# Patient Record
Sex: Female | Born: 1955
Health system: Southern US, Community
[De-identification: ages and names within clinical notes are randomized; demographics above are authoritative.]

## PROBLEM LIST (undated history)

## (undated) DIAGNOSIS — R519 Headache, unspecified: Secondary | ICD-10-CM

## (undated) DIAGNOSIS — K219 Gastro-esophageal reflux disease without esophagitis: Secondary | ICD-10-CM

## (undated) DIAGNOSIS — I2510 Atherosclerotic heart disease of native coronary artery without angina pectoris: Secondary | ICD-10-CM

## (undated) DIAGNOSIS — N63 Unspecified lump in unspecified breast: Secondary | ICD-10-CM

## (undated) DIAGNOSIS — M810 Age-related osteoporosis without current pathological fracture: Secondary | ICD-10-CM

## (undated) DIAGNOSIS — G8929 Other chronic pain: Secondary | ICD-10-CM

## (undated) DIAGNOSIS — R7303 Prediabetes: Secondary | ICD-10-CM

## (undated) DIAGNOSIS — M797 Fibromyalgia: Secondary | ICD-10-CM

## (undated) DIAGNOSIS — E559 Vitamin D deficiency, unspecified: Secondary | ICD-10-CM

## (undated) DIAGNOSIS — E785 Hyperlipidemia, unspecified: Secondary | ICD-10-CM

## (undated) DIAGNOSIS — R2689 Other abnormalities of gait and mobility: Secondary | ICD-10-CM

## (undated) DIAGNOSIS — N8110 Cystocele, unspecified: Secondary | ICD-10-CM

## (undated) DIAGNOSIS — M1990 Unspecified osteoarthritis, unspecified site: Secondary | ICD-10-CM

## (undated) DIAGNOSIS — G4700 Insomnia, unspecified: Secondary | ICD-10-CM

## (undated) DIAGNOSIS — R202 Paresthesia of skin: Secondary | ICD-10-CM

## (undated) DIAGNOSIS — R51 Headache: Secondary | ICD-10-CM

## (undated) DIAGNOSIS — I10 Essential (primary) hypertension: Secondary | ICD-10-CM

## (undated) DIAGNOSIS — H409 Unspecified glaucoma: Secondary | ICD-10-CM

## (undated) HISTORY — DX: Prediabetes: R73.03

## (undated) HISTORY — DX: Vitamin D deficiency, unspecified: E55.9

## (undated) HISTORY — PX: OVARIAN CYST SURGERY: SHX726

## (undated) HISTORY — DX: Other chronic pain: G89.29

## (undated) HISTORY — DX: Fibromyalgia: M79.7

## (undated) HISTORY — DX: Insomnia, unspecified: G47.00

## (undated) HISTORY — DX: Hyperlipidemia, unspecified: E78.5

## (undated) HISTORY — DX: Other abnormalities of gait and mobility: R26.89

## (undated) HISTORY — DX: Unspecified lump in unspecified breast: N63.0

## (undated) HISTORY — PX: APPENDECTOMY: SHX54

## (undated) HISTORY — DX: Cystocele, unspecified: N81.10

## (undated) HISTORY — DX: Gastro-esophageal reflux disease without esophagitis: K21.9

## (undated) HISTORY — DX: Headache, unspecified: R51.9

## (undated) HISTORY — DX: Age-related osteoporosis without current pathological fracture: M81.0

## (undated) HISTORY — PX: BREAST BIOPSY: SHX20

## (undated) HISTORY — DX: Headache: R51

## (undated) HISTORY — DX: Unspecified osteoarthritis, unspecified site: M19.90

## (undated) HISTORY — DX: Unspecified glaucoma: H40.9

## (undated) HISTORY — DX: Paresthesia of skin: R20.2

---

## 2006-10-15 HISTORY — PX: TOTAL VAGINAL HYSTERECTOMY: SHX2548

## 2006-10-15 HISTORY — PX: ABDOMINAL HYSTERECTOMY: SHX81

## 2008-01-22 IMAGING — CR DG ABDOMEN 2V
2 series · 2 of 2 positions shown · non-contrast
Comparison: None

CLINICAL DATA: Vomiting blood.  Evaluate for free air.

ABDOMEN - 2 VIEW

[w abdomen upright]
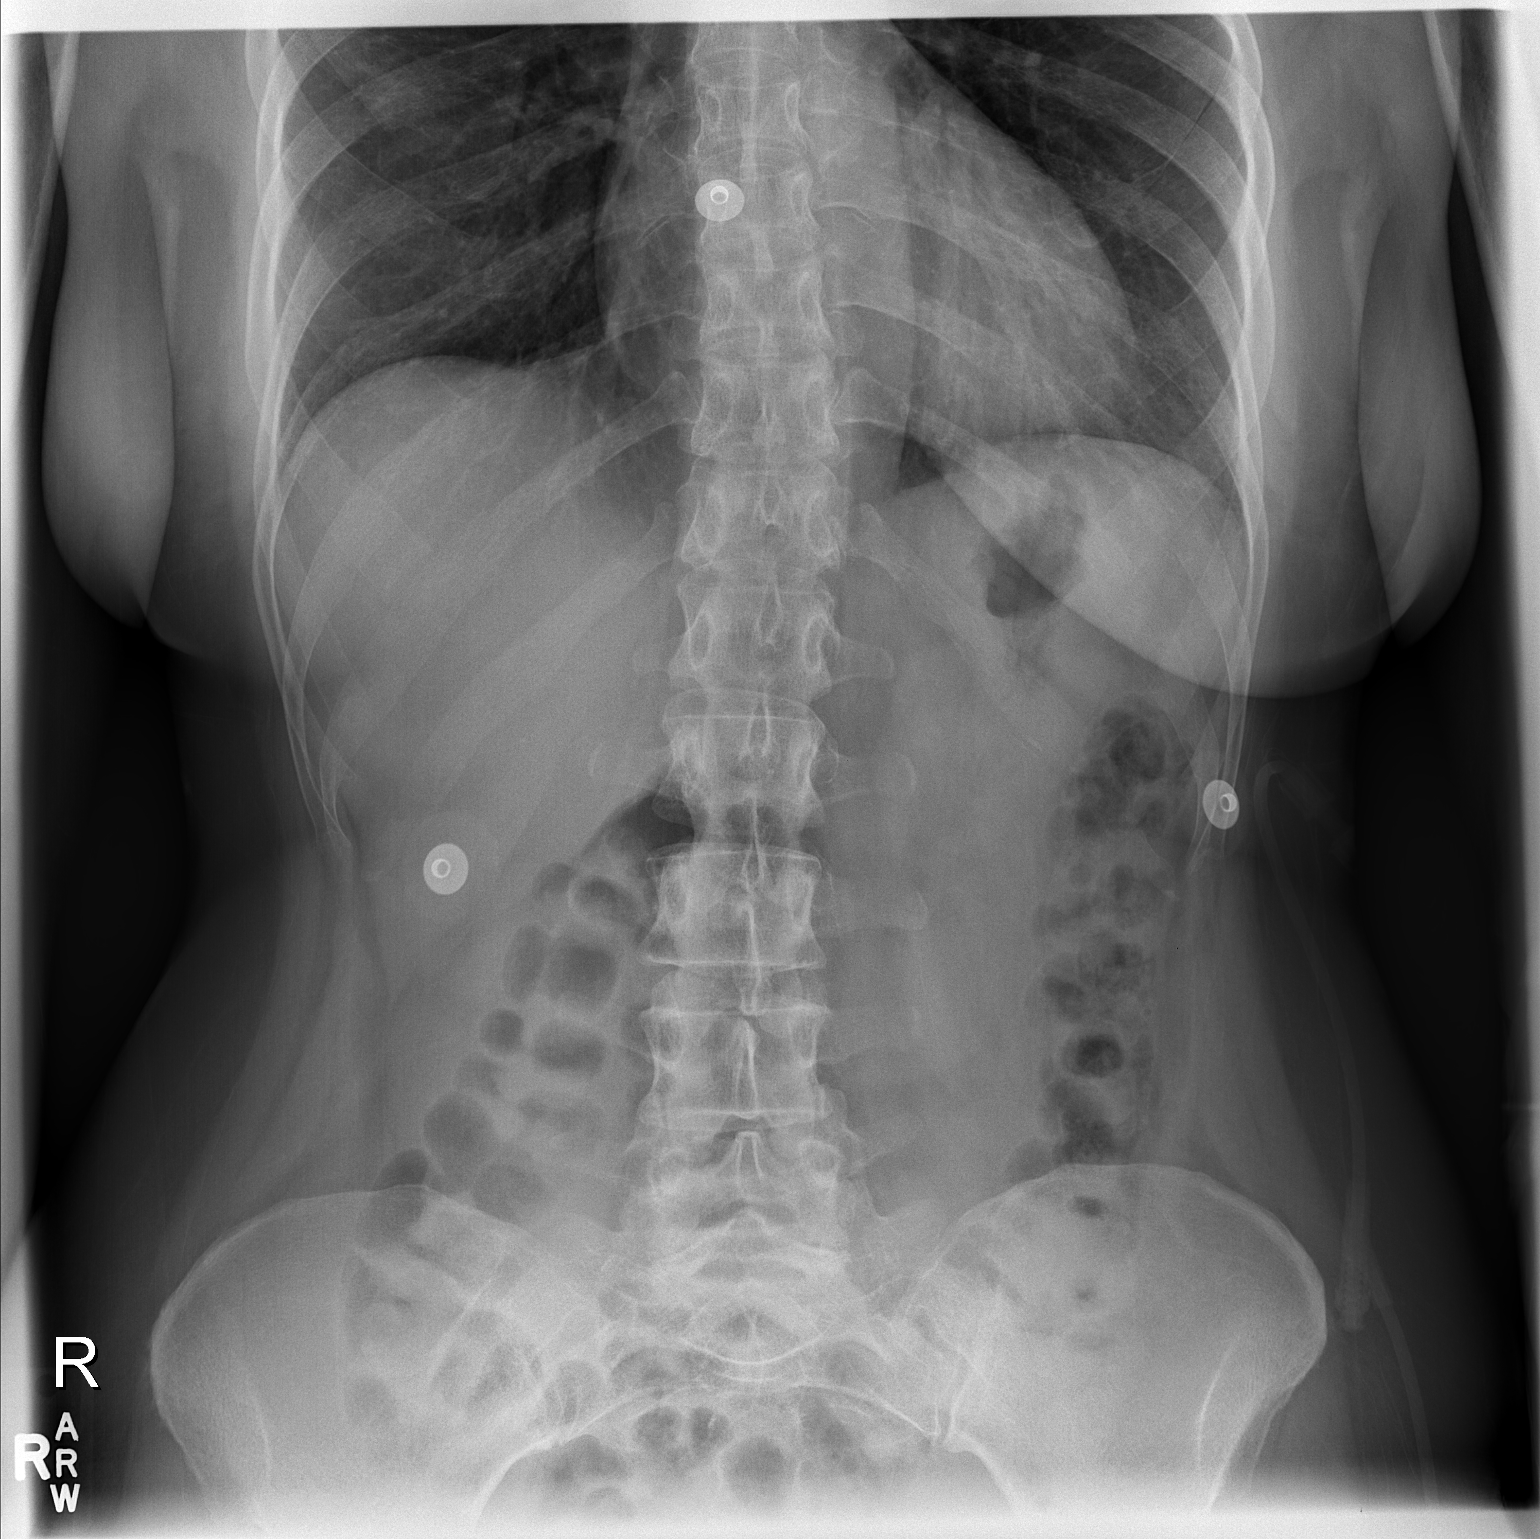

[t abdomen supine]
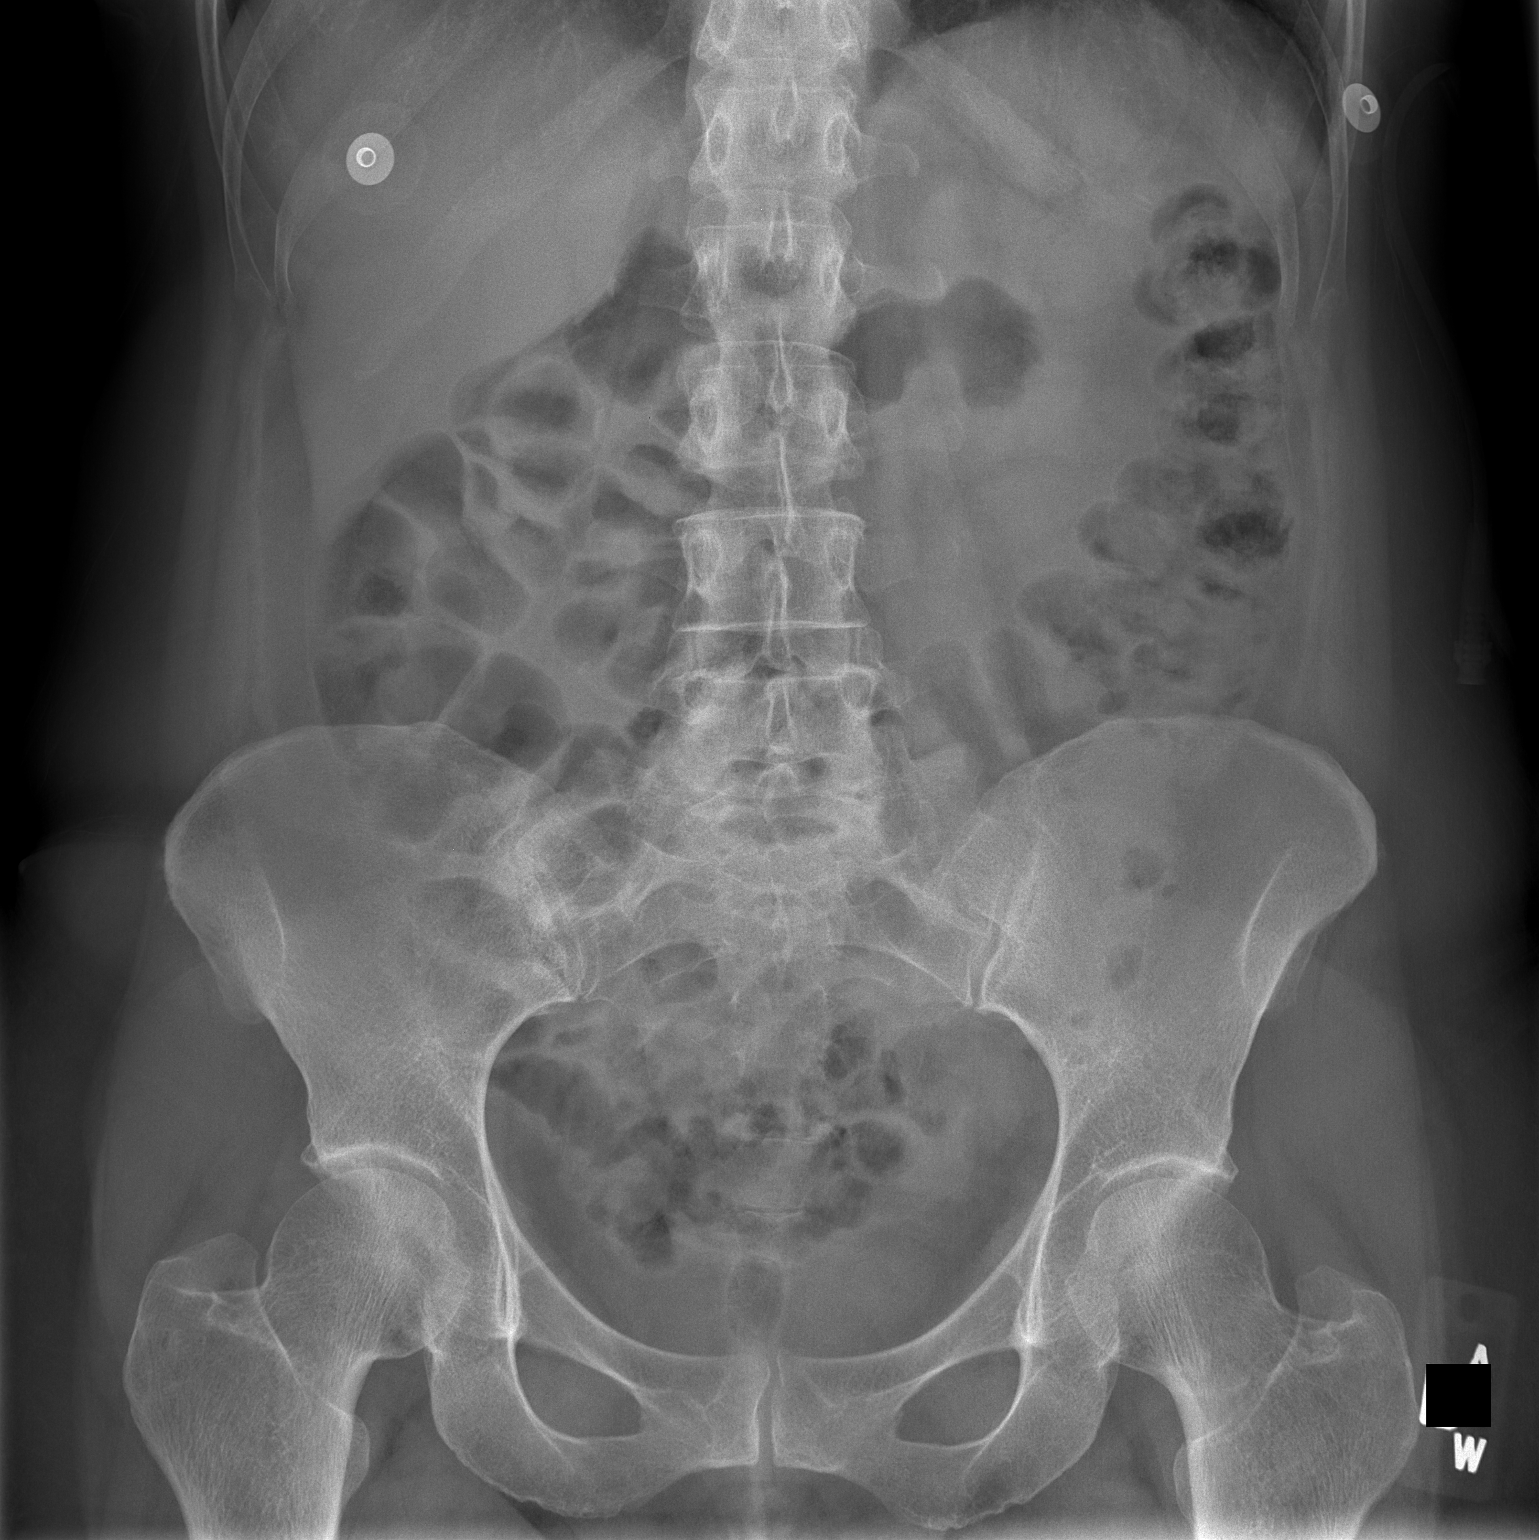

[2 of 2 positions shown; findings below may reference images not displayed]

FINDINGS: Mild osteopenia. No evidence of free intraperitoneal air.
Gas within nondistended loops of large and small bowel.  Mild
cardiomegaly.  No abnormal abdominal calcifications.  Distal gas
and stool.
IMPRESSION: 1.  No acute process in the abdomen.

## 2008-02-04 ENCOUNTER — Inpatient Hospital Stay (HOSPITAL_COMMUNITY): Admission: EM | Admit: 2008-02-04 | Discharge: 2008-02-07 | Payer: Self-pay | Admitting: Emergency Medicine

## 2008-02-05 ENCOUNTER — Encounter: Payer: Self-pay | Admitting: Gastroenterology

## 2008-02-05 IMAGING — CR DG ABDOMEN ACUTE W/ 1V CHEST
3 series · 3 of 3 positions shown · non-contrast
Comparison: [DATE]

CLINICAL DATA: 51-year-old female GI bleed, recent endoscopy

ACUTE ABDOMEN SERIES (ABDOMEN 2 VIEW & CHEST 1 VIEW)

[w chest pa]
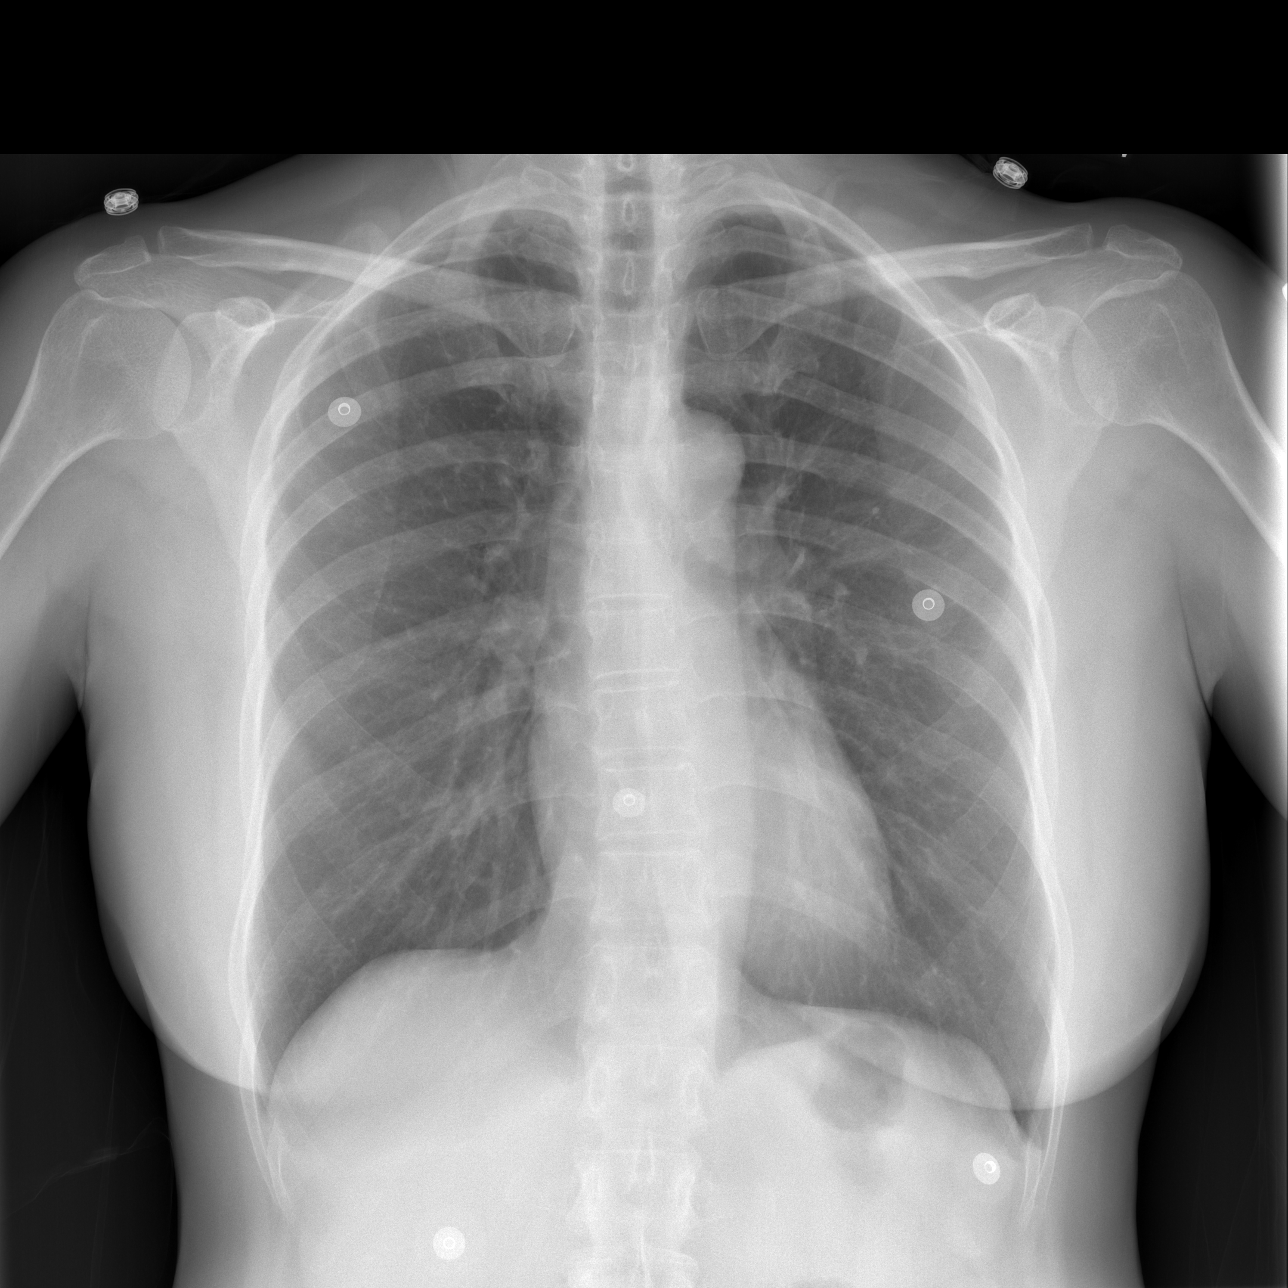

[w abdomen upright *]
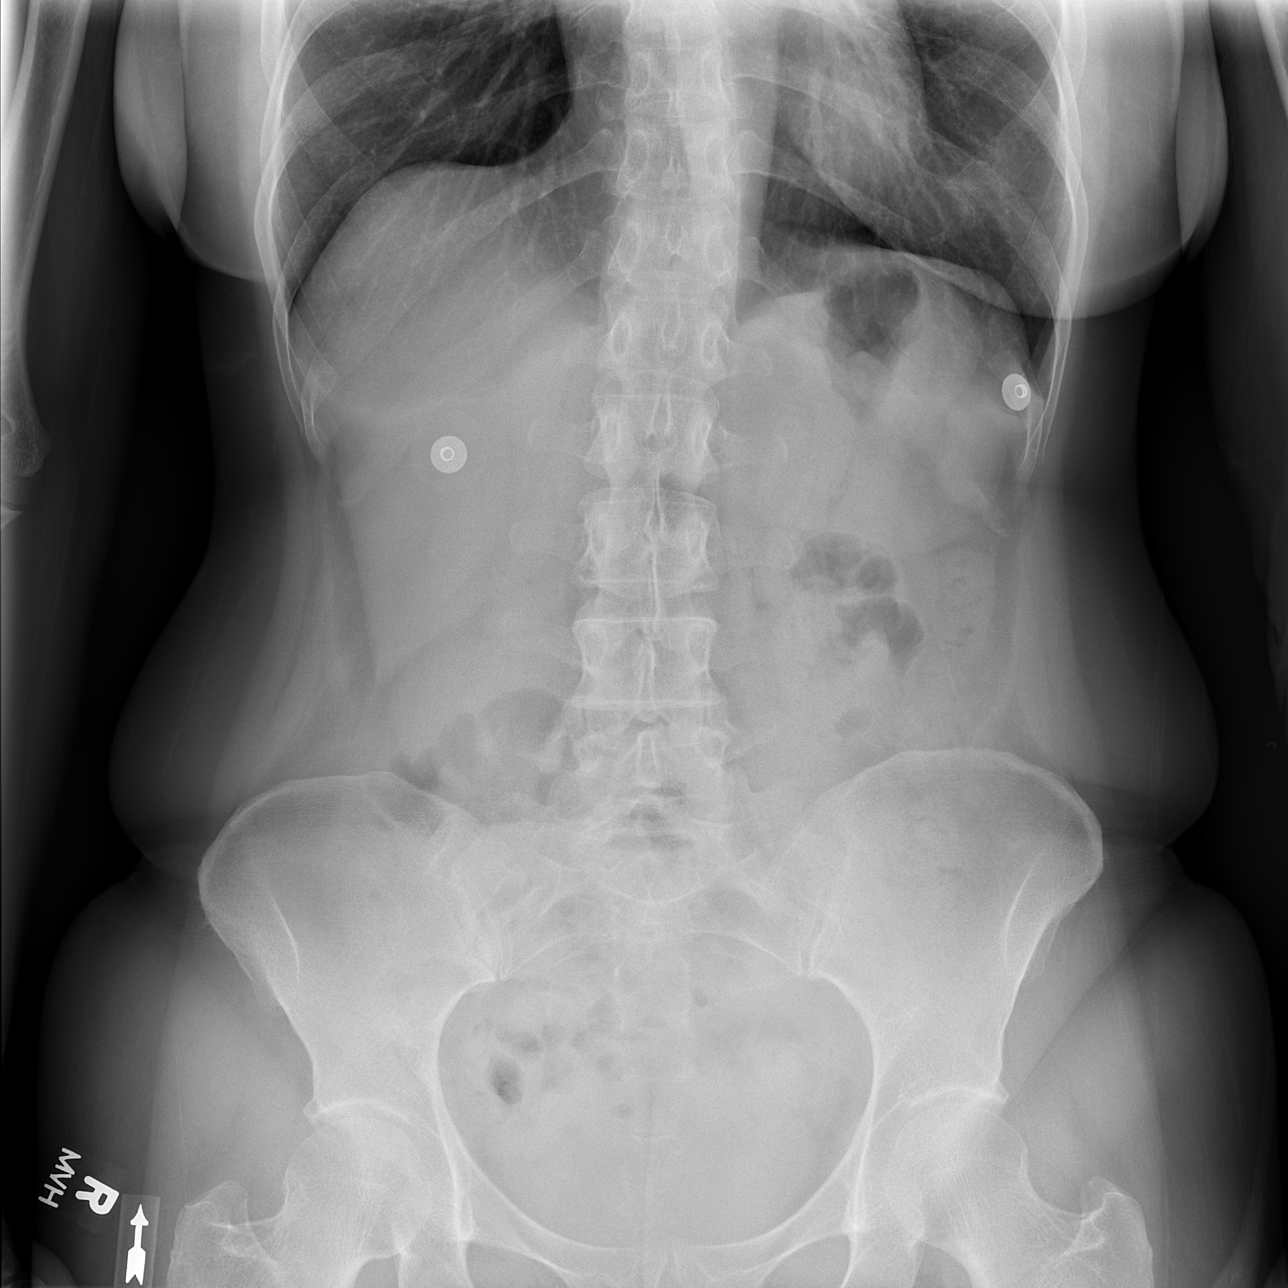

[t abdomen supine]
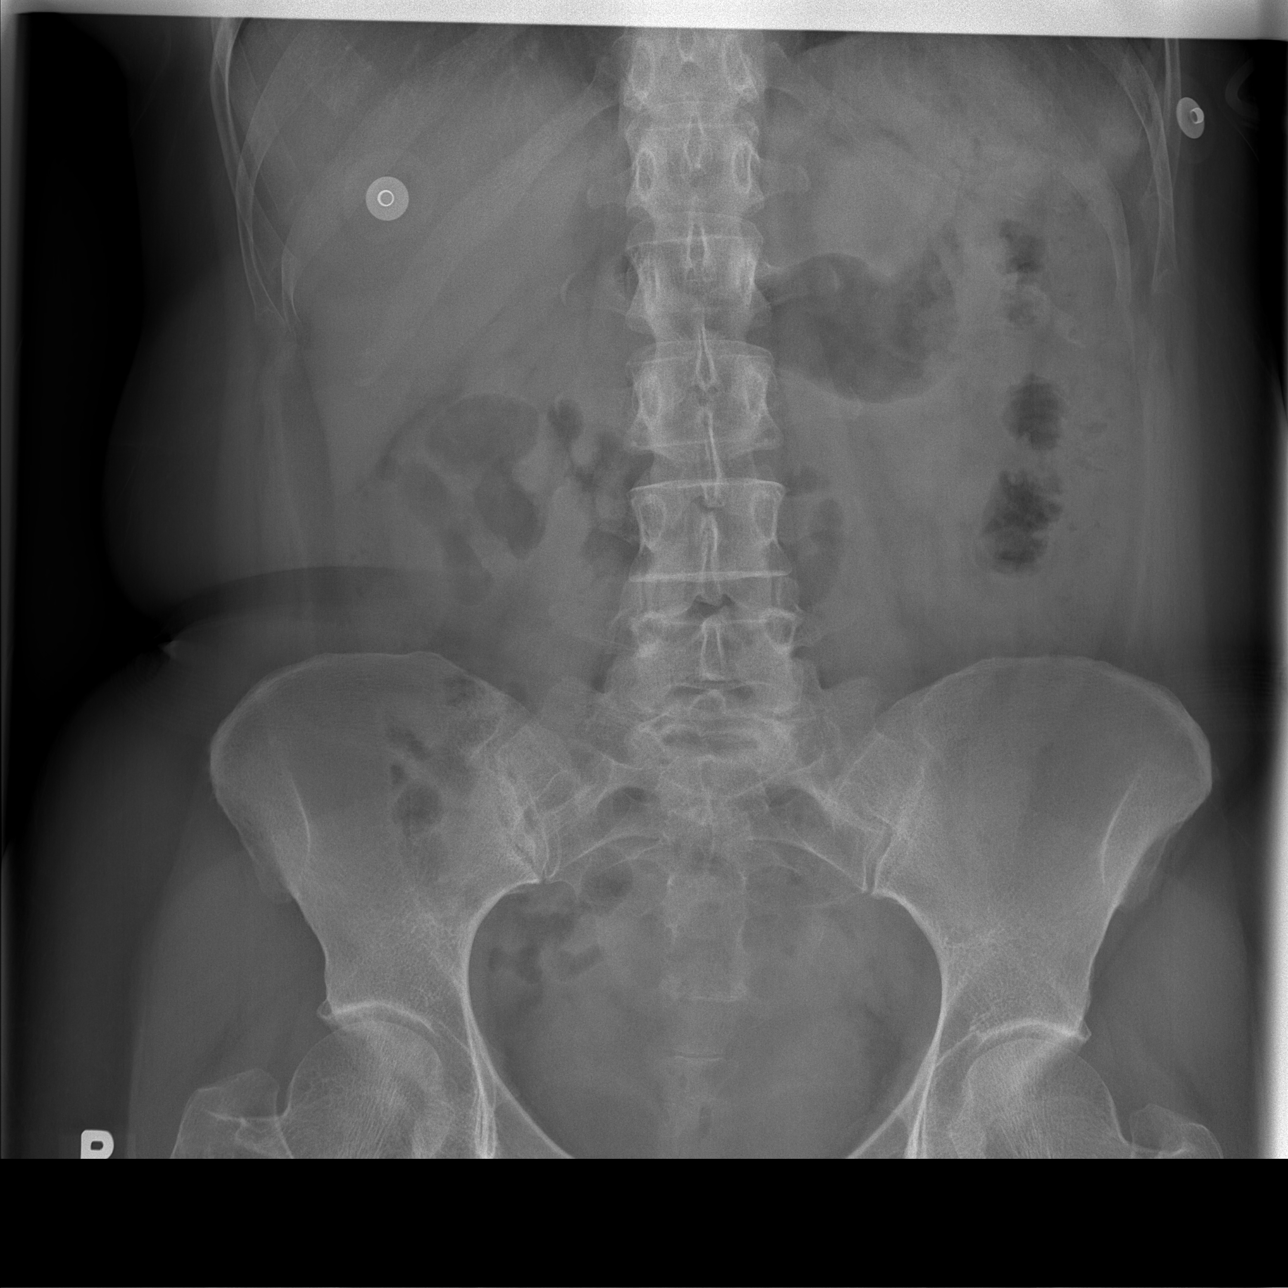

[3 of 3 positions shown; findings below may reference images not displayed]

FINDINGS: Chest:  Lungs are clear.  Normal heart size and
vascularity.  No focal pneumonia, consolidation, edema, effusion or
pneumothorax.  The trachea is midline.

Abdomen:  Nonobstructive bowel gas pattern.  No dilatation or ileus
pattern.  No definite free air.  No abnormal calcifications.
IMPRESSION: No acute findings in the chest or abdomen by plain radiography.

## 2008-02-10 ENCOUNTER — Encounter: Payer: Self-pay | Admitting: Gastroenterology

## 2008-02-10 ENCOUNTER — Encounter (INDEPENDENT_AMBULATORY_CARE_PROVIDER_SITE_OTHER): Payer: Self-pay

## 2008-02-11 ENCOUNTER — Ambulatory Visit: Payer: Self-pay | Admitting: Gastroenterology

## 2010-11-14 NOTE — Procedures (Signed)
Summary: EGD Report/[facility]  EGD Report/[facility]   Imported By: Esmeralda Links D'jimraou 03/16/2008 12:24:21  _____________________________________________________________________  External Attachment:    Type:   Image     Comment:   External Document

## 2010-11-14 NOTE — Miscellaneous (Signed)
Summary: RX-Pylera  Clinical Lists Changes  Medications: Added new medication of PYLERA 140-125-125 MG  CAPS (BIS SUBCIT-METRONID-TETRACYC) 3 by mouth four times a day - Signed Rx of PYLERA 140-125-125 MG  CAPS (BIS SUBCIT-METRONID-TETRACYC) 3 by mouth four times a day;  #120 x 0;  Signed;  Entered by: Darcey Nora RN;  Authorized by: Meryl Dare MD;  Method used: Print then Give to Patient    Prescriptions: PYLERA 140-125-125 MG  CAPS (BIS SUBCIT-METRONID-TETRACYC) 3 by mouth four times a day  #120 x 0   Entered by:   Darcey Nora RN   Authorized by:   Meryl Dare MD   Signed by:   Darcey Nora RN on 02/10/2008   Method used:   Print then Give to Patient   RxID:   [id number]

## 2010-11-14 NOTE — Letter (Signed)
Summary: Richardo Priest letter to patient   [facility]  [address] [place], [place] [address]   Phone: [phone]  Fax: [phone]    02/10/2008  GIULIANNA ROCHA [address] [place], [place]  [address]  Dear Ms. Loletha Carrow,  Enclosed is a perscription for Pylera.  Please take it to your local pharmacy and have filled.  You will take 3 capsules four times a day for 10 days, please also take your omeprazole 40 mg two times a day   . You are being treated for a bacteria in your stomach called H. pylori.  I have enclosed an pamphlet on H. Pylori.    If you have any questions please contact our office and ask to speak to Darcey Nora RN [phone].   Darcey Nora RN  February 10, 2008 2:17 PM        Sincerely,   Darcey Nora RN [facility]

## 2011-02-27 NOTE — H&P (Signed)
NAMESHACONDA, HAJDUK NO.:  [id number]   MEDICAL RECORD NO.:  [id number]          PATIENT TYPE:  INP   LOCATION:                               FACILITY:  [facility]   PHYSICIAN:  Lonia Blood, M.D.DATE OF BIRTH:  1956/03/28   DATE OF ADMISSION:  02/04/2008  DATE OF DISCHARGE:                              HISTORY & PHYSICAL   PRIMARY CARE PHYSICIAN:  Unassigned.   CHIEF COMPLAINT:  Hematemesis with syncope.   HISTORY OF PRESENT ILLNESS:  [name] is a very pleasant 55-  year-old female who divides her time between her home country of  [place] and [place], [place] where her husband lives.  She  has recently returned from a trip to [place].  She was in her usual  state of health until early this morning around 5:30.  She then had an  episode of vomiting.  She noticed large clots of blood in her vomitus.  She has not had this problem before.  She then began to experience some  epigastric pain.  She vomited the second time later in the morning at  approximately 10:00 and noted blood clots in her vomit again.  After her  first episode of vomiting, she suffered an episode of syncope.  She  remembers being in the bathroom and vomiting.  She remembers then  getting up to move back into the house, and the next thing she knew she  woke up in the floor in her bathroom.  She does not know exactly how  long she was out, but she did recover spontaneously and had no chest  pain, palpitations or shortness of breath upon waking.  She also notes  between her episodes of vomiting she also developed two distinct  episodes of diarrhea.  This was marked by dark maroon colored blood as  well.  She has not had any difficulty with GI bleeding of any kind in  the past per her history.  She admits to taking Motrin for chronic  cervical neck pain but states that she only took one 800-mg tablet on  Monday of this week and denies using at any other time within the last  2  weeks.  She denies use of alcohol of any kind.  She denies any  gastroenteritis-type symptoms as of late.  There is no significant  family history of gastric or esophageal cancer as best she can tell.   REVIEW OF SYSTEMS:  Comprehensive review of system is unremarkable with  the exception of positive elements noted in the history of present  illness above.   PAST MEDICAL HISTORY:  Status post hysterectomy in [place] multiple  years ago.   OUTPATIENT MEDICATIONS:  None with exception to occasional over-the-  counter Motrin.   ALLERGIES:  No known drug allergies.   FAMILY HISTORY:  Is noncontributory.   SOCIAL HISTORY:  The patient is married.  She does not smoke.  She does  not drink.  She is originally from [place] and does spend a fair  portion of time there.  Her husband is a [place] citizen and lives  in  [place].   DATA REVIEWED:  Hemoglobin is 11.8 with no known baseline, MCV 92, white  count and platelet count normal.  BMET is totally unremarkable with  exception of elevated BUN at 26 but a normal creatinine.  No x-rays,  EKGs or coags were obtained.  There is no lipase available.   PHYSICAL EXAMINATION:  Temperature 97.1, blood pressure 116/47, heart  rate 127, respiratory rate 18, O2 saturation 97% on room air.  GENERAL:  Well-developed, well-nourished, Hispanic female in no acute  respiratory distress.  HEENT:  Normocephalic, atraumatic.  Pupils equal, round, reactive to  light and accommodation.  Extraocular muscles intact bilaterally.  OC/OP  clear.  NECK:  No JVD.  LUNGS: Clear to auscultation bilaterally without wheezes or rhonchi.  CARDIOVASCULAR:  Tachycardic but regular without gallop or rub with  normal S1-S2 and no appreciable murmur.  ABDOMEN:  Tender to deep palpation in the epigastrium.  There is no mass  appreciable.  There is no rebound.  There is no ascites.  Bowel sounds  are positive.  EXTREMITIES:  No significant cyanosis, clubbing, edema  bilateral lower  extremities.  NEUROLOGIC:  Nonfocal neurologic exam.  RECTAL:  Black stool which is guaiac positive per EDP - not repeated.   IMPRESSION AND PLAN:  1. Probable upper gastrointestinal bleed - the exact etiology of Ms.      Trueba bleeding is not clear.  At this point, she does appear to      be hemodynamically stable.  She is tachycardic, however, and she      did suffer a syncopal episode which causes me to be somewhat      concerned that she could be bleeding more briskly than is obvious      at present time.  She has been hydrated aggressively, and her blood      pressure is presently stable.  We will place her on a telemetry      bed.  Will monitor her CBCs on a q.8h. basis.  We will type and      cross and hold 2 units of packed red blood cells in case she does      drop significantly with ongoing hydration.  I have contacted      [facility] and asked them to evaluate the patient.  I have told      them that I will work to stabilize her overnight and that it will      be sufficient for them to see her in the morning on February 05, 2008      unless I call to request earlier evaluation.  She will be kept on      clear liquids only during the day and will be made n.p.o. after      midnight in anticipation of possible EGD in the morning.  At this      point, it does not appear to me that she has consumed enough Motrin      to explain her bleeding.  I will obtain a KUB to rule out free air.      I will also check a lipase to assure there is no evidence of      pancreatitis.  CMET will be obtained to assure that the patient's      LFTs are normal, and coags will also be checked.  2. Syncope - the patient's syncope sounds to be more of a vasovagal      type syncope, probably related  to her vomiting.  At this point, I      will check a TSH.  Should she have any further symptoms, however, I      will consider an echocardiogram during her hospital stay.  I will      also  check a UA with micro and culture to assure there is no      underlying urinary tract infection which could potentially have led      to vomiting which could lead to a Mallory-Weiss tear.  3. Normocytic anemia - the patient is bleeding at the moment, and this      appears to be the etiology.  I will check an anemia panel, however,      to give me an indication of the possible chronicity of the      patient's bleeding and to rule out a mixed picture with B12 or      folate deficiency.      Lonia Blood, M.D.  Electronically Signed     JTM/MEDQ  D:  02/04/2008  T:  02/04/2008  Job:  [id number]

## 2011-02-27 NOTE — Discharge Summary (Signed)
Amy Cline, BARIA NO.:  [id number]   MEDICAL RECORD NO.:  [id number]          PATIENT TYPE:  INP   LOCATION:  1424                         [facility]ACILITY[facility]   PHYSICIAN:  Eduard Clos, MDDATE OF BIRTH:  Oct 29, 1955   DATE OF ADMISSION:  02/04/2008  DATE OF DISCHARGE:                               DISCHARGE SUMMARY   This is a 55 year old female who presented to the ER having had a  vomitus which was high in blood clots. She was admitted to the medical  floor. The patient also had an episode of syncope following vomiting at  home. The syncope did not recur in the hospital, and it was felt  secondary to vasovagal. The patient was started on IV fluids and IV  Protonix, and a GI consult was obtained. Per GI, the patient underwent  EGD which showed a gastric ulcer with acute hemorrhage for which the  patient was given epinephrine injections and her was successful. The  patient was transferred back to the floor. The patient did have some  abdominal pain for which an acute abdominal series was done. It did not  show anything acute. The patient was placed on liquid diet and advanced  to full liquid diet. The patient's biopsy of the gastric ulcer showed  positive for H. pylori for which Biaxin and Flagyl along with PPI use  were started. At this time the patient is tolerating full liquid diet  and will advance to a regular diet and if tolerated will be discharging  home. The patient will need a repeat EGD by Dr. Russella Dar,  gastroenterologist, and at this time the patient is hemodynamically  stable.   DISCHARGE DIAGNOSES:  1. Acute upper gastrointestinal bleed secondary to gastric ulcer.  2. Anemia secondary to gastrointestinal bleed.  3. Helicobacter pylori positive.  4. Abdominal pain.  5. Syncope secondary to vasovagal.   MEDICATIONS AT DISCHARGE:  1. Biaxin 500 mg p.o. twicw daily for 13 more days.  2. Flagyl 250 mg p.o. 4 times a day for 14 more days.  3.  Omeprazole 40 mg p.o. twice daily.   PLAN:  The patient was advised to follow with her primary care physician  within 3 days and recheck a basic metabolic panel and CBC. The patient  was advised to avoid any NSAIDs. The patient was advised to take a  complete course of medication and to follow with Dr. Russella Dar,  gastroenterologist, in 2 weeks. She will need a repeat EGD in 8 weeks.  Pending pathology, to follow with her primary care physician,  gastroenterologist.      Eduard Clos, MD  Electronically Signed     ANK/MEDQ  D:  02/07/2008  T:  02/07/2008  Job:  [id number]

## 2011-07-10 LAB — CK TOTAL AND CKMB (NOT AT ARMC)
CK, MB: 1
Relative Index: INVALID
Total CK: 47

## 2011-07-10 LAB — CROSSMATCH
ABO/RH(D): O POS
Antibody Screen: NEGATIVE

## 2011-07-10 LAB — CBC
HCT: 27.5 — ABNORMAL LOW
HCT: 28.1 — ABNORMAL LOW
HCT: 28.2 — ABNORMAL LOW
HCT: 28.8 — ABNORMAL LOW
HCT: 28.8 — ABNORMAL LOW
HCT: 29.2 — ABNORMAL LOW
HCT: 30.6 — ABNORMAL LOW
HCT: 31.2 — ABNORMAL LOW
HCT: 32.2 — ABNORMAL LOW
HCT: 34.4 — ABNORMAL LOW
Hemoglobin: 10 — ABNORMAL LOW
Hemoglobin: 10.6 — ABNORMAL LOW
Hemoglobin: 10.8 — ABNORMAL LOW
Hemoglobin: 10.9 — ABNORMAL LOW
Hemoglobin: 11.8 — ABNORMAL LOW
Hemoglobin: 9.5 — ABNORMAL LOW
Hemoglobin: 9.7 — ABNORMAL LOW
Hemoglobin: 9.7 — ABNORMAL LOW
Hemoglobin: 9.8 — ABNORMAL LOW
Hemoglobin: 9.8 — ABNORMAL LOW
MCHC: 33.7
MCHC: 34.1
MCHC: 34.1
MCHC: 34.3
MCHC: 34.4
MCHC: 34.4
MCHC: 34.4
MCHC: 34.5
MCHC: 34.7
MCHC: 34.8
MCV: 91.4
MCV: 91.7
MCV: 92
MCV: 92
MCV: 92
MCV: 92.1
MCV: 92.1
MCV: 92.2
MCV: 92.3
MCV: 93.3
Platelets: 260
Platelets: 263
Platelets: 269
Platelets: 270
Platelets: 284
Platelets: 300
Platelets: 303
Platelets: 303
Platelets: 320
Platelets: 322
RBC: 2.98 — ABNORMAL LOW
RBC: 3.05 — ABNORMAL LOW
RBC: 3.07 — ABNORMAL LOW
RBC: 3.13 — ABNORMAL LOW
RBC: 3.13 — ABNORMAL LOW
RBC: 3.18 — ABNORMAL LOW
RBC: 3.32 — ABNORMAL LOW
RBC: 3.41 — ABNORMAL LOW
RBC: 3.45 — ABNORMAL LOW
RBC: 3.74 — ABNORMAL LOW
RDW: 13.2
RDW: 13.2
RDW: 13.2
RDW: 13.3
RDW: 13.3
RDW: 13.3
RDW: 13.4
RDW: 13.5
RDW: 13.5
RDW: 13.5
WBC: 10.4
WBC: 4.6
WBC: 4.7
WBC: 4.9
WBC: 5.1
WBC: 5.8
WBC: 5.9
WBC: 6.6
WBC: 8.1
WBC: 9.1

## 2011-07-10 LAB — CULTURE, BLOOD (ROUTINE X 2)
Culture: NO GROWTH
Culture: NO GROWTH

## 2011-07-10 LAB — BASIC METABOLIC PANEL
BUN: 26 — ABNORMAL HIGH
CO2: 28
Calcium: 8.9
Chloride: 104
Creatinine, Ser: 0.65
GFR calc Af Amer: >60
GFR calc non Af Amer: >60
Glucose, Bld: 112 — ABNORMAL HIGH
Potassium: 4.2
Sodium: 138

## 2011-07-10 LAB — CLOTEST (H. PYLORI), BIOPSY: Helicobacter screen: POSITIVE — AB

## 2011-07-10 LAB — RETICULOCYTES
RBC.: 3.47 — ABNORMAL LOW
Retic Count, Absolute: 45.1
Retic Ct Pct: 1.3

## 2011-07-10 LAB — URINALYSIS, ROUTINE W REFLEX MICROSCOPIC
Bilirubin Urine: NEGATIVE
Glucose, UA: NEGATIVE
Hgb urine dipstick: NEGATIVE
Nitrite: NEGATIVE
Protein, ur: NEGATIVE
Specific Gravity, Urine: 1.017
Urobilinogen, UA: 0.2
pH: 7

## 2011-07-10 LAB — VITAMIN B12: Vitamin B-12: 791 (ref 211–911)

## 2011-07-10 LAB — URINE CULTURE
Colony Count: NO GROWTH
Culture: NO GROWTH

## 2011-07-10 LAB — MAGNESIUM: Magnesium: 1.8

## 2011-07-10 LAB — ABO/RH: ABO/RH(D): O POS

## 2011-07-10 LAB — HEPATIC FUNCTION PANEL
ALT: 19
AST: 19
Albumin: 3.4 — ABNORMAL LOW
Alkaline Phosphatase: 103
Bilirubin, Direct: 0.1
Indirect Bilirubin: 0.7
Total Bilirubin: 0.8
Total Protein: 6.8

## 2011-07-10 LAB — HEMOGLOBIN AND HEMATOCRIT, BLOOD
HCT: 28.2 — ABNORMAL LOW
Hemoglobin: 9.8 — ABNORMAL LOW

## 2011-07-10 LAB — LIPASE, BLOOD: Lipase: 24

## 2011-07-10 LAB — IRON AND TIBC
Iron: 73
Saturation Ratios: 27
TIBC: 266
UIBC: 193

## 2011-07-10 LAB — TROPONIN I: Troponin I: 0.04

## 2011-07-10 LAB — PHOSPHORUS: Phosphorus: 3.5

## 2011-07-10 LAB — TSH: TSH: 0.618

## 2011-07-10 LAB — APTT: aPTT: 29

## 2011-07-10 LAB — FOLATE: Folate: 13.6

## 2011-07-10 LAB — FERRITIN: Ferritin: 45 (ref 10–291)

## 2011-07-10 LAB — PROTIME-INR
INR: 1
Prothrombin Time: 13.5

## 2012-07-13 IMAGING — CR DG THORACIC SPINE 2V
3 series · 3 of 3 positions shown · non-contrast
Comparison: None.

CLINICAL DATA: Fall.  Thoracic back pain.

THORACIC SPINE - 2 VIEW

[t thoracic spine ap]
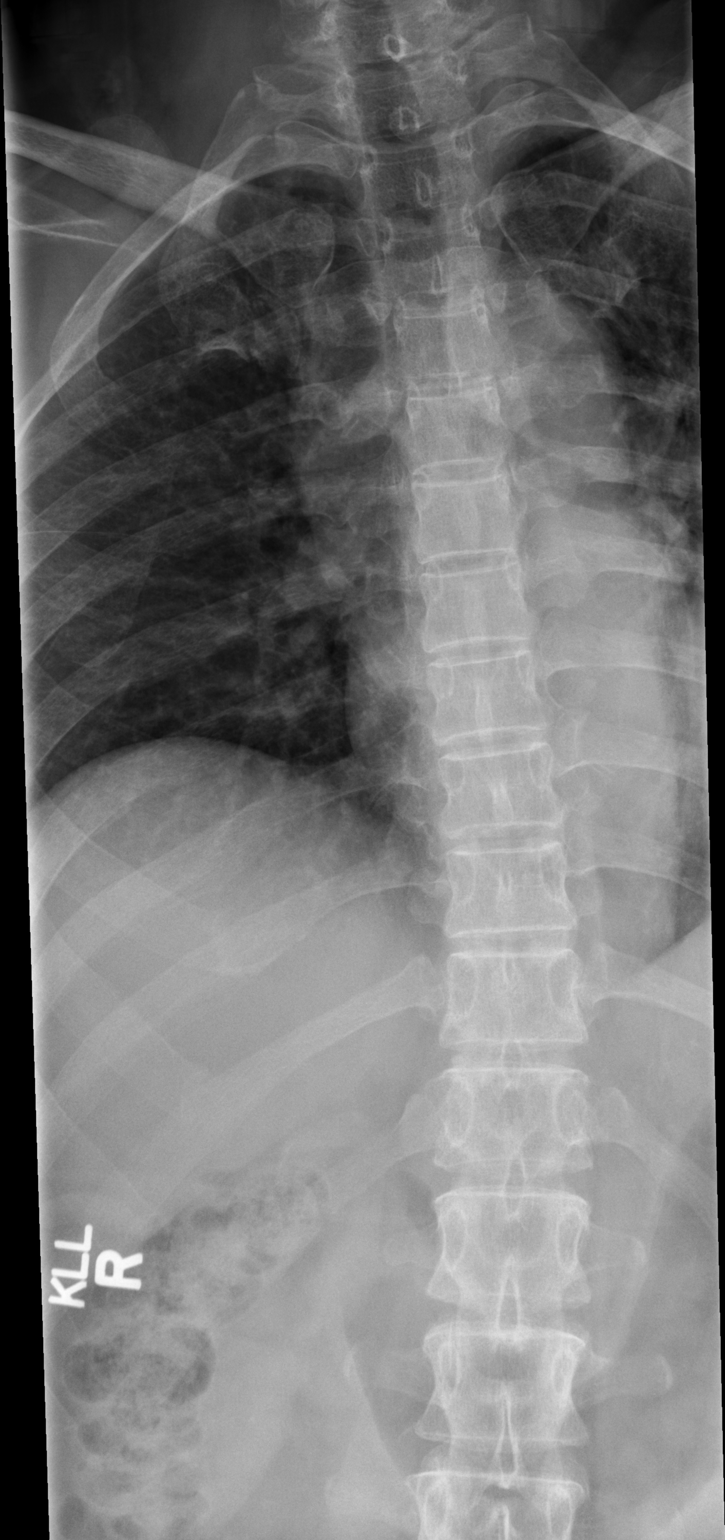

[t thoracic spine lat]
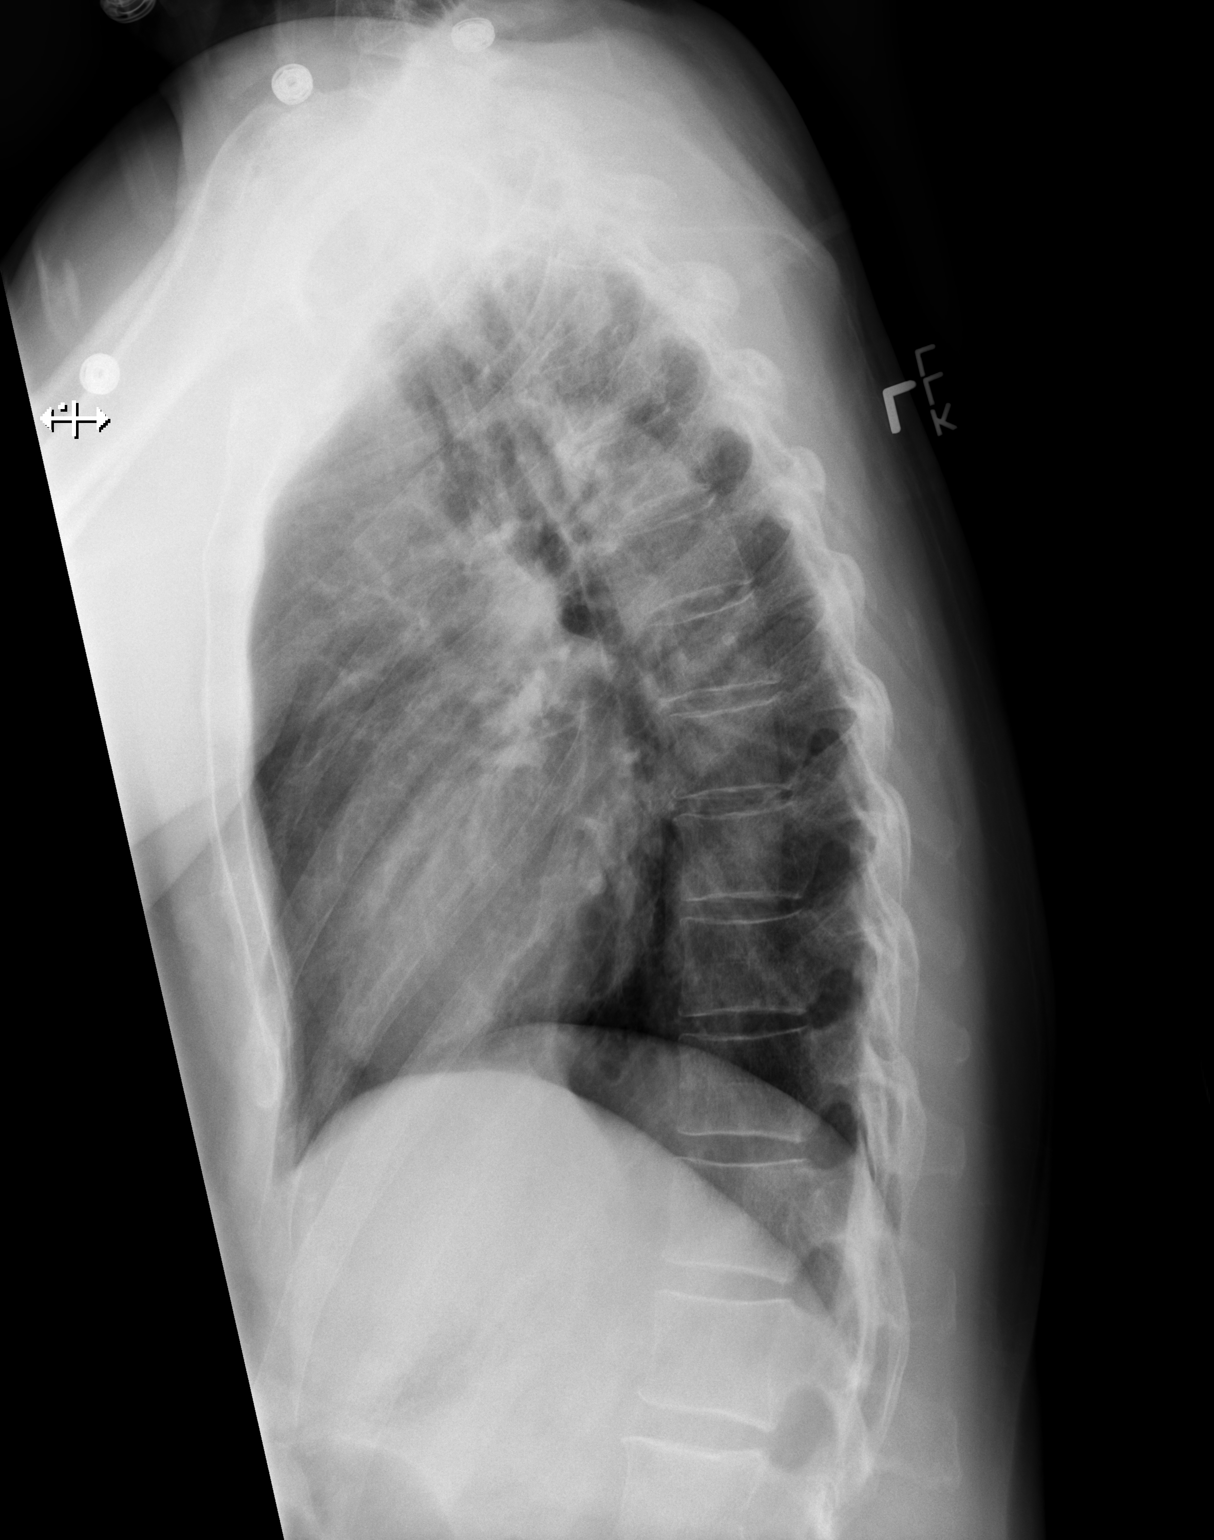

[t thoracic swimmers]
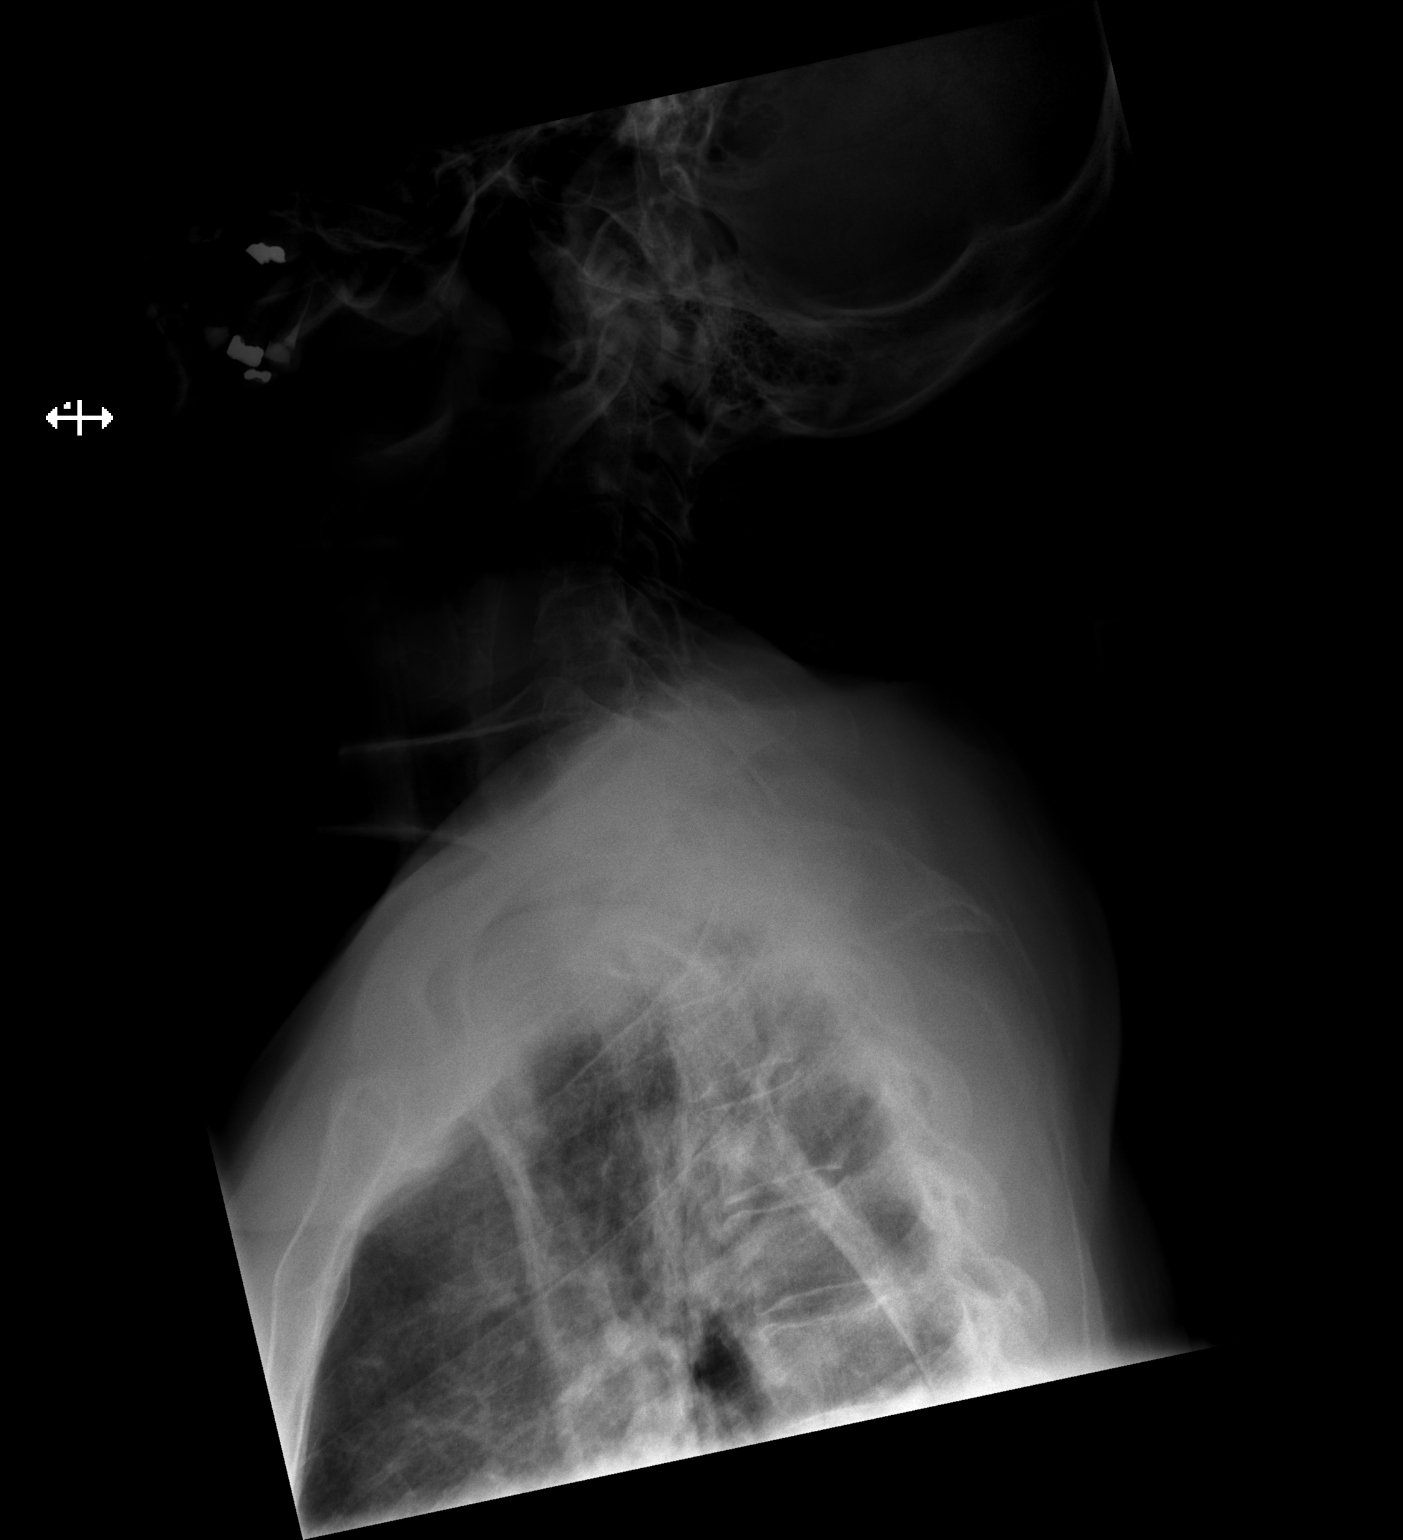

[3 of 3 positions shown; findings below may reference images not displayed]

FINDINGS: There is no evidence of thoracic spine fracture.
Alignment is normal.  No other significant bone abnormalities are
identified.
IMPRESSION: Negative.

## 2012-07-26 ENCOUNTER — Emergency Department (HOSPITAL_COMMUNITY): Payer: No Typology Code available for payment source

## 2012-07-26 ENCOUNTER — Emergency Department (HOSPITAL_COMMUNITY)
Admission: EM | Admit: 2012-07-26 | Discharge: 2012-07-26 | Disposition: A | Discharge status: Home / Self Care | Payer: No Typology Code available for payment source | Attending: Emergency Medicine | Admitting: Emergency Medicine

## 2012-07-26 ENCOUNTER — Encounter (HOSPITAL_COMMUNITY): Payer: Self-pay | Admitting: Emergency Medicine

## 2012-07-26 DIAGNOSIS — M546 Pain in thoracic spine: Secondary | ICD-10-CM | POA: Insufficient documentation

## 2012-07-26 DIAGNOSIS — M79609 Pain in unspecified limb: Secondary | ICD-10-CM | POA: Insufficient documentation

## 2012-07-26 DIAGNOSIS — W19XXXA Unspecified fall, initial encounter: Secondary | ICD-10-CM

## 2012-07-26 DIAGNOSIS — I2510 Atherosclerotic heart disease of native coronary artery without angina pectoris: Secondary | ICD-10-CM | POA: Insufficient documentation

## 2012-07-26 DIAGNOSIS — I10 Essential (primary) hypertension: Secondary | ICD-10-CM | POA: Insufficient documentation

## 2012-07-26 DIAGNOSIS — M542 Cervicalgia: Secondary | ICD-10-CM | POA: Insufficient documentation

## 2012-07-26 DIAGNOSIS — M545 Low back pain, unspecified: Secondary | ICD-10-CM | POA: Insufficient documentation

## 2012-07-26 DIAGNOSIS — T148XXA Other injury of unspecified body region, initial encounter: Secondary | ICD-10-CM | POA: Insufficient documentation

## 2012-07-26 DIAGNOSIS — W010XXA Fall on same level from slipping, tripping and stumbling without subsequent striking against object, initial encounter: Secondary | ICD-10-CM | POA: Insufficient documentation

## 2012-07-26 DIAGNOSIS — Y9229 Other specified public building as the place of occurrence of the external cause: Secondary | ICD-10-CM | POA: Insufficient documentation

## 2012-07-26 DIAGNOSIS — M25559 Pain in unspecified hip: Secondary | ICD-10-CM | POA: Insufficient documentation

## 2012-07-26 HISTORY — DX: Atherosclerotic heart disease of native coronary artery without angina pectoris: I25.10

## 2012-07-26 HISTORY — DX: Essential (primary) hypertension: I10

## 2012-07-26 IMAGING — CR DG ELBOW COMPLETE 3+V*L*
4 series · 4 of 4 positions shown · non-contrast
Comparison: No priors.

CLINICAL DATA: History of fall complaining of left elbow pain.

LEFT ELBOW - COMPLETE 3+ VIEW

[x elbow ap left]
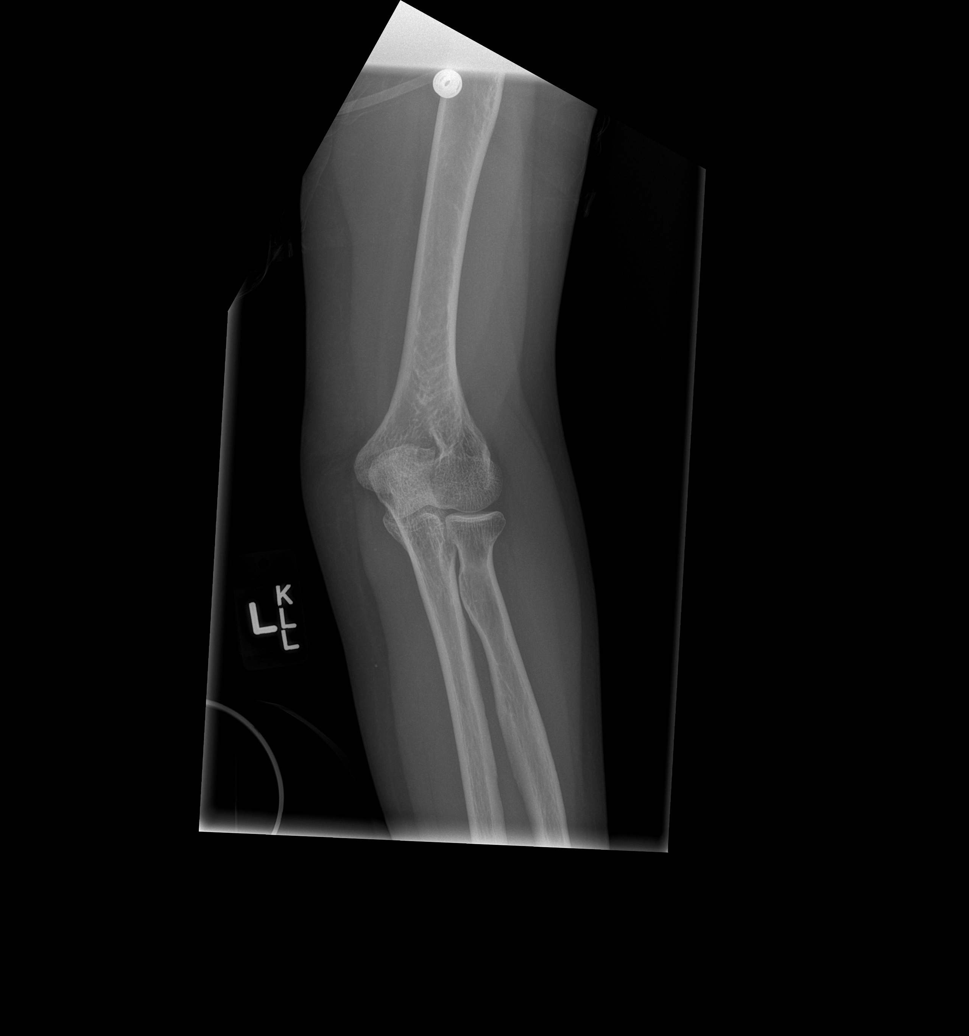

[x elbow obl left (1 of 2)]
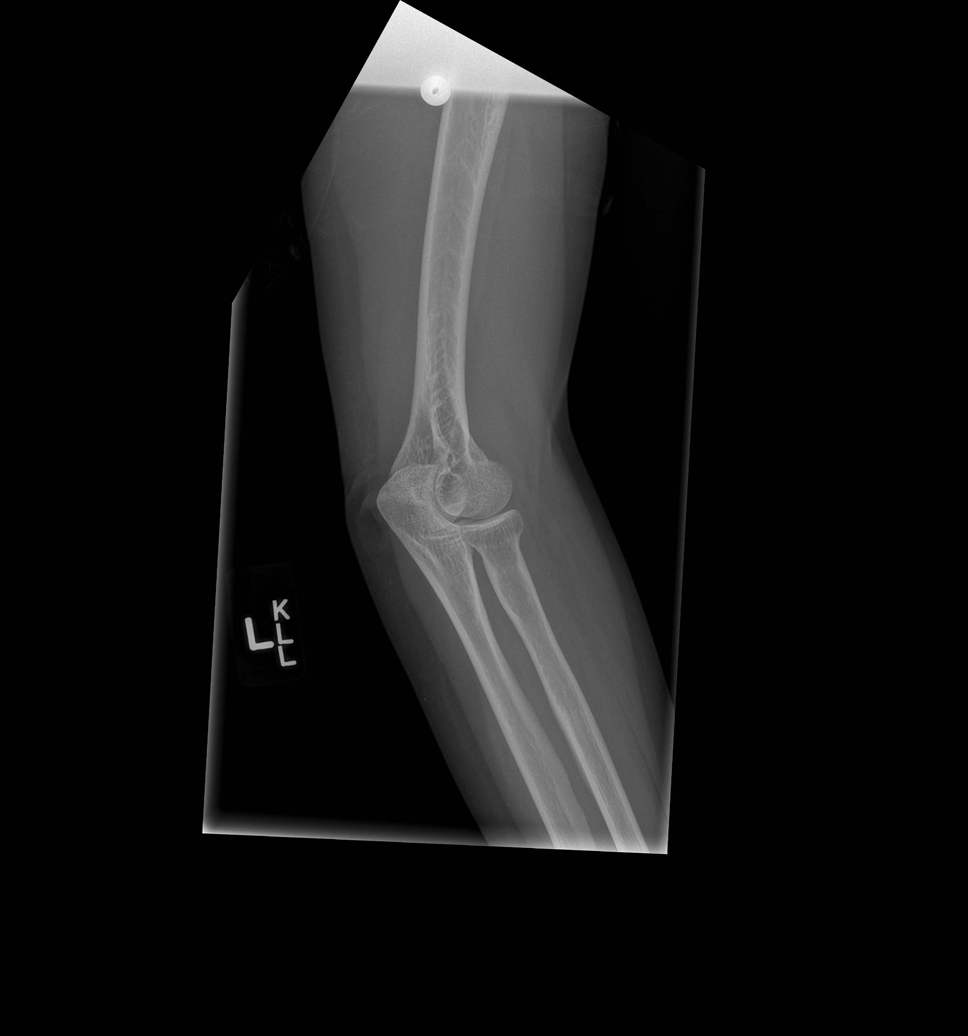

[x elbow obl left (2 of 2)]
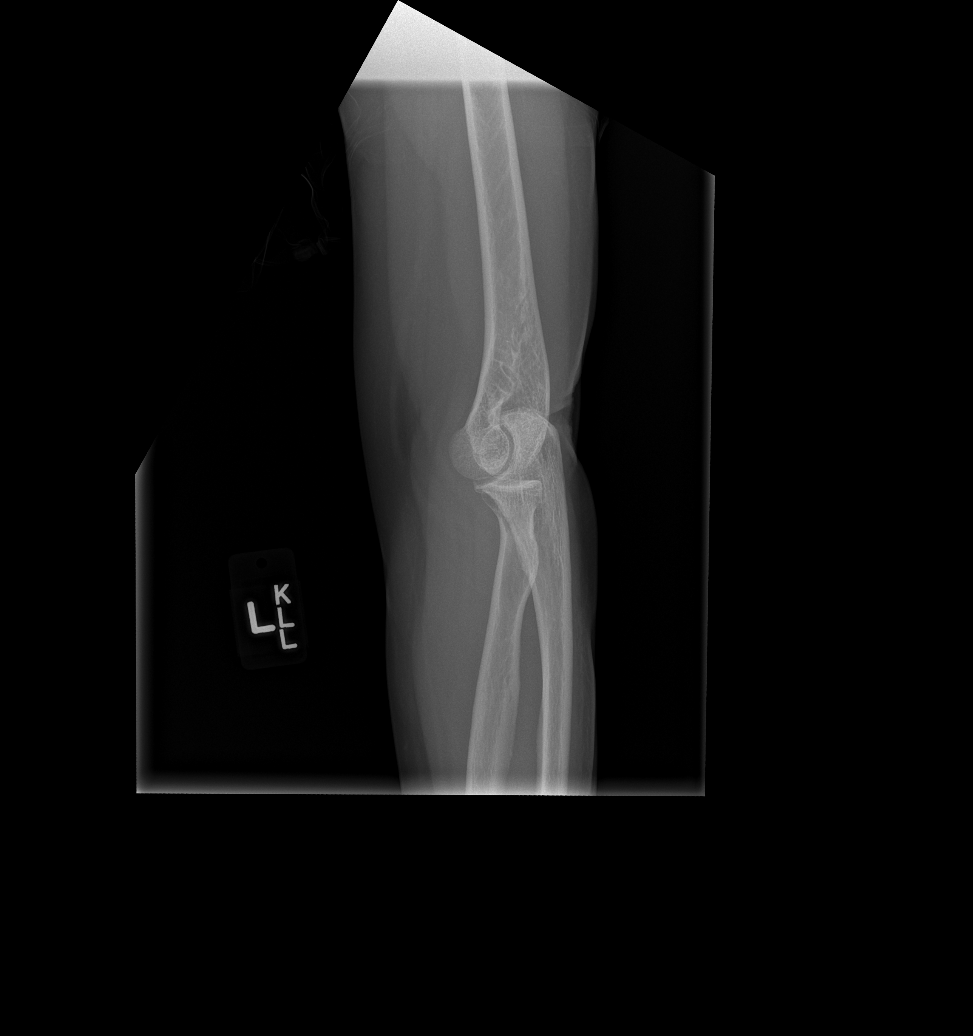

[x elbow lat left]
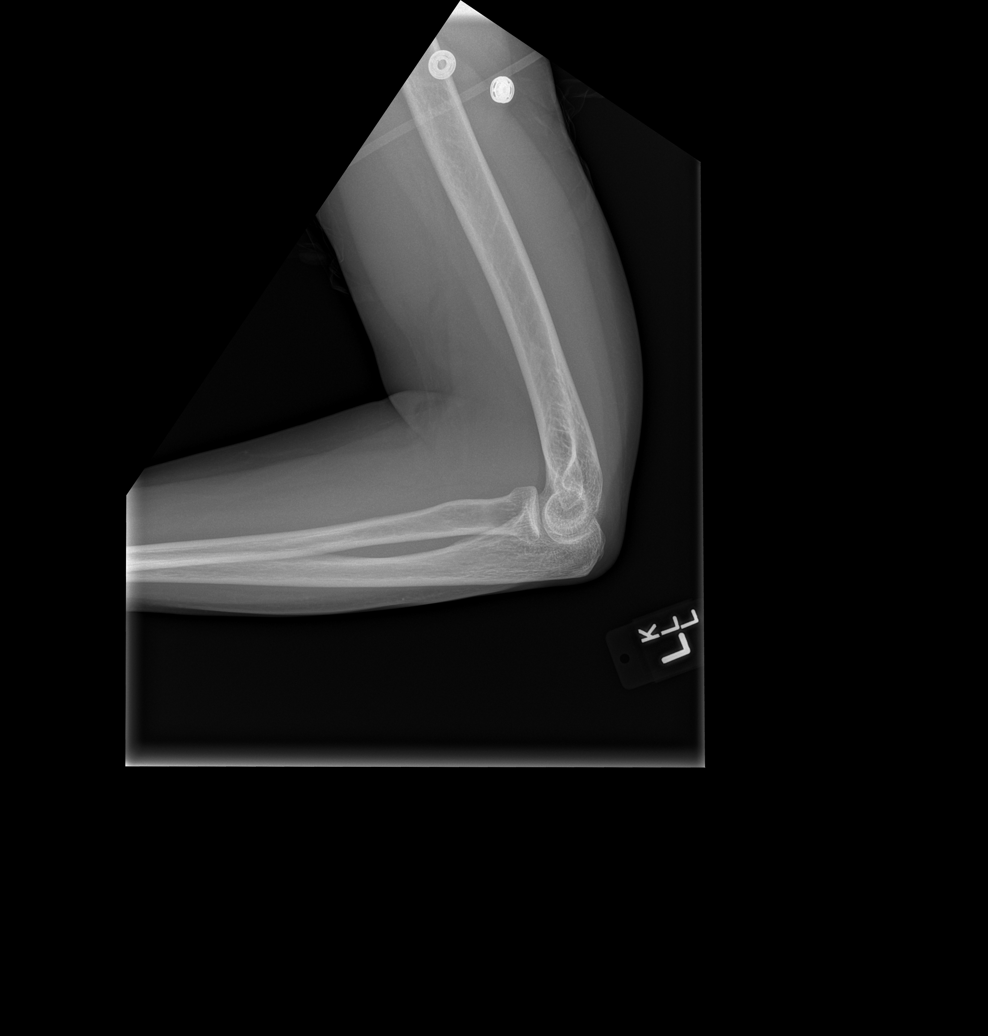

[4 of 4 positions shown; findings below may reference images not displayed]

FINDINGS: Four views of the left elbow demonstrate no acute
fracture, subluxation, dislocation, joint or soft tissue
abnormality.
IMPRESSION: 1.  No acute radiographic abnormality of the left elbow.

## 2012-07-26 IMAGING — CR DG LUMBAR SPINE COMPLETE 4+V
5 series · 5 of 5 positions shown · non-contrast
Comparison: None.

CLINICAL DATA: Fall.  Low back pain.

LUMBAR SPINE - COMPLETE 4+ VIEW

[t lumbar spine ap]
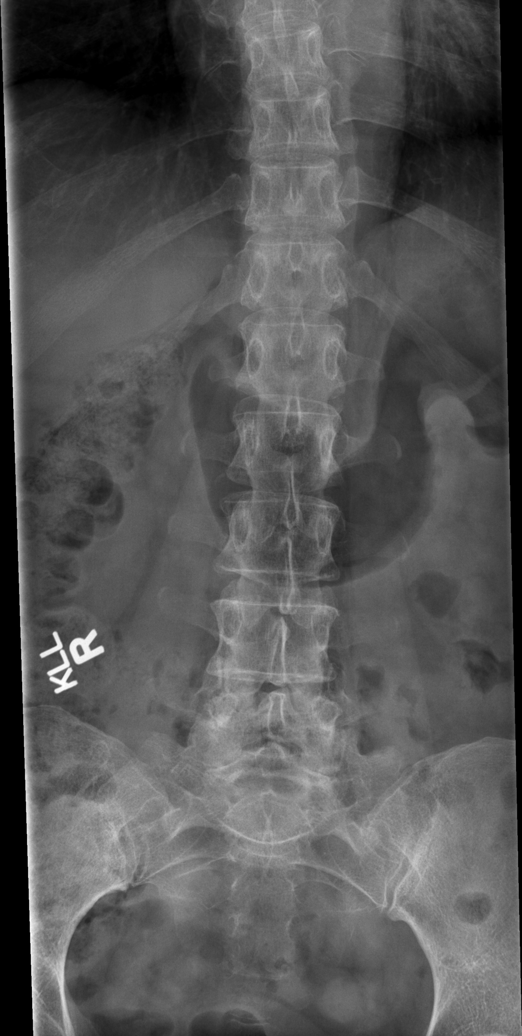

[t lumbar spine obl (1 of 2)]
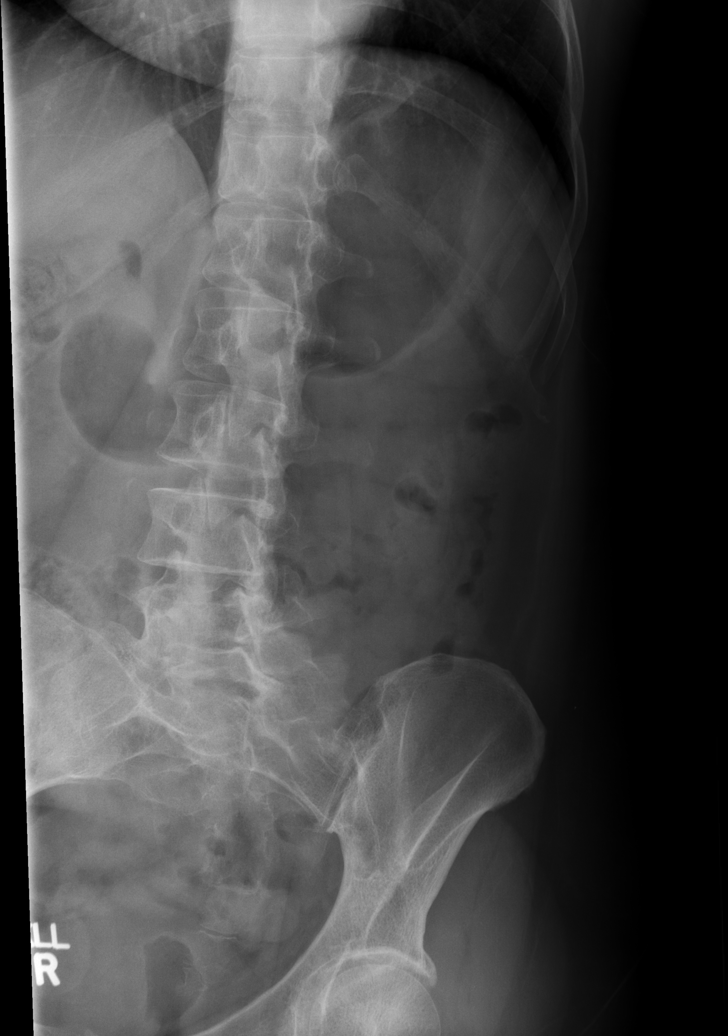

[t lumbar spine obl (2 of 2)]
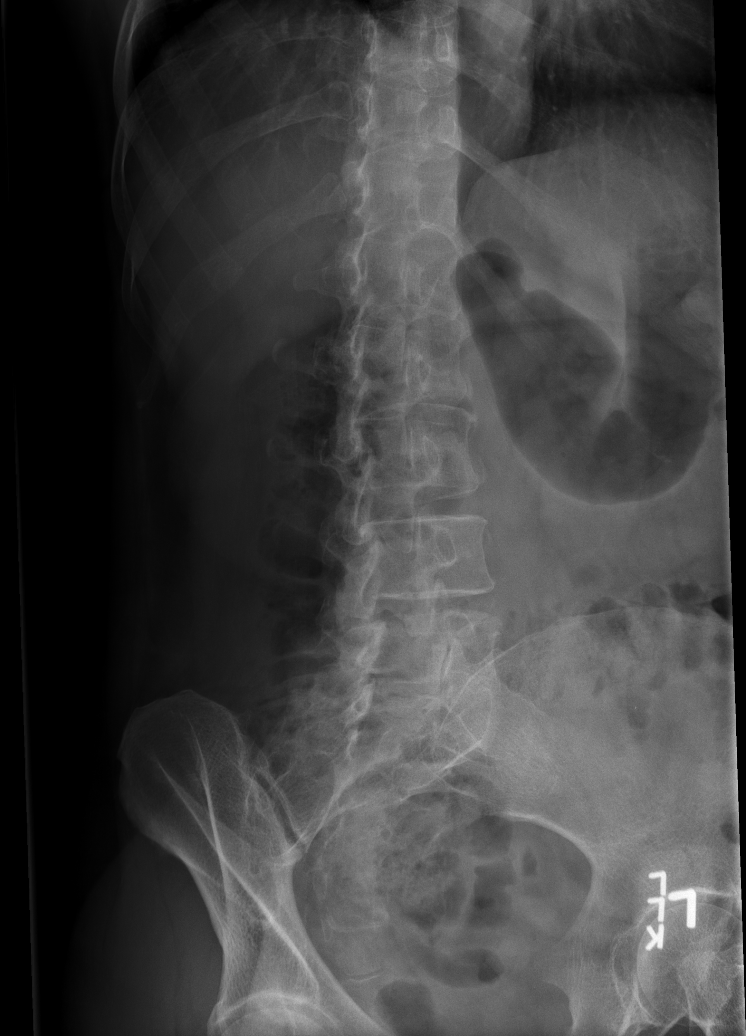

[t lumbar spine lat]
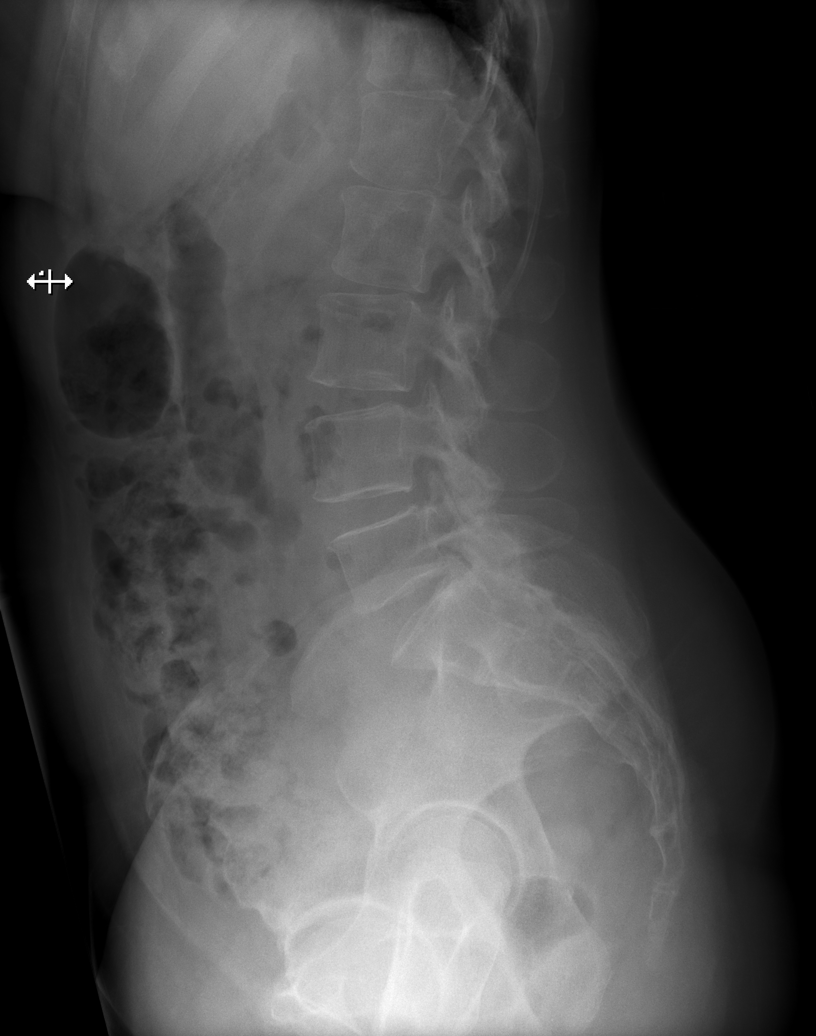

[t lumbar l-5 s-1 spot]
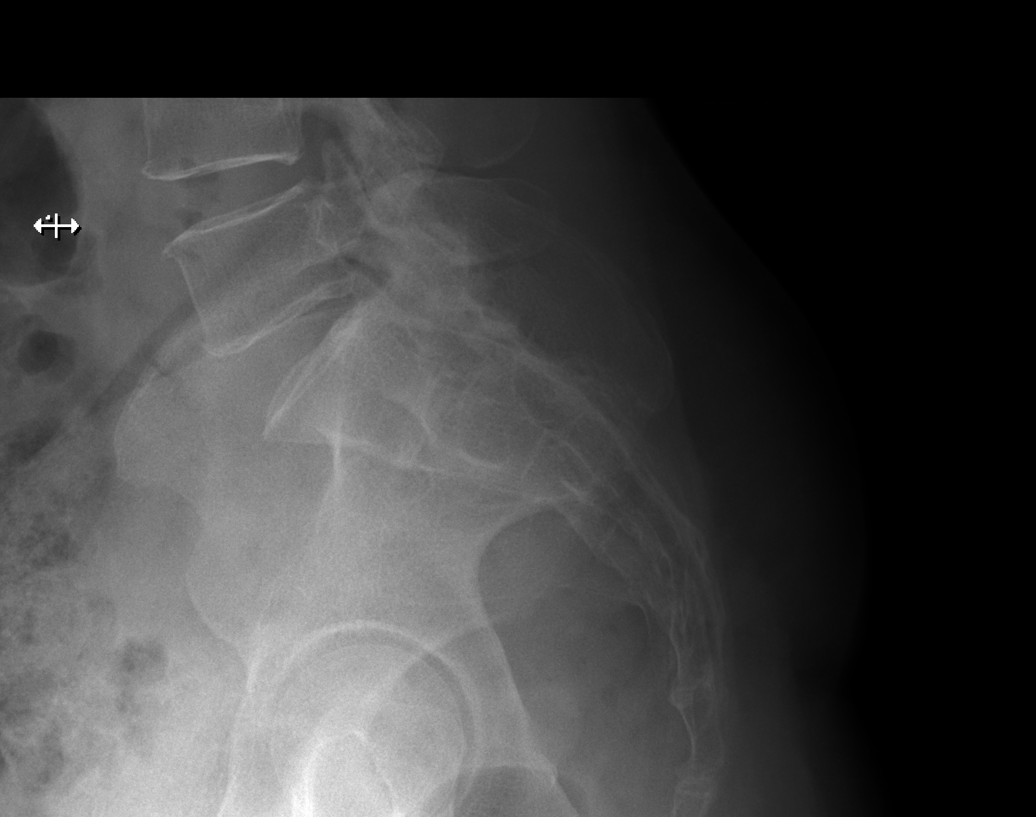

[5 of 5 positions shown; findings below may reference images not displayed]

FINDINGS: No evidence of acute fracture, spondylolysis, or
spondylolisthesis.]

Intervertebral disc spaces are maintained.  Mild lower lumbar facet
DJD is seen bilaterally at L4-5 and L5-S1.  Generalized skin is
noted.  No other bone lesions identified.
IMPRESSION: 1.  No acute findings.
2.  Mild lower lumbar facet DJD.
3.  Osteopenia.

## 2012-07-26 IMAGING — CR DG FOREARM 2V*L*
2 series · 2 of 2 positions shown · non-contrast
Comparison: No priors.

CLINICAL DATA: History of fall complaining of pain in the left
forearm.

LEFT FOREARM - 2 VIEW

[x forearm ap left]
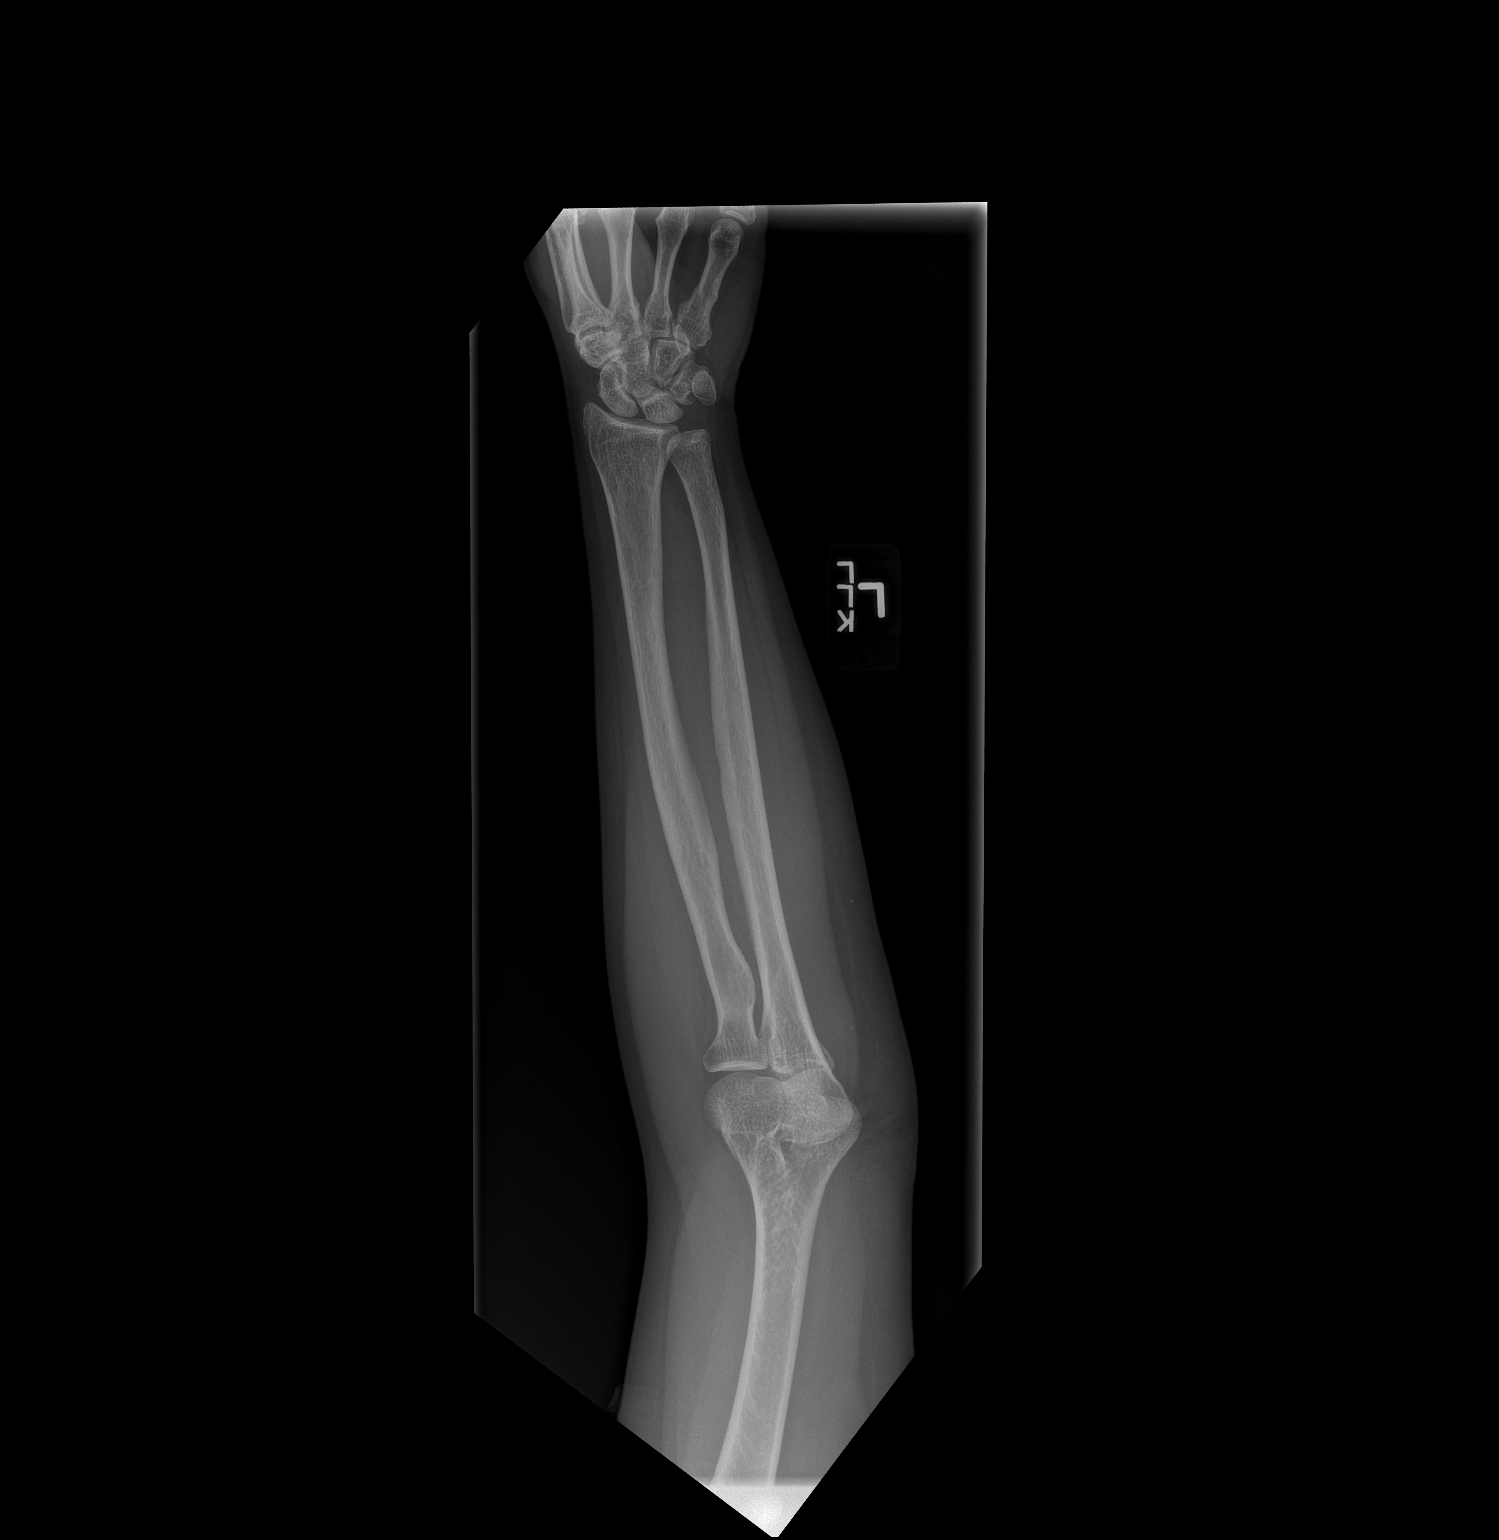

[x forearm lat left]
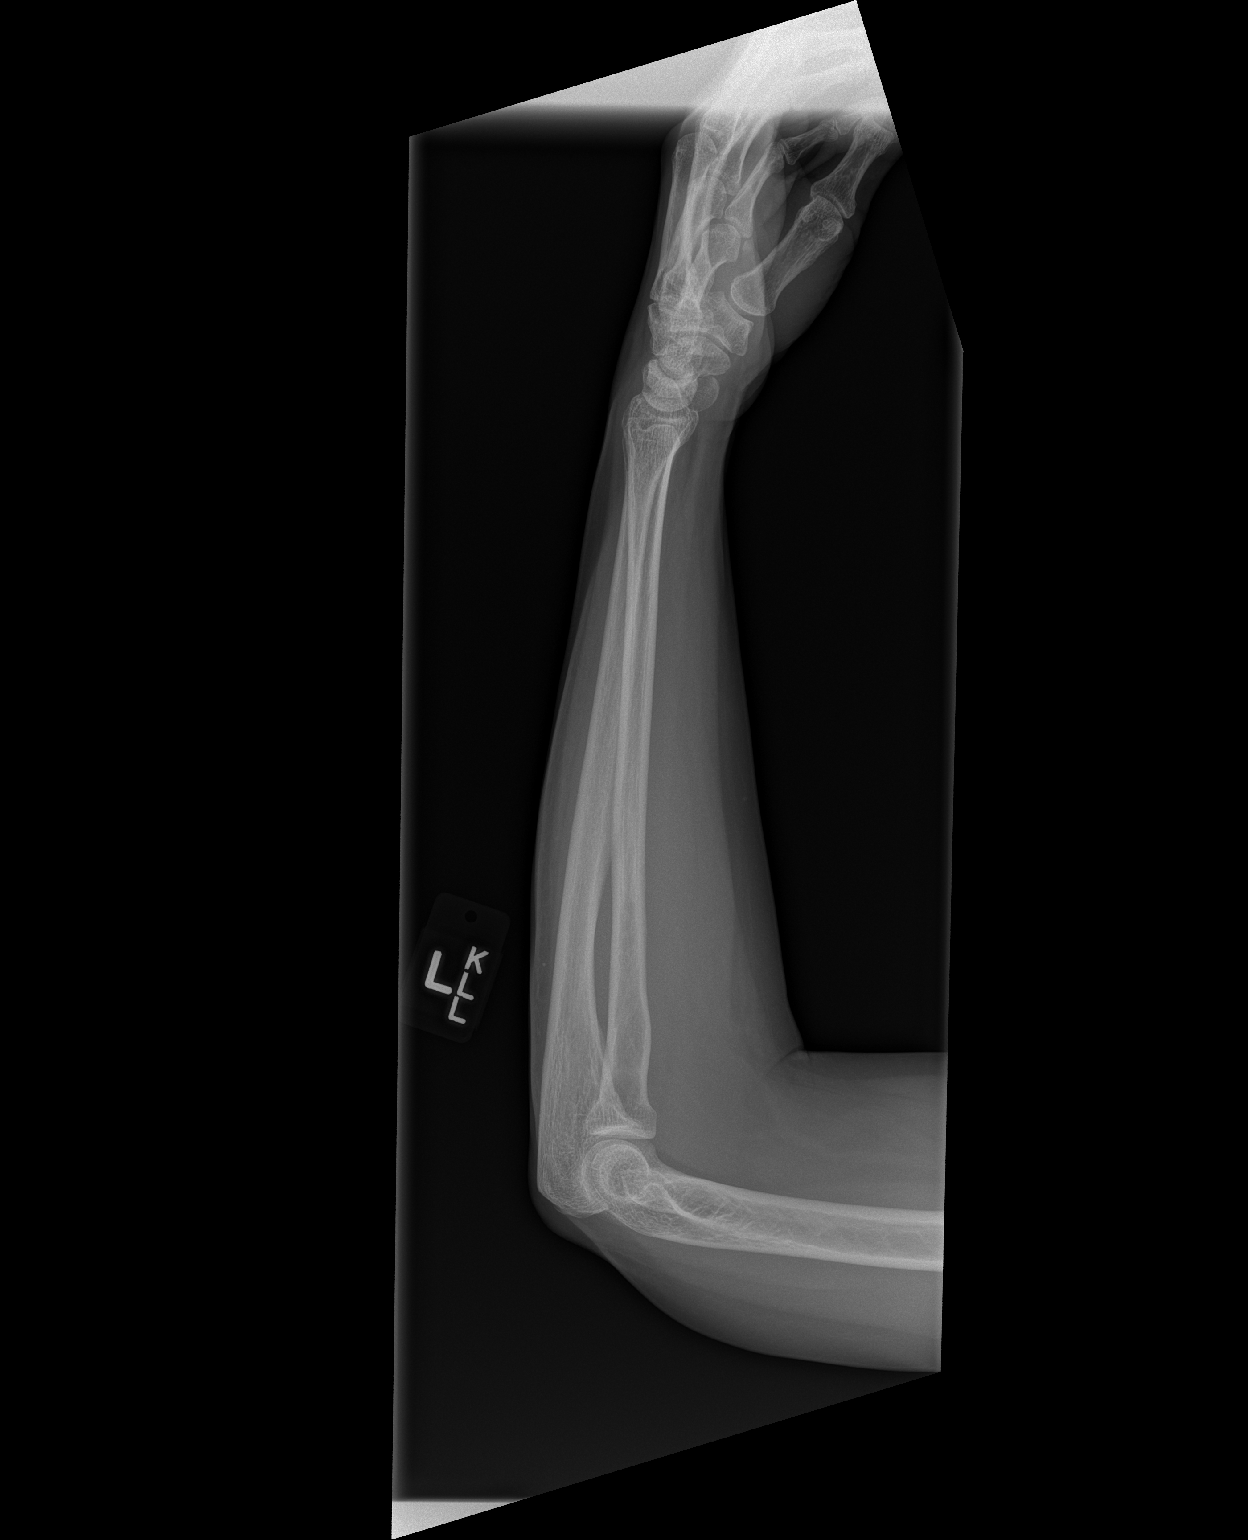

[2 of 2 positions shown; findings below may reference images not displayed]

FINDINGS: Two views of the left forearm demonstrate no acute
displaced fracture.  The soft tissues are unremarkable.
IMPRESSION: 1.  No acute displaced fracture of either the left radius or ulna.

## 2012-07-26 IMAGING — CT CT CERVICAL SPINE W/O CM
1 of 4 series · 8 of 14 positions shown, 10 images · non-contrast
Comparison: None

CT HEAD

CLINICAL DATA: Fall.  Headache.  Neck pain.

CT HEAD WITHOUT CONTRAST
CT CERVICAL SPINE WITHOUT CONTRAST
TECHNIQUE: Multidetector CT imaging of the head and cervical spine
was performed following the standard protocol without intravenous
contrast.  Multiplanar CT image reconstructions of the cervical
spine were also generated.

[Series 9: axial · axial · 0.23mm/px · z∈[+884,+1024]mm · 8 of 469 slices shown, 10 images]
[im 53/469  soft-tissue]
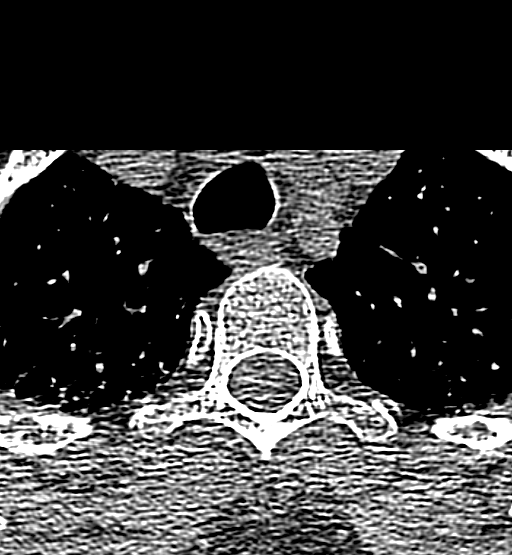
[im 53/469  bone]
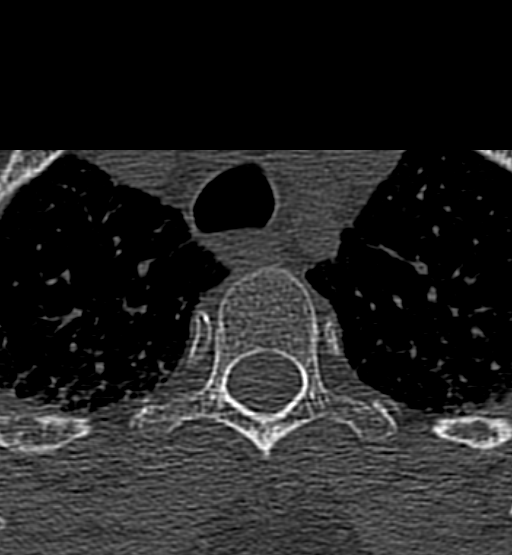
[im 105/469  bone]
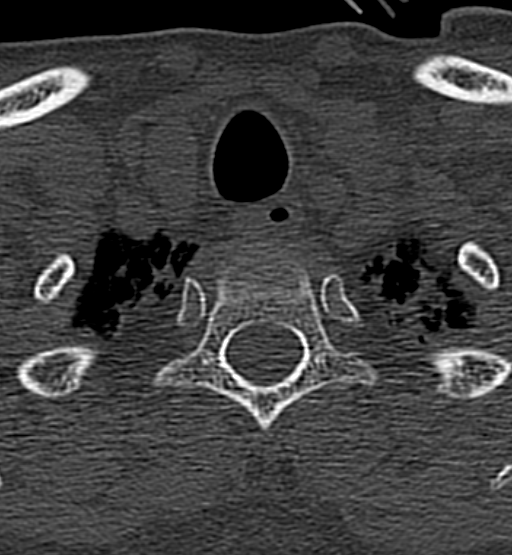
[im 157/469  bone]
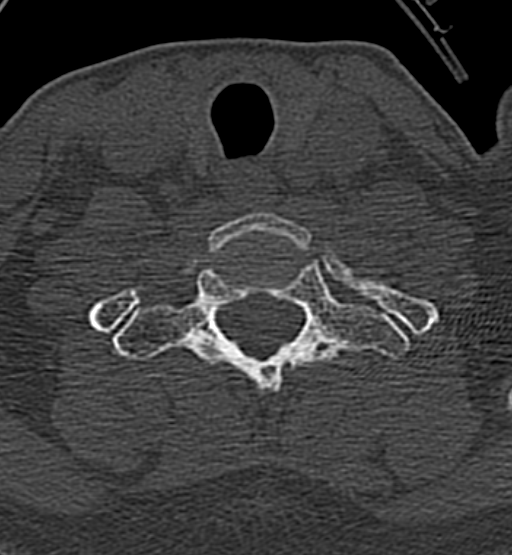
[im 209/469  bone]
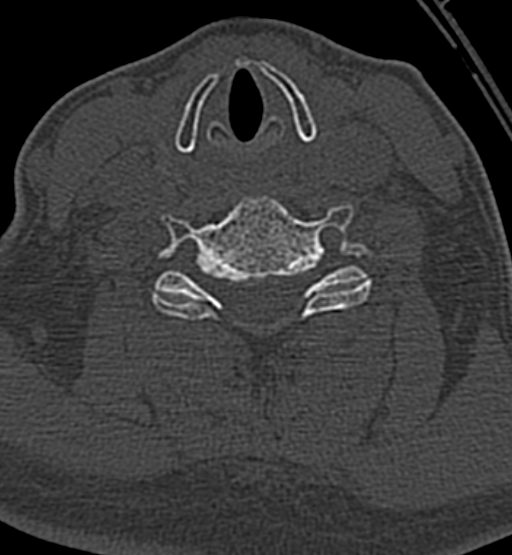
[im 261/469  soft-tissue]
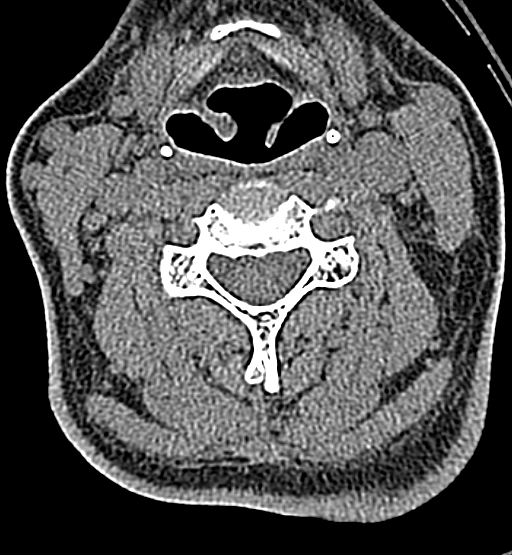
[im 261/469  bone]
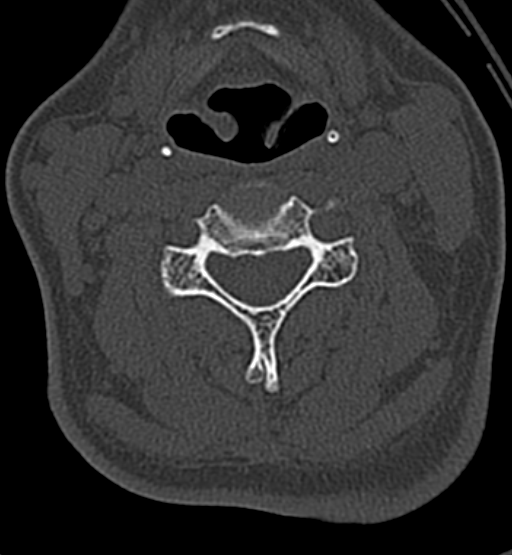
[im 313/469  bone]
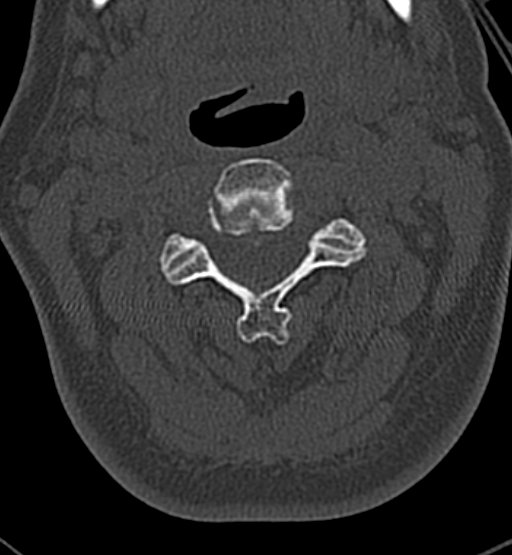
[im 365/469  bone]
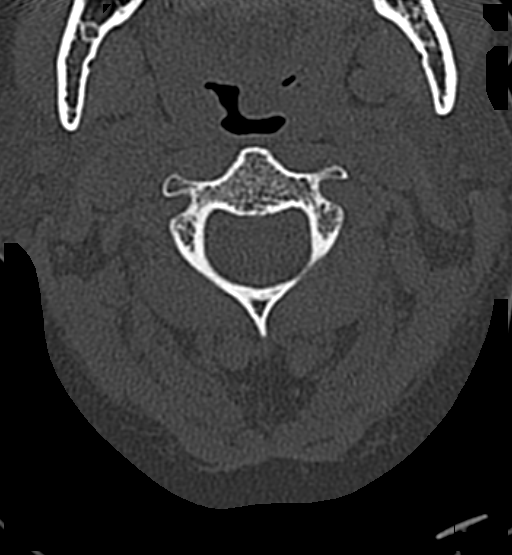
[im 417/469  bone]
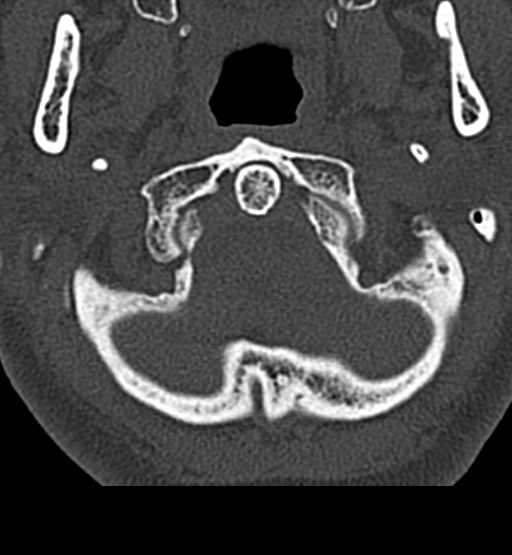

[8 of 14 positions shown; findings below may reference images not displayed]

FINDINGS: There is no evidence of intracranial hemorrhage, brain
edema or other signs of acute infarction.  There is no evidence of
intracranial mass lesion or mass effect.  No abnormal extra-axial
fluid collections are identified.

Ventricles are normal in size.  No skull fracture identified.
IMPRESSION: Negative noncontrast head CT.

CT CERVICAL SPINE
FINDINGS: No evidence of acute cervical spine fracture or
subluxation.

Mild degenerative disc disease is seen at levels of C5-6 and C6-7.
Mild facet DJD is seen at C7-T1 bilaterally. The mild atlantoaxial
degenerative changes also noted.
IMPRESSION: 1.  No evidence of cervical spine fracture or subluxation.
2.  Mild cervical spondylosis, as described above.

## 2012-07-26 IMAGING — CR DG HIP (WITH OR WITHOUT PELVIS) 2-3V*L*
3 series · 3 of 3 positions shown · non-contrast
Comparison: None.

CLINICAL DATA: Fall.  Left hip pain.

LEFT HIP - COMPLETE 2+ VIEW

[t pelvis ap]
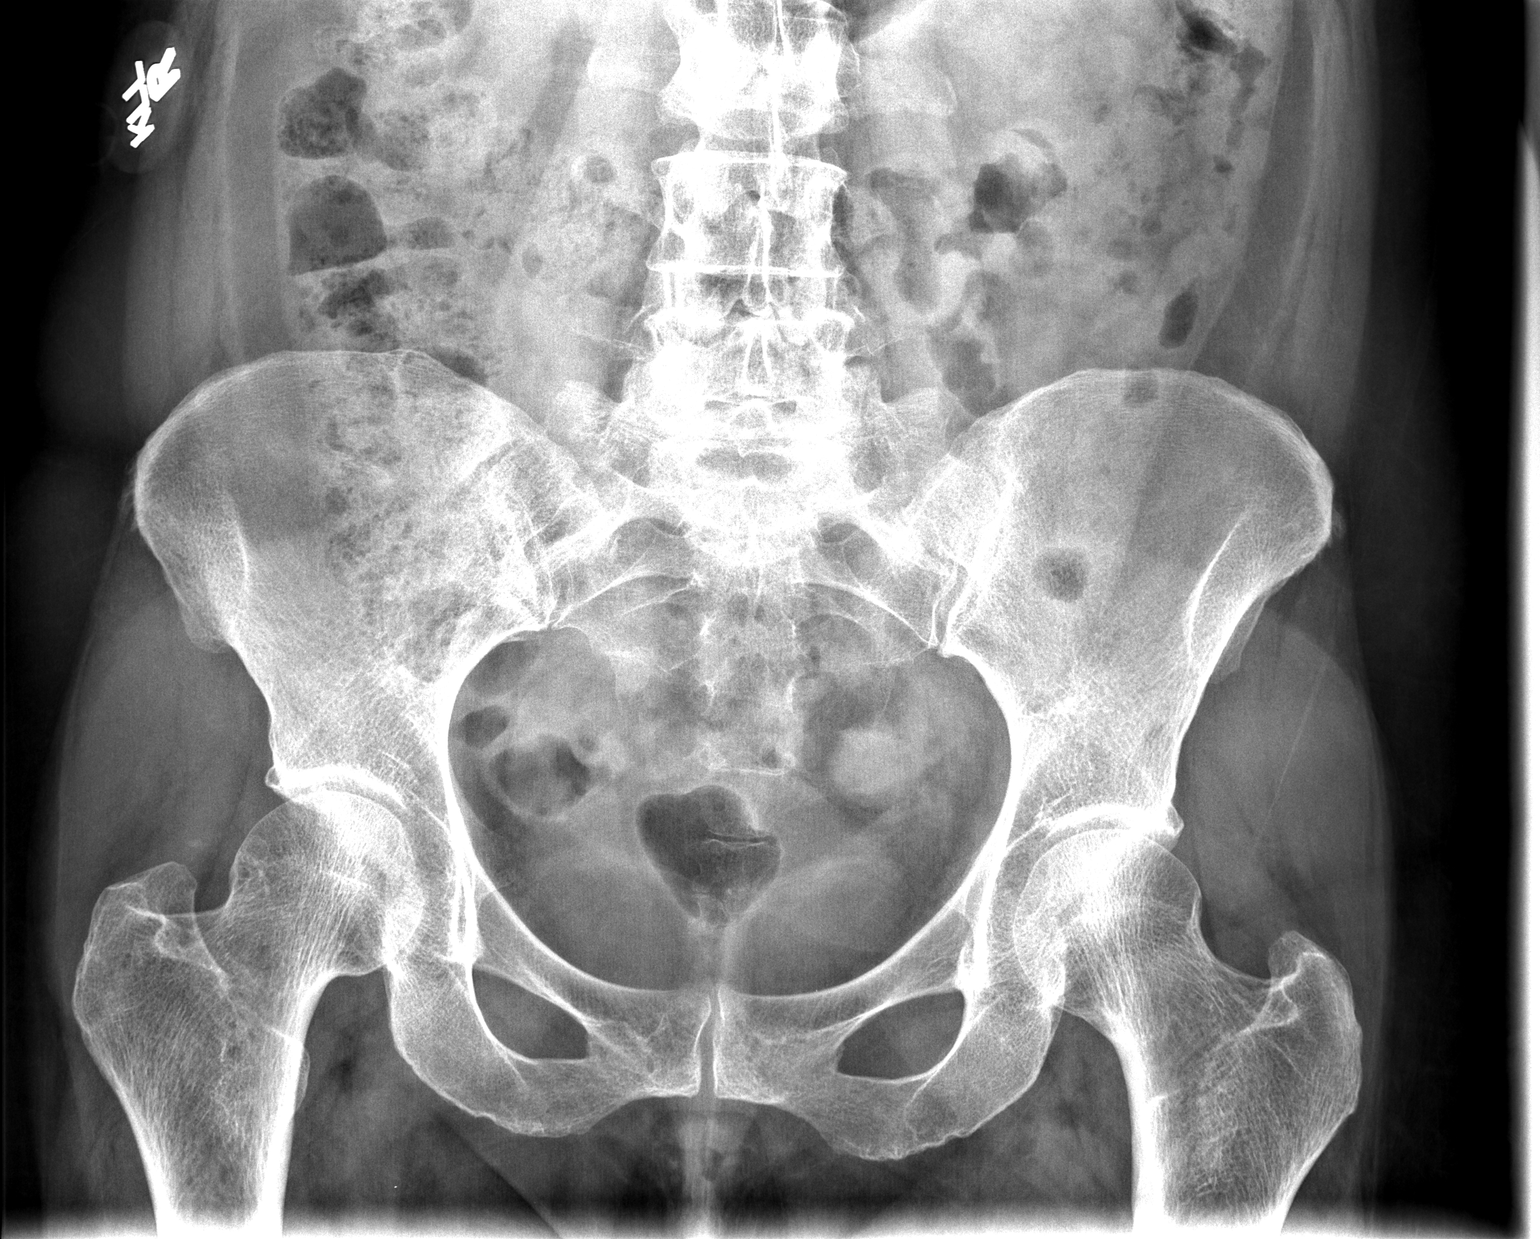

[t hip ap left]
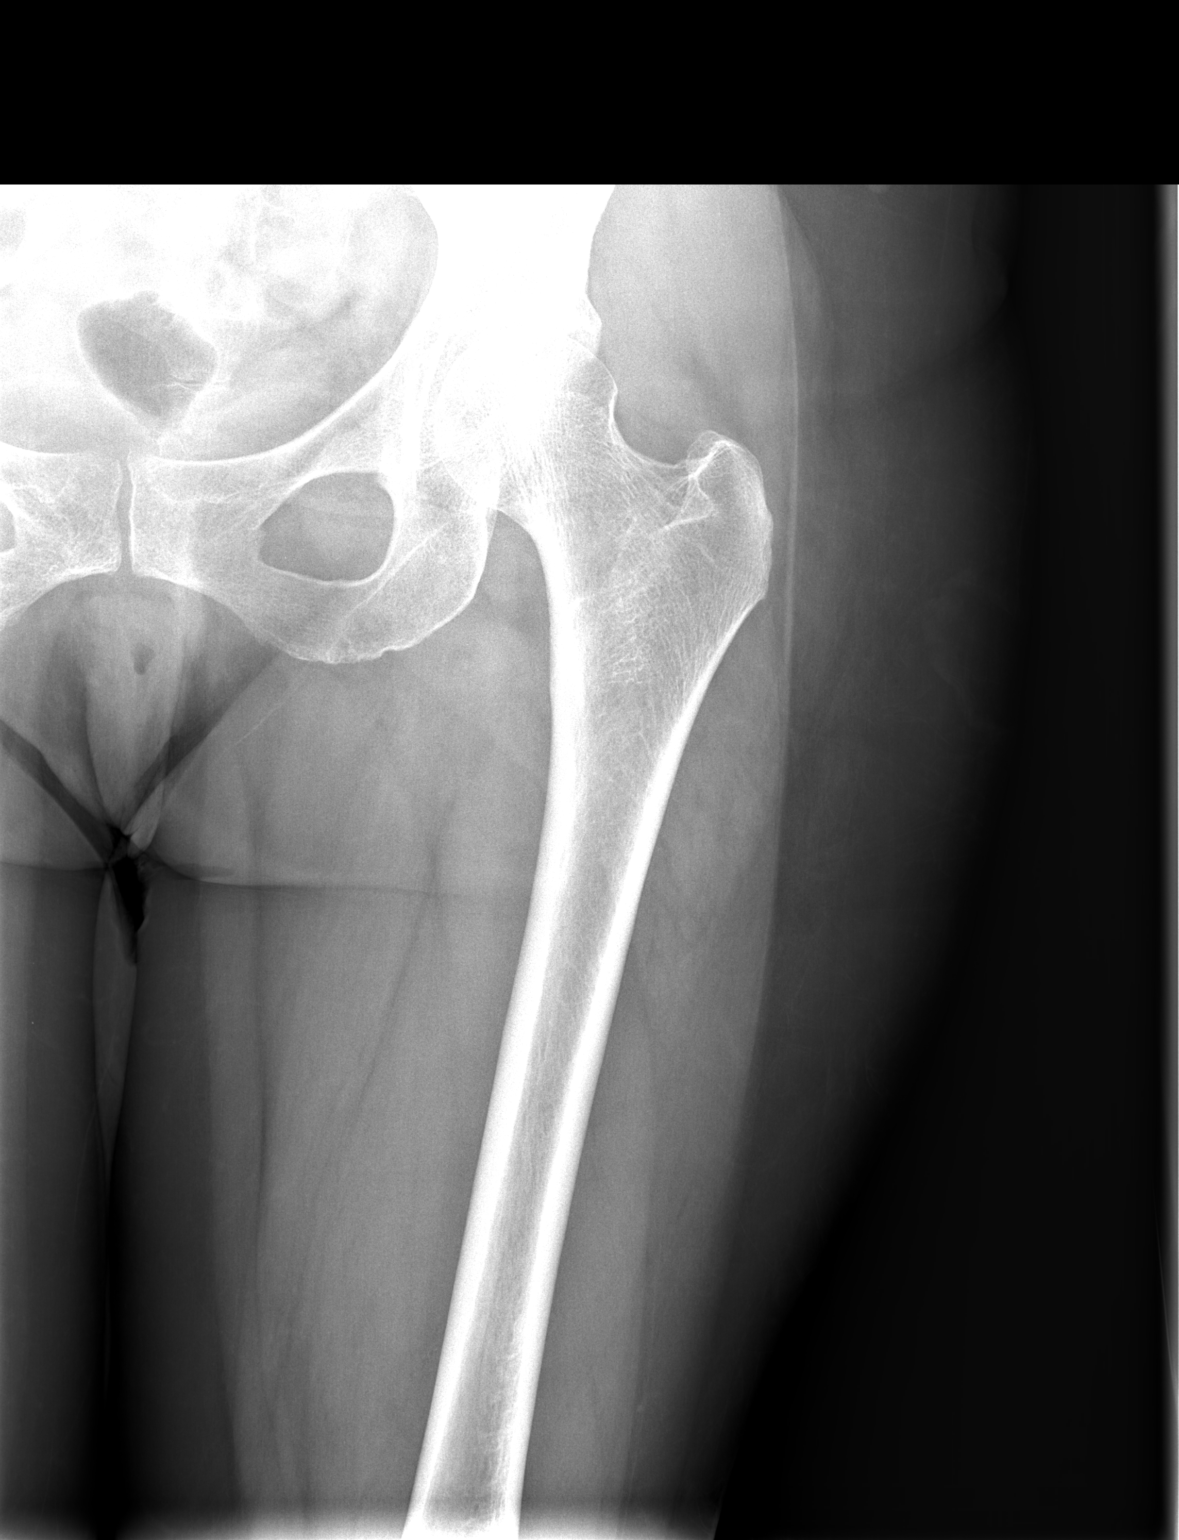

[t hip frog leg left]
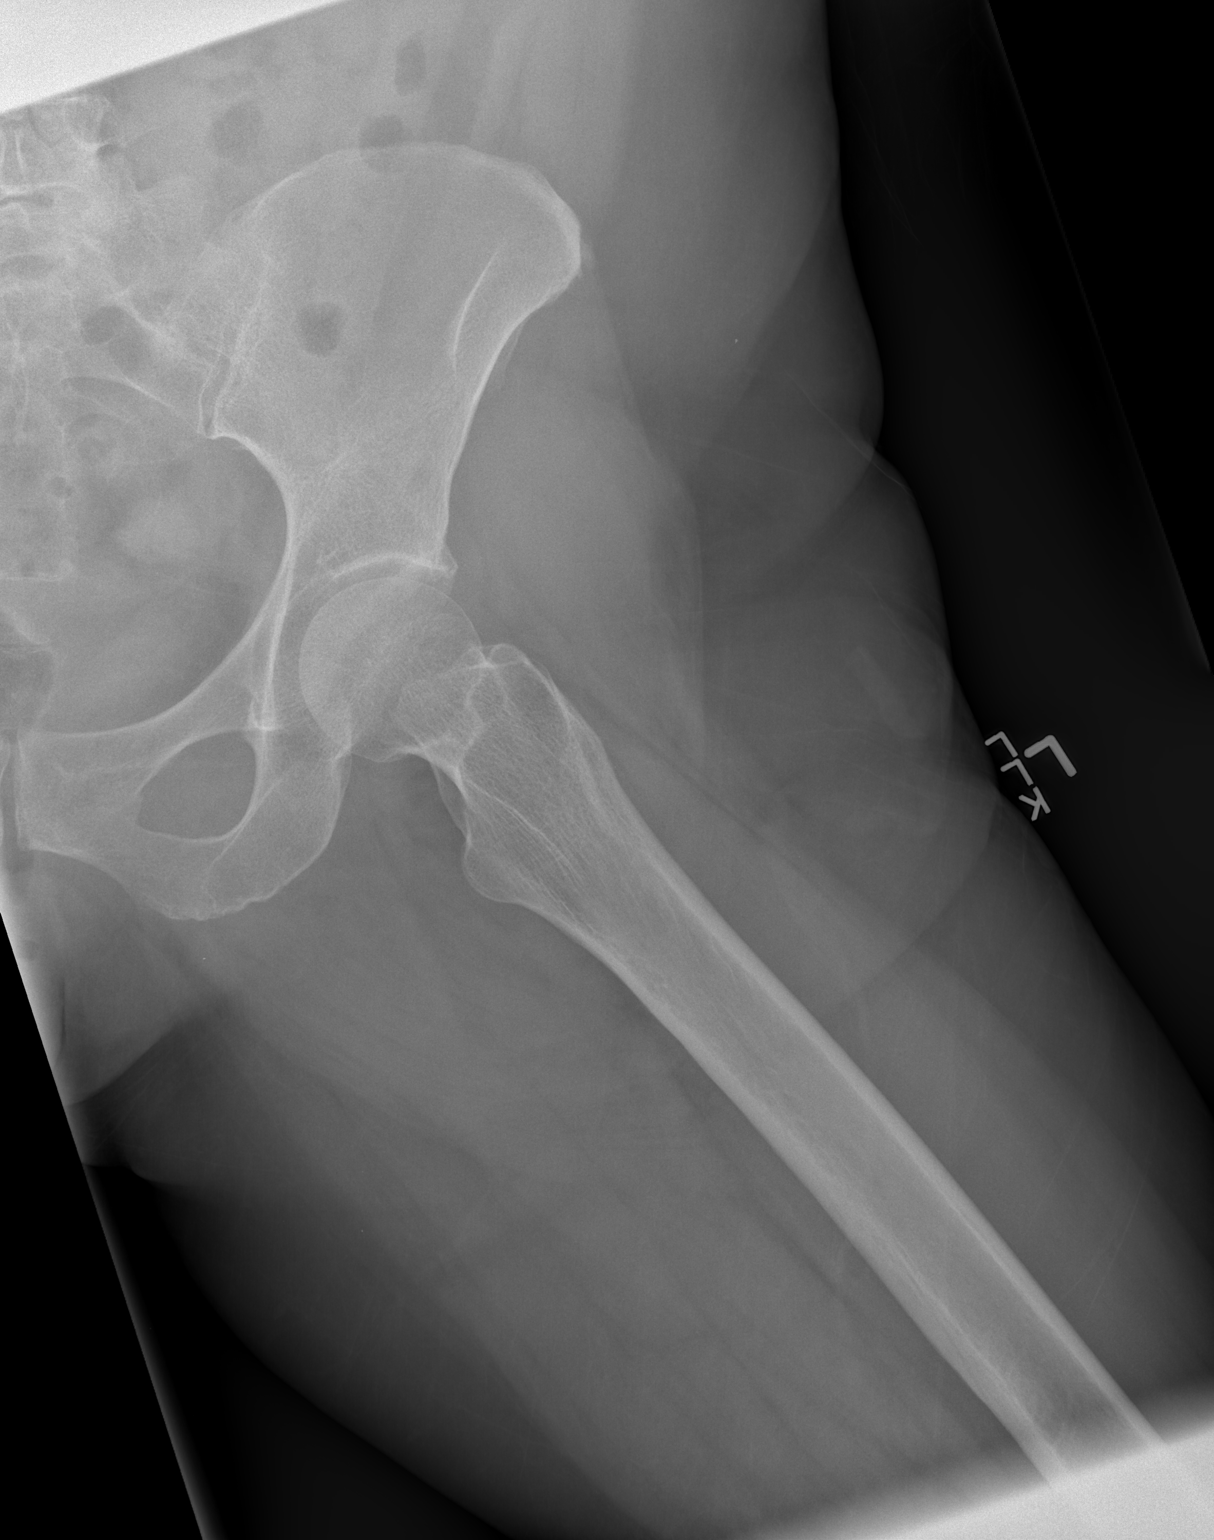

[3 of 3 positions shown; findings below may reference images not displayed]

FINDINGS: Left hip is located.  No acute bone or soft tissue
abnormality is present.
IMPRESSION: Negative left hip.

## 2012-07-26 MED ORDER — HYDROCODONE-ACETAMINOPHEN 5-325 MG PO TABS: 1.0000 | ORAL_TABLET | Freq: Four times a day (QID) | ORAL | Status: DC | PRN

## 2012-07-26 MED ORDER — METAXALONE 800 MG PO TABS: 800.0000 mg | ORAL_TABLET | Freq: Three times a day (TID) | ORAL | Status: DC | PRN

## 2012-07-26 MED ORDER — NAPROXEN 500 MG PO TABS: 500.0000 mg | ORAL_TABLET | Freq: Two times a day (BID) | ORAL | Status: DC | PRN

## 2012-07-26 MED ORDER — MORPHINE SULFATE 4 MG/ML IJ SOLN
4.0000 mg | Freq: Once | INTRAMUSCULAR | Status: AC
  Administered 2012-07-26: 4 mg via INTRAVENOUS
  Filled 2012-07-26: qty 1

## 2012-07-26 MED ORDER — ONDANSETRON HCL 4 MG/2ML IJ SOLN
4.0000 mg | Freq: Once | INTRAMUSCULAR | Status: AC
  Administered 2012-07-26: 4 mg via INTRAVENOUS
  Filled 2012-07-26: qty 2

## 2012-07-26 NOTE — ED Notes (Addendum)
PER EMS- pt picked from [facility] store with c/o fall.  Per EMS pt slipped on bathroom floor due to water.  Pt c/o head pain, back pain, and bilateral leg pain.  No LOC or deformities noted on arrival.  Pt is able to move all extremities.  Presents to the ED alert and oriented on LSD and c-collar.

## 2012-07-26 NOTE — ED Notes (Signed)
WJX:[id number]<WG> Expected date:07/26/12<BR> Expected time:12:22 PM<BR> Means of arrival:Ambulance<BR> Comments:<BR> Fall/LSB

## 2012-07-26 NOTE — ED Provider Notes (Signed)
History     CSN: [id number]  Arrival date & time 07/26/12  1229   First MD Initiated Contact with Patient 07/26/12 1232      Chief Complaint  Patient presents with  . Fall     Patient is a 56 y.o. female presenting with fall. The history is provided by the patient.  Fall The accident occurred less than 1 hour ago. Incident: pt was walking at the store and slipped on the floor. She landed on a hard floor. There was no blood loss. Pain location: head, back, left elbow, neck  and back. The pain is severe. She was not ambulatory at the scene. There was no entrapment after the fall. Pertinent negatives include no fever. Loss of consciousness: unknown. She has tried nothing for the symptoms.  Transported by EMS.  Past Medical History  Diagnosis Date  . Hypertension   . Coronary artery disease     History reviewed. No pertinent past surgical history.  No family history on file.  History  Substance Use Topics  . Smoking status: Never Smoker   . Smokeless tobacco: Not on file  . Alcohol Use: No    OB History    Grav Para Term Preterm Abortions TAB SAB Ect Mult Living                  Review of Systems  Constitutional: Negative for fever.  Neurological: Loss of consciousness: unknown.  All other systems reviewed and are negative.    Allergies  Review of patient's allergies indicates no known allergies.  Home Medications  No current outpatient prescriptions on file.  BP 147/106  Pulse 81  Temp 98.7 F (37.1 C) (Oral)  Resp 22  SpO2 99%  Physical Exam  Nursing note and vitals reviewed. Constitutional: She appears well-developed and well-nourished. No distress.  HENT:  Head: Normocephalic and atraumatic.  Right Ear: External ear normal.  Left Ear: External ear normal.  Eyes: Conjunctivae normal are normal. Right eye exhibits no discharge. Left eye exhibits no discharge. No scleral icterus.  Neck: Neck supple. No tracheal deviation present.  Cardiovascular:  Normal rate, regular rhythm and intact distal pulses.   Pulmonary/Chest: Effort normal and breath sounds normal. No stridor. No respiratory distress. She has no wheezes. She has no rales.  Abdominal: Soft. Bowel sounds are normal. She exhibits no distension. There is no tenderness. There is no rebound and no guarding.  Musculoskeletal: She exhibits no edema and no tenderness.       Left elbow: She exhibits no swelling, no effusion and no deformity.       Left hip: She exhibits decreased range of motion and tenderness. She exhibits normal strength, no swelling, no crepitus and no deformity.       Cervical back: She exhibits tenderness. She exhibits no swelling and no edema.       Thoracic back: She exhibits tenderness. She exhibits no swelling and no edema.       Lumbar back: She exhibits tenderness. She exhibits no swelling and no edema.       Right forearm: She exhibits no tenderness, no swelling, no edema and no deformity.       Left forearm: She exhibits tenderness. She exhibits no bony tenderness, no swelling and no edema.  Neurological: She is alert. She has normal strength. No sensory deficit. Cranial nerve deficit:  no gross defecits noted. She exhibits normal muscle tone. She displays no seizure activity. Coordination normal.  Skin: Skin is warm and  dry. No rash noted.  Psychiatric: She has a normal mood and affect.    ED Course  Procedures (including critical care time)  Labs Reviewed - No data to display Dg Thoracic Spine 2 View  07/26/2012  *RADIOLOGY REPORT*  Clinical Data: Fall.  Thoracic back pain.  THORACIC SPINE - 2 VIEW  Comparison:  None.  Findings:  There is no evidence of thoracic spine fracture. Alignment is normal.  No other significant bone abnormalities are identified.  IMPRESSION: Negative.   Original Report Authenticated By: Danae Orleans, M.D.    Dg Lumbar Spine Complete  07/26/2012  *RADIOLOGY REPORT*  Clinical Data: Fall.  Low back pain.  LUMBAR SPINE - COMPLETE  4+ VIEW  Comparison: None.  Findings: No evidence of acute fracture, spondylolysis, or spondylolisthesis.  Intervertebral disc spaces are maintained.  Mild lower lumbar facet DJD is seen bilaterally at L4-5 and L5-S1.  Generalized skin is noted.  No other bone lesions identified.  IMPRESSION:  1.  No acute findings. 2.  Mild lower lumbar facet DJD. 3.  Osteopenia.   Original Report Authenticated By: Danae Orleans, M.D.    Dg Elbow Complete Left  07/26/2012  *RADIOLOGY REPORT*  Clinical Data: History of fall complaining of left elbow pain.  LEFT ELBOW - COMPLETE 3+ VIEW  Comparison: No priors.  Findings: Four views of the left elbow demonstrate no acute fracture, subluxation, dislocation, joint or soft tissue abnormality.  IMPRESSION: 1.  No acute radiographic abnormality of the left elbow.   Original Report Authenticated By: Florencia Reasons, M.D.    Dg Forearm Left  07/26/2012  *RADIOLOGY REPORT*  Clinical Data: History of fall complaining of pain in the left forearm.  LEFT FOREARM - 2 VIEW  Comparison: No priors.  Findings: Two views of the left forearm demonstrate no acute displaced fracture.  The soft tissues are unremarkable.  IMPRESSION: 1.  No acute displaced fracture of either the left radius or ulna.   Original Report Authenticated By: Florencia Reasons, M.D.    Dg Hip Complete Left  07/26/2012  *RADIOLOGY REPORT*  Clinical Data: Fall.  Left hip pain.  LEFT HIP - COMPLETE 2+ VIEW  Comparison: None.  Findings: Left hip is located.  No acute bone or soft tissue abnormality is present.  IMPRESSION: Negative left hip.   Original Report Authenticated By: Jamesetta Orleans. MATTERN, M.D.    Ct Head Wo Contrast  07/26/2012  *RADIOLOGY REPORT*  Clinical Data:  Fall.  Headache.  Neck pain.  CT HEAD WITHOUT CONTRAST CT CERVICAL SPINE WITHOUT CONTRAST  Technique:  Multidetector CT imaging of the head and cervical spine was performed following the standard protocol without intravenous contrast.   Multiplanar CT image reconstructions of the cervical spine were also generated.  Comparison:   None  CT HEAD  Findings: There is no evidence of intracranial hemorrhage, brain edema or other signs of acute infarction.  There is no evidence of intracranial mass lesion or mass effect.  No abnormal extra-axial fluid collections are identified.  Ventricles are normal in size.  No skull fracture identified.  IMPRESSION: Negative noncontrast head CT.  CT CERVICAL SPINE  Findings: No evidence of acute cervical spine fracture or subluxation.  Mild degenerative disc disease is seen at levels of C5-6 and C6-7. Mild facet DJD is seen at C7-T1 bilaterally. The mild atlantoaxial degenerative changes also noted.  IMPRESSION:  1.  No evidence of cervical spine fracture or subluxation. 2.  Mild cervical spondylosis, as described above.  Original Report Authenticated By: Danae Orleans, M.D.    Ct Cervical Spine Wo Contrast  07/26/2012  *RADIOLOGY REPORT*  Clinical Data:  Fall.  Headache.  Neck pain.  CT HEAD WITHOUT CONTRAST CT CERVICAL SPINE WITHOUT CONTRAST  Technique:  Multidetector CT imaging of the head and cervical spine was performed following the standard protocol without intravenous contrast.  Multiplanar CT image reconstructions of the cervical spine were also generated.  Comparison:   None  CT HEAD  Findings: There is no evidence of intracranial hemorrhage, brain edema or other signs of acute infarction.  There is no evidence of intracranial mass lesion or mass effect.  No abnormal extra-axial fluid collections are identified.  Ventricles are normal in size.  No skull fracture identified.  IMPRESSION: Negative noncontrast head CT.  CT CERVICAL SPINE  Findings: No evidence of acute cervical spine fracture or subluxation.  Mild degenerative disc disease is seen at levels of C5-6 and C6-7. Mild facet DJD is seen at C7-T1 bilaterally. The mild atlantoaxial degenerative changes also noted.  IMPRESSION:  1.  No evidence of  cervical spine fracture or subluxation. 2.  Mild cervical spondylosis, as described above.   Original Report Authenticated By: Danae Orleans, M.D.      1. Fall   2. Contusion   3. Sprain       MDM  Patient does not have any evidence of fracture, dislocation or other more serious injury associated with her fall. Suspect she has soft tissue injury strain. She'll be discharged home on medications for pain. Recommend follow up with her primary care doctor or orthopedic if her injuries have not resolved over the next week        Celene Kras, MD 07/26/12 1517

## 2012-11-29 ENCOUNTER — Other Ambulatory Visit: Payer: Self-pay

## 2013-04-21 ENCOUNTER — Other Ambulatory Visit: Payer: Self-pay | Admitting: Internal Medicine

## 2013-04-21 DIAGNOSIS — Z1231 Encounter for screening mammogram for malignant neoplasm of breast: Secondary | ICD-10-CM

## 2013-05-08 ENCOUNTER — Ambulatory Visit
Admission: RE | Admit: 2013-05-08 | Discharge: 2013-05-08 | Disposition: A | Discharge status: Home / Self Care | Payer: BC Managed Care – PPO | Source: Ambulatory Visit | Attending: Internal Medicine | Admitting: Internal Medicine

## 2013-05-08 DIAGNOSIS — Z1231 Encounter for screening mammogram for malignant neoplasm of breast: Secondary | ICD-10-CM

## 2013-05-12 ENCOUNTER — Other Ambulatory Visit: Payer: Self-pay | Admitting: Internal Medicine

## 2013-05-12 DIAGNOSIS — R928 Other abnormal and inconclusive findings on diagnostic imaging of breast: Secondary | ICD-10-CM

## 2013-05-27 ENCOUNTER — Other Ambulatory Visit: Payer: BC Managed Care – PPO

## 2013-05-27 ENCOUNTER — Ambulatory Visit
Admission: RE | Admit: 2013-05-27 | Discharge: 2013-05-27 | Disposition: A | Discharge status: Home / Self Care | Payer: BC Managed Care – PPO | Source: Ambulatory Visit | Attending: Internal Medicine | Admitting: Internal Medicine

## 2013-05-27 ENCOUNTER — Other Ambulatory Visit: Payer: Self-pay | Admitting: Internal Medicine

## 2013-05-27 DIAGNOSIS — R928 Other abnormal and inconclusive findings on diagnostic imaging of breast: Secondary | ICD-10-CM

## 2013-06-09 ENCOUNTER — Other Ambulatory Visit: Payer: Self-pay | Admitting: Internal Medicine

## 2013-06-09 ENCOUNTER — Ambulatory Visit
Admission: RE | Admit: 2013-06-09 | Discharge: 2013-06-09 | Disposition: A | Discharge status: Home / Self Care | Payer: BC Managed Care – PPO | Source: Ambulatory Visit | Attending: Internal Medicine | Admitting: Internal Medicine

## 2013-06-09 DIAGNOSIS — R928 Other abnormal and inconclusive findings on diagnostic imaging of breast: Secondary | ICD-10-CM

## 2013-08-20 ENCOUNTER — Other Ambulatory Visit: Payer: Self-pay

## 2013-12-09 ENCOUNTER — Other Ambulatory Visit: Payer: Self-pay | Admitting: Internal Medicine

## 2013-12-09 ENCOUNTER — Ambulatory Visit
Admission: RE | Admit: 2013-12-09 | Discharge: 2013-12-09 | Disposition: A | Discharge status: Home / Self Care | Payer: 59 | Source: Ambulatory Visit | Attending: Internal Medicine | Admitting: Internal Medicine

## 2013-12-09 DIAGNOSIS — R053 Chronic cough: Secondary | ICD-10-CM

## 2013-12-09 DIAGNOSIS — R05 Cough: Secondary | ICD-10-CM

## 2013-12-09 IMAGING — CR DG CHEST 2V
2 series · 2 of 2 positions shown · non-contrast
Comparison: [DATE]

CLINICAL DATA: Cough, shortness of Breath

EXAM:
CHEST  2 VIEW

[w chest pa]
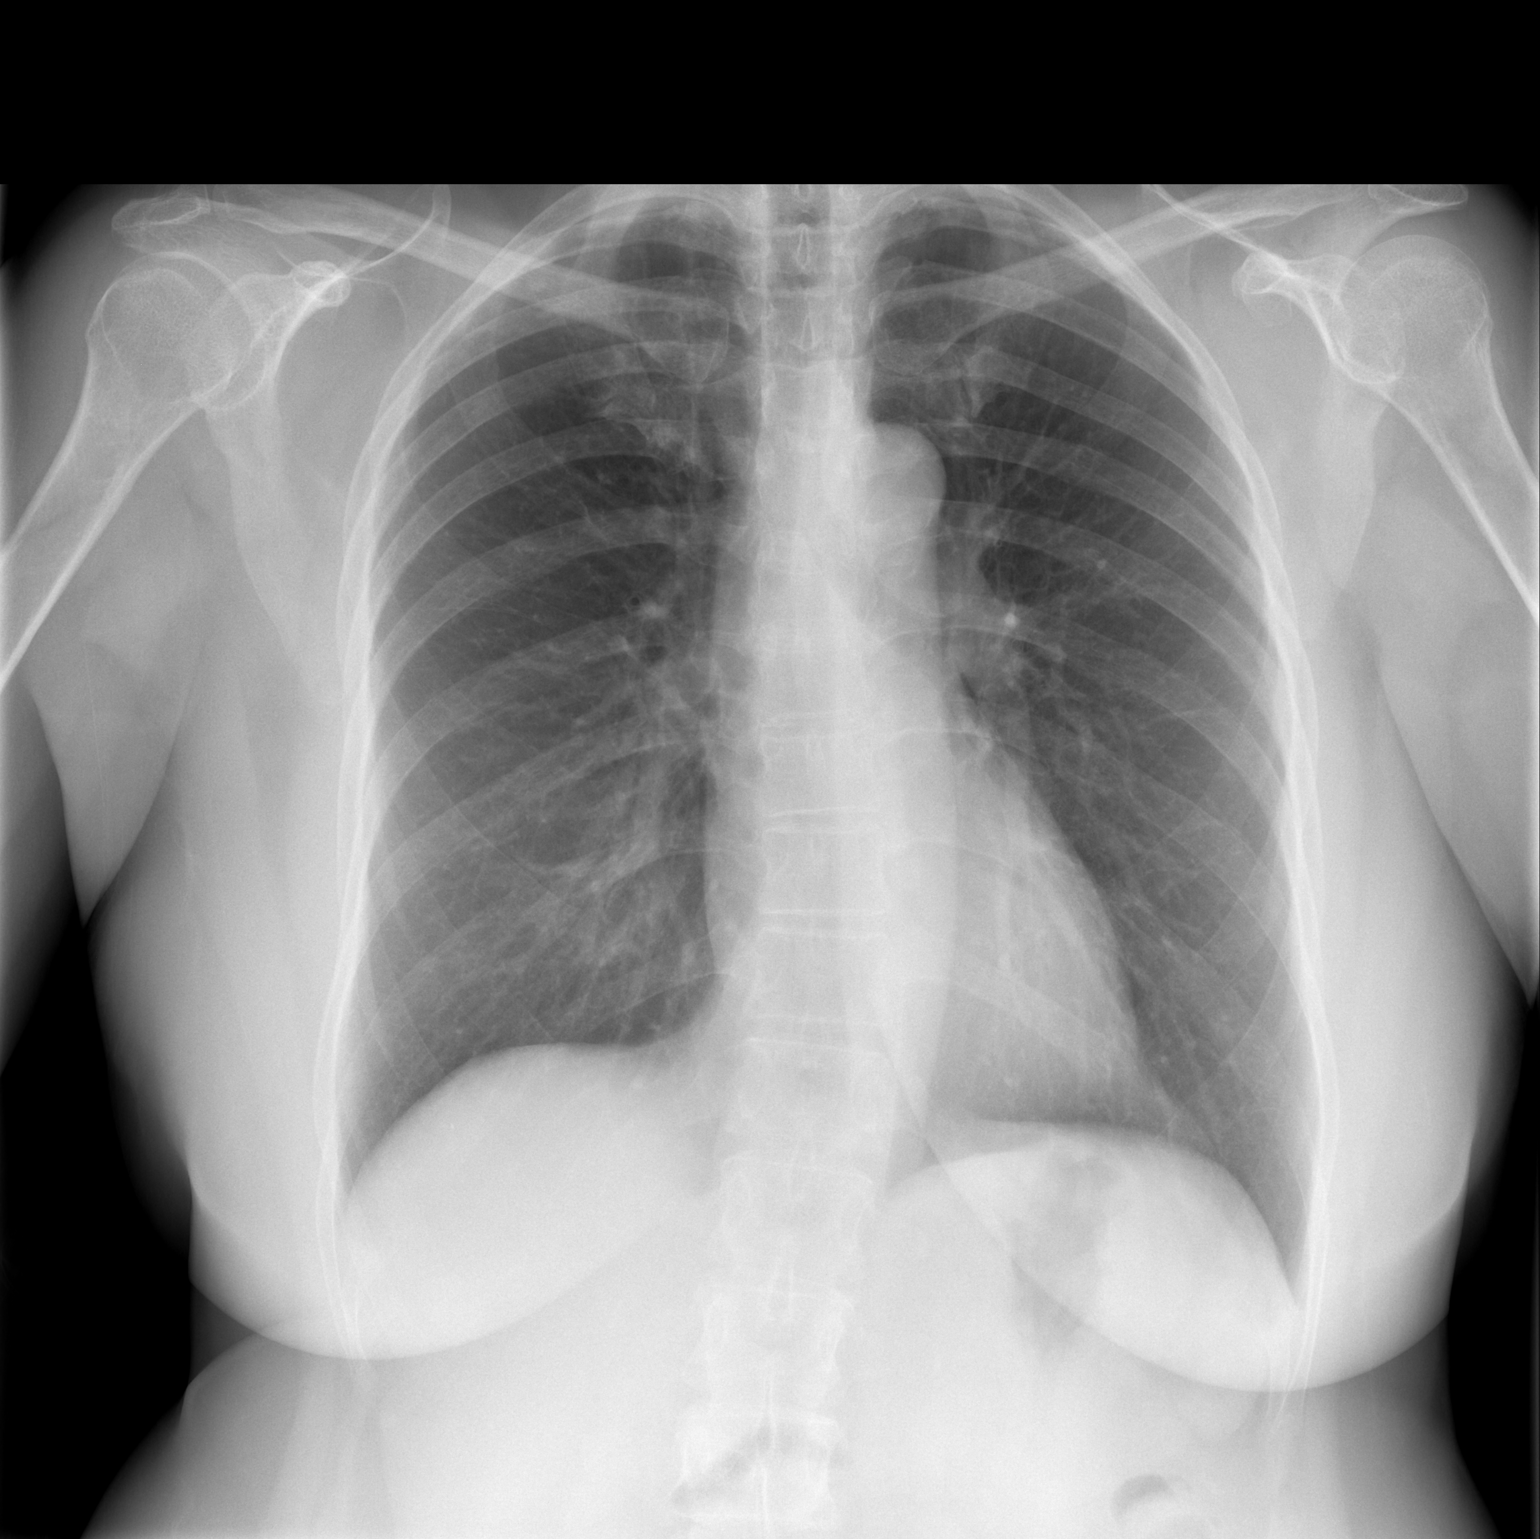

[w chest lat]
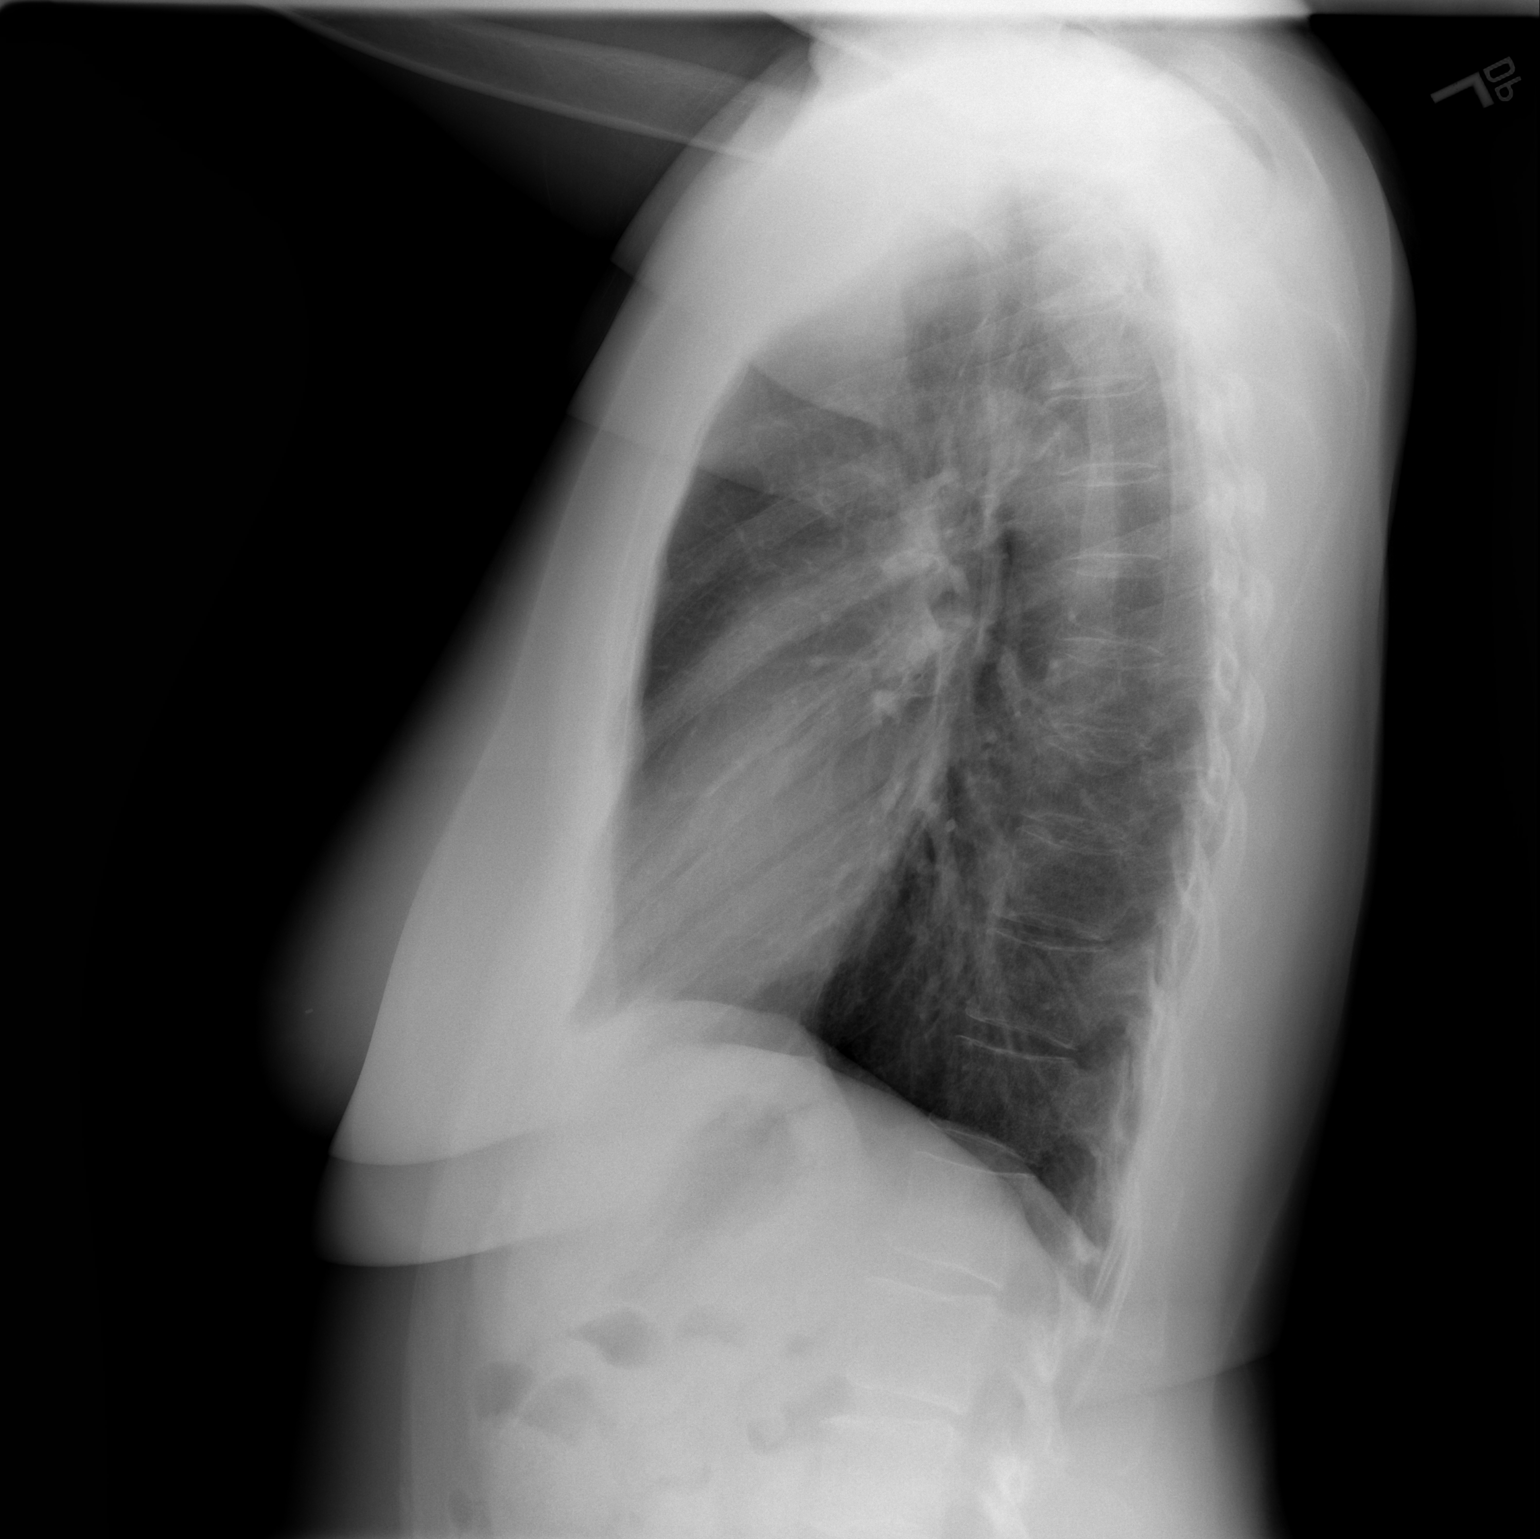

[2 of 2 positions shown; findings below may reference images not displayed]

FINDINGS: Cardiomediastinal silhouette is unremarkable. Mild lower thoracic
levoscoliosis. No acute infiltrate or pleural effusion. No pulmonary
edema.
IMPRESSION: No active cardiopulmonary disease.

## 2014-07-30 ENCOUNTER — Other Ambulatory Visit: Payer: Self-pay

## 2015-04-13 ENCOUNTER — Encounter: Payer: Self-pay | Admitting: Internal Medicine

## 2015-05-16 ENCOUNTER — Encounter: Payer: Self-pay | Admitting: Gastroenterology

## 2015-06-07 ENCOUNTER — Encounter: Payer: Self-pay | Admitting: Internal Medicine

## 2015-07-05 ENCOUNTER — Encounter: Payer: Self-pay | Admitting: Gastroenterology

## 2015-08-15 ENCOUNTER — Encounter: Payer: Self-pay | Admitting: Neurology

## 2015-08-15 ENCOUNTER — Ambulatory Visit (INDEPENDENT_AMBULATORY_CARE_PROVIDER_SITE_OTHER): Payer: 59 | Admitting: Neurology

## 2015-08-15 ENCOUNTER — Encounter: Payer: Self-pay | Admitting: *Deleted

## 2015-08-15 VITALS — BP 150/94 | HR 82 | Ht 62.0 in | Wt 161.2 lb

## 2015-08-15 DIAGNOSIS — G44229 Chronic tension-type headache, not intractable: Secondary | ICD-10-CM | POA: Diagnosis not present

## 2015-08-15 MED ORDER — AMITRIPTYLINE HCL 25 MG PO TABS: 25.0000 mg | ORAL_TABLET | Freq: Every day | ORAL | Status: DC

## 2015-08-15 NOTE — Patient Instructions (Addendum)
Overall you are doing fairly well but I do want to suggest a few things today:   Remember to drink plenty of fluid, eat healthy meals and do not skip any meals. Try to eat protein with a every meal and eat a healthy snack such as fruit or nuts in between meals. Try to keep a regular sleep-wake schedule and try to exercise daily, particularly in the form of walking, 20-30 minutes a day, if you can.   As far as your medications are concerned, I would like to suggest; Amitriptyline 25mg  an hour before bedtime. We can increase as needed.  Our phone number is [phone]. We also have an after hours call service for urgent matters and there is a physician on-call for urgent questions. For any emergencies you know to call 911 or go to the nearest emergency room  To prevent or relieve headaches, try the following: Cool Compress. Lie down and place a cool compress on your head.  Avoid headache triggers. If certain foods or odors seem to have triggered your migraines in the past, avoid them. A headache diary might help you identify triggers.  Include physical activity in your daily routine. Try a daily walk or other moderate aerobic exercise.  Manage stress. Find healthy ways to cope with the stressors, such as delegating tasks on your to-do list.  Practice relaxation techniques. Try deep breathing, yoga, massage and visualization.  Eat regularly. Eating regularly scheduled meals and maintaining a healthy diet might help prevent headaches. Also, drink plenty of fluids.  Follow a regular sleep schedule. Sleep deprivation might contribute to headaches Consider biofeedback. With this mind-body technique, you learn to control certain bodily functions - such as muscle tension, heart rate and blood pressure - to prevent headaches or reduce headache pain.    Proceed to emergency room if you experience new or worsening symptoms or symptoms do not resolve, if you have new neurologic symptoms or if headache is  severe, or for any concerning symptom.

## 2015-08-15 NOTE — Progress Notes (Signed)
[facility]    Provider:  Dr Lucia GaskinsAhern Referring Provider: Jackie Plumsei-Bonsu, George, MD Primary Care Physician:  Jackie PlumSEI-BONSU,GEORGE, MD  CC:  Dizziness and Headache  HPI:  Amy EdelsonLucina Cline is a 59 y.o. female here as a referral from Dr. Julio Sickssei-Bonsu for dizziness and headache. Past medical history of hypertension, anxiety, fibromyalgia. She is non-english speaking and is here with an interpreter. Symptoms started 2 months ago. No inciting factors, no trauma, no new medication. She has been having a lot of stress, she has depression after losing her son at the age of 61 in an accident. She has been thinking of that recently. She is teary in the office. She has had headaches for a long time. She has been having headaches for 14 years. She has a history of depression. Then the headaches started. The headaches are all over th ehead, pressure, throbbing, on both sides of the head. No light sensitivity or sound sensitivity. She has sensitivity in the scalp, she feels a burning sensation on the top of her head. No nausea or vomiting. The headaches are 4 days a week. She takes alleve for the headaches, not every day. No vision changes, no speech changes, no hearing changes. The pain can be severe, worsening. She gets blurry vision and dizziness with the headaches. Symptoms have been gong on for 12 years but they are worsening recently. Amitriptyline in the past 25mg  helped her a lot. No cardiac problems. She has insomnia.  Ct of the head 07/2012 showed No acute intracranial abnormalities including mass lesion or mass effect, hydrocephalus, extra-axial fluid collection, midline shift, hemorrhage, or acute infarction, large ischemic events (personally reviewed images)   Review of Systems: Patient complains of symptoms per HPI as well as the following symptoms: Fatigue, palpitations, spinning sensation, blurred vision, constipation, urination problems, joint pain, memory loss, confusion, headache,  dizziness, change in appetite, insomnia . Pertinent negatives per HPI. All others negative.   Social History   Social History  . Marital Status: Married    Spouse Name: Amy Cline  . Number of Children: 2  . Years of Education: 16   Occupational History  . Retired    Social History Main Topics  . Smoking status: Never Smoker   . Smokeless tobacco: Not on file  . Alcohol Use: No  . Drug Use: No  . Sexual Activity: Not on file   Other Topics Concern  . Not on file   Social History Narrative   Lives at home with husband, Amy Cline.   Caffeine use:     Family History  Problem Relation Age of Onset  . Migraines Neg Hx     Past Medical History  Diagnosis Date  . Hypertension   . Coronary artery disease   . GERD (gastroesophageal reflux disease)   . Chronic headaches   . Fibromyalgia   . Insomnia   . Vitamin D deficiency   . Hyperlipemia   . Prediabetes     Past Surgical History  Procedure Laterality Date  . Abdominal hysterectomy  2008  . Appendectomy      Current Outpatient Prescriptions  Medication Sig Dispense Refill  . omeprazole (PRILOSEC) 20 MG capsule Take 20 mg by mouth daily.    Marland Kitchen. amitriptyline (ELAVIL) 25 MG tablet Take 1 tablet (25 mg total) by mouth at bedtime. 30 tablet 11  . HYDROcodone-acetaminophen (NORCO/VICODIN) 5-325 MG per tablet Take 1 tablet by mouth every 6 (six) hours as needed for pain (Take 1 - 2 tablets every 4 - 6  hours.). (Patient not taking: Reported on 08/15/2015) 20 tablet 0  . lisinopril (PRINIVIL,ZESTRIL) 20 MG tablet Take 20 mg by mouth daily.    . metaxalone (SKELAXIN) 800 MG tablet Take 1 tablet (800 mg total) by mouth 3 (three) times daily as needed for pain (Take 1 table every 8 hours as needed for pain.). (Patient not taking: Reported on 08/15/2015) 20 tablet 0  . naproxen (NAPROSYN) 500 MG tablet Take 1 tablet (500 mg total) by mouth 2 (two) times daily as needed. (Patient not taking: Reported on 08/15/2015) 30 tablet 0   No  current facility-administered medications for this visit.    Allergies as of 08/15/2015  . (No Known Allergies)    Vitals: BP 150/94 mmHg  Pulse 82  Ht  (1.575 m)  Wt 161 lb 3.2 oz (73.12 kg)  BMI 29.48 kg/m2 Last Weight:  Wt Readings from Last 1 Encounters:  08/15/15 161 lb 3.2 oz (73.12 kg)   Last Height:   Ht Readings from Last 1 Encounters:  08/15/15  (1.575 m)    Physical exam: Exam: Gen: NAD, conversant, well nourised,  well groomed                     CV: RRR, no MRG. No Carotid Bruits. No peripheral edema, warm, nontender Eyes: Conjunctivae clear without exudates or hemorrhage  Neuro: Detailed Neurologic Exam  Speech:    Speech is normal; fluent and spontaneous with normal comprehension.  Cognition:    The patient is oriented to person, place, and time;     recent and remote memory intact;     language fluent;     normal attention, concentration,     fund of knowledge Cranial Nerves:    The pupils are equal, round, and reactive to light. The fundi are flat. Visual fields are full to finger confrontation. Extraocular movements are intact. Trigeminal sensation is intact and the muscles of mastication are normal. The face is symmetric. The palate elevates in the midline. Hearing intact. Voice is normal. Shoulder shrug is normal. The tongue has normal motion without fasciculations.   Coordination:    Normal finger to nose and heel to shin. Normal rapid alternating movements.   Gait:    Heel-toe and tandem gait are normal.   Motor Observation:    No asymmetry, no atrophy, and no involuntary movements noted. Tone:    Normal muscle tone.    Posture:    Posture is normal. normal erect    Strength:    Strength is V/V in the upper and lower limbs.      Sensation: intact to LT     Reflex Exam:  DTR's:    Deep tendon reflexes in the upper and lower extremities are normal bilaterally.   Toes:    The toes are downgoing bilaterally.   Clonus:     Clonus is absent.   Assessment/Plan:  59 year old with chronic tension-type headache. She declined CT of the head. Start Amitriptyline for headaches Amitriptyline may help with insomnia as well Discussed side effects including teratogenicity, do not Pregnant on this medication and use birth control. Serious side effects can include hypotension, hypertension, syncope, ventricular arrhythmias, QT prolongation and other cardiac side effects, stroke and seizures, ataxia tardive dyskinesias, extrapyramidal symptoms, increased intraocular pressure, leukopenia, thrombocytopenia, hallucinations, suicidality and other serious side effects. Common reactions include drowsiness, dry mouth, dizziness, constipation, blurred vision, palpitations, tachycardia, impaired coordination, increased appetite, nausea vomiting, weakness, confusion, disorientation, restlessness, anxiety  and other side effects.  To prevent or relieve headaches, try the following: Cool Compress. Lie down and place a cool compress on your head.  Avoid headache triggers. If certain foods or odors seem to have triggered your migraines in the past, avoid them. A headache diary might help you identify triggers.  Include physical activity in your daily routine. Try a daily walk or other moderate aerobic exercise.  Manage stress. Find healthy ways to cope with the stressors, such as delegating tasks on your to-do list.  Practice relaxation techniques. Try deep breathing, yoga, massage and visualization.  Eat regularly. Eating regularly scheduled meals and maintaining a healthy diet might help prevent headaches. Also, drink plenty of fluids.  Follow a regular sleep schedule. Sleep deprivation might contribute to headaches Consider biofeedback. With this mind-body technique, you learn to control certain bodily functions - such as muscle tension, heart rate and blood pressure - to prevent headaches or reduce headache pain.    Proceed to emergency  room if you experience new or worsening symptoms or symptoms do not resolve, if you have new neurologic symptoms or if headache is severe, or for any concerning symptom.    Naomie Dean, MD  [facility] [address] [place], [place] [address]  Phone [phone] Fax [phone]

## 2015-08-16 ENCOUNTER — Encounter: Payer: Self-pay | Admitting: Neurology

## 2015-08-16 DIAGNOSIS — G44229 Chronic tension-type headache, not intractable: Secondary | ICD-10-CM | POA: Insufficient documentation

## 2015-11-15 ENCOUNTER — Ambulatory Visit: Payer: 59 | Admitting: Neurology

## 2015-11-15 ENCOUNTER — Telehealth: Payer: Self-pay | Admitting: *Deleted

## 2015-11-15 NOTE — Telephone Encounter (Signed)
no showed follow-up appt 

## 2016-03-29 DIAGNOSIS — M797 Fibromyalgia: Secondary | ICD-10-CM | POA: Insufficient documentation

## 2016-03-29 DIAGNOSIS — I10 Essential (primary) hypertension: Secondary | ICD-10-CM | POA: Insufficient documentation

## 2016-03-29 DIAGNOSIS — K219 Gastro-esophageal reflux disease without esophagitis: Secondary | ICD-10-CM | POA: Insufficient documentation

## 2016-05-23 ENCOUNTER — Encounter: Payer: Self-pay | Admitting: Internal Medicine

## 2016-05-25 ENCOUNTER — Encounter: Payer: Self-pay | Admitting: Gastroenterology

## 2016-05-28 ENCOUNTER — Telehealth: Payer: Self-pay | Admitting: Cardiovascular Disease

## 2016-05-28 NOTE — Telephone Encounter (Signed)
[facility] for appointment on 06/22/16 with Dr Allyson SabalBerry.  Records given to N Hines(medical records) for Dr Hazle CocaBerry's schedule on 06/22/16. lp

## 2016-05-29 ENCOUNTER — Ambulatory Visit: Payer: Self-pay | Admitting: Cardiovascular Disease

## 2016-06-07 ENCOUNTER — Ambulatory Visit: Payer: Self-pay | Admitting: Physician Assistant

## 2016-06-13 ENCOUNTER — Encounter: Payer: Self-pay | Admitting: Internal Medicine

## 2016-06-22 ENCOUNTER — Ambulatory Visit: Payer: Self-pay | Admitting: Cardiovascular Disease

## 2016-07-17 ENCOUNTER — Encounter: Payer: Self-pay | Admitting: Cardiovascular Disease

## 2016-07-17 ENCOUNTER — Ambulatory Visit (INDEPENDENT_AMBULATORY_CARE_PROVIDER_SITE_OTHER): Payer: BLUE CROSS/BLUE SHIELD | Admitting: Cardiovascular Disease

## 2016-07-17 VITALS — BP 127/90 | HR 73 | Ht 63.0 in | Wt 148.4 lb

## 2016-07-17 DIAGNOSIS — I10 Essential (primary) hypertension: Secondary | ICD-10-CM

## 2016-07-17 DIAGNOSIS — R0789 Other chest pain: Secondary | ICD-10-CM | POA: Diagnosis not present

## 2016-07-17 NOTE — Progress Notes (Signed)
07/17/2016 Marlis EdelsonLucina Matte   Dec 07, 1955  [id number]  Primary Physician Jackie PlumSEI-BONSU,GEORGE, MD Primary Cardiologist: Runell GessJonathan J Avery Eustice MD Roseanne RenoFACP, FACC, FAHA, FSCAI  HPI:  Ms Amy Cline is a [age] year old thin-appearing married [place] female mother of 2 living children, grandmother 4 grandchildren [facility] cardiovascular evaluation because of atypical/pleuritic chest pain. Her only risk factor is mild hypertension on low-dose amlodipine. Her pain is really pleuritic. There is intent to do a GI workup as well. Her chest pain is not exertional. It does not radiate nor are there other associated symptoms.   Current Outpatient Prescriptions  Medication Sig Dispense Refill  . amitriptyline (ELAVIL) 25 MG tablet Take 1 tablet (25 mg total) by mouth at bedtime. 30 tablet 11  . gabapentin (NEURONTIN) 300 MG capsule Take 1 capsule by mouth daily.    . metaxalone (SKELAXIN) 800 MG tablet Take 1 tablet (800 mg total) by mouth 3 (three) times daily as needed for pain (Take 1 table every 8 hours as needed for pain.). 20 tablet 0  . naproxen (NAPROSYN) 500 MG tablet Take 1 tablet by mouth daily.    Marland Kitchen. omeprazole (PRILOSEC) 20 MG capsule Take 20 mg by mouth daily.     No current facility-administered medications for this visit.     No Known Allergies  Social History   Social History  . Marital status: Married    Spouse name: [name]  . Number of children: 2  . Years of education: 16   Occupational History  . Retired    Social History Main Topics  . Smoking status: Never Smoker  . Smokeless tobacco: Not on file  . Alcohol use No  . Drug use: No  . Sexual activity: Not on file   Other Topics Concern  . Not on file   Social History Narrative   Lives at home with husband, [name].   Caffeine use:      Review of Systems: General: negative for chills, fever, night sweats or weight changes.  Cardiovascular: negative for chest pain, dyspnea on exertion, edema,  orthopnea, palpitations, paroxysmal nocturnal dyspnea or shortness of breath Dermatological: negative for rash Respiratory: negative for cough or wheezing Urologic: negative for hematuria Abdominal: negative for nausea, vomiting, diarrhea, bright red blood per rectum, melena, or hematemesis Neurologic: negative for visual changes, syncope, or dizziness All other systems reviewed and are otherwise negative except as noted above.    Blood pressure 127/90, pulse 73, height 5\' 3"  (1.6 m), weight 148 lb 6.4 oz (67.3 kg), SpO2 97 %.  General appearance: alert and no distress Neck: no adenopathy, no carotid bruit, no JVD, supple, symmetrical, trachea midline and thyroid not enlarged, symmetric, no tenderness/mass/nodules Lungs: clear to auscultation bilaterally Heart: regular rate and rhythm, S1, S2 normal, no murmur, click, rub or gallop Extremities: extremities normal, atraumatic, no cyanosis or edema  EKG normal sinus rhythm at 73 with ST or T-wave changes. I personally reviewed this EKG  ASSESSMENT AND PLAN:   Essential hypertension History of hypertension on low-dose amlodipine with blood pressure measured today at 127/90. Continue  current meds at current dosing  Atypical chest pain The patient complains of occasional pleuritic chest pain. There is no exertional component. In the absence of significant risk factors I do not think this is cardiac in nature nor do I feel compelled to order any functional studies. She is cleared for any GI workup at low cardiac risk.      Runell GessJonathan J. Brodi Kari MD FACP,FACC,FAHA, [facility]  07/17/2016 4:27 PM

## 2016-07-17 NOTE — Assessment & Plan Note (Signed)
The patient complains of occasional pleuritic chest pain. There is no exertional component. In the absence of significant risk factors I do not think this is cardiac in nature nor do I feel compelled to order any functional studies. She is cleared for any GI workup at low cardiac risk.

## 2016-07-17 NOTE — Patient Instructions (Signed)
Medication Instructions:  NO CHANGES.   Follow-Up: Your physician recommends that you schedule a follow-up appointment in: AS NEEDED BASIS.  You are cleared at low-risk for you upcoming colonoscopy, no further testing is needed.    If you need a refill on your cardiac medications before your next appointment, please call your pharmacy.

## 2016-07-17 NOTE — Assessment & Plan Note (Addendum)
History of hypertension on low-dose amlodipine with blood pressure measured today at 127/90. Continue  current meds at current dosing

## 2016-07-26 ENCOUNTER — Other Ambulatory Visit: Payer: Self-pay

## 2016-08-08 ENCOUNTER — Ambulatory Visit: Payer: Self-pay | Admitting: Gastroenterology

## 2017-04-23 ENCOUNTER — Encounter: Payer: Self-pay | Admitting: Family Medicine

## 2017-08-09 ENCOUNTER — Ambulatory Visit (INDEPENDENT_AMBULATORY_CARE_PROVIDER_SITE_OTHER): Payer: Self-pay | Admitting: Physician Assistant

## 2017-08-09 ENCOUNTER — Encounter (INDEPENDENT_AMBULATORY_CARE_PROVIDER_SITE_OTHER): Payer: Self-pay | Admitting: Physician Assistant

## 2017-08-09 VITALS — BP 151/91 | HR 76 | Temp 97.6°F | Ht 62.99 in | Wt 148.6 lb

## 2017-08-09 DIAGNOSIS — R5383 Other fatigue: Secondary | ICD-10-CM

## 2017-08-09 DIAGNOSIS — F418 Other specified anxiety disorders: Secondary | ICD-10-CM

## 2017-08-09 DIAGNOSIS — E559 Vitamin D deficiency, unspecified: Secondary | ICD-10-CM

## 2017-08-09 DIAGNOSIS — G894 Chronic pain syndrome: Secondary | ICD-10-CM

## 2017-08-09 DIAGNOSIS — H538 Other visual disturbances: Secondary | ICD-10-CM

## 2017-08-09 DIAGNOSIS — I10 Essential (primary) hypertension: Secondary | ICD-10-CM

## 2017-08-09 MED ORDER — NAPROXEN 500 MG PO TABS: 500.0000 mg | ORAL_TABLET | Freq: Two times a day (BID) | ORAL | 0 refills | Status: DC

## 2017-08-09 MED ORDER — OMEPRAZOLE 20 MG PO CPDR: 20.0000 mg | DELAYED_RELEASE_CAPSULE | Freq: Every day | ORAL | 2 refills | Status: DC

## 2017-08-09 MED ORDER — ESCITALOPRAM OXALATE 20 MG PO TABS: 20.0000 mg | ORAL_TABLET | Freq: Every day | ORAL | 2 refills | Status: DC

## 2017-08-09 MED ORDER — HYDROCHLOROTHIAZIDE 12.5 MG PO TABS: 12.5000 mg | ORAL_TABLET | Freq: Every day | ORAL | 5 refills | Status: DC

## 2017-08-09 MED ORDER — GABAPENTIN 300 MG PO CAPS: 300.0000 mg | ORAL_CAPSULE | Freq: Three times a day (TID) | ORAL | 2 refills | Status: DC | PRN

## 2017-08-09 MED ORDER — LOSARTAN POTASSIUM 50 MG PO TABS: 50.0000 mg | ORAL_TABLET | Freq: Every day | ORAL | 5 refills | Status: DC

## 2017-08-09 MED ORDER — CLONAZEPAM 0.5 MG PO TABS: 0.5000 mg | ORAL_TABLET | Freq: Every day | ORAL | 0 refills | Status: DC

## 2017-08-09 MED ORDER — VITAMIN B-12 500 MCG PO TABS: 500.0000 ug | ORAL_TABLET | Freq: Every day | ORAL | 1 refills | Status: DC

## 2017-08-09 MED ORDER — VITAMIN D-3 125 MCG (5000 UT) PO TABS: 1.0000 | ORAL_TABLET | Freq: Every day | ORAL | 1 refills | Status: DC

## 2017-08-09 NOTE — Progress Notes (Signed)
Subjective:  Patient ID: Amy Cline, female    DOB: 1956-05-29  Age: 61 y.o. MRN: [id number]  CC: body pain  HPI Amy Cline is a 61 y.o. female with a medical history of bladder prolapse, herniation of rectum into vagina, breast mass, chronic headaches, CAD, fibromyalgia, GERD, HLD, HTN, insomnia, prediabetes, plantar fascitis, and vitamin D deficiency presents as a new patient with complaint of right greater that left hemispheric body pain. Cites severe pain in the right upper trapezius. Believes her symptoms started 16 years ago after the death of her son. Had previously seen a psychologist but has not ever taken SSRI or SNRI.       Outpatient Medications Prior to Visit  Medication Sig Dispense Refill  . amitriptyline (ELAVIL) 25 MG tablet Take 1 tablet (25 mg total) by mouth at bedtime. 30 tablet 11  . gabapentin (NEURONTIN) 300 MG capsule Take 1 capsule by mouth daily.    . metaxalone (SKELAXIN) 800 MG tablet Take 1 tablet (800 mg total) by mouth 3 (three) times daily as needed for pain (Take 1 table every 8 hours as needed for pain.). 20 tablet 0  . omeprazole (PRILOSEC) 20 MG capsule Take 20 mg by mouth daily.     No facility-administered medications prior to visit.      ROS Review of Systems  Constitutional: Positive for malaise/fatigue. Negative for chills and fever.  Eyes: Positive for blurred vision.  Respiratory: Negative for shortness of breath.   Cardiovascular: Negative for chest pain and palpitations.  Gastrointestinal: Negative for abdominal pain and nausea.  Genitourinary: Negative for dysuria and hematuria.  Musculoskeletal: Positive for back pain, joint pain, myalgias and neck pain.  Skin: Negative for rash.  Neurological: Negative for tingling and headaches.  Psychiatric/Behavioral: Positive for depression. The patient is nervous/anxious.     Objective:     Physical Exam  Constitutional: She is oriented to person, place, and time.  Well  developed, well nourished, NAD, polite  HENT:  Head: Normocephalic and atraumatic.  Eyes: No scleral icterus.  Neck: Normal range of motion. Neck supple. No thyromegaly present.  Cardiovascular: Normal rate, regular rhythm and normal heart sounds.   Pulmonary/Chest: Effort normal and breath sounds normal. No respiratory distress. She has no wheezes.  Abdominal: Soft. Bowel sounds are normal. There is no tenderness.  Musculoskeletal: She exhibits no edema.  TTP of the upper chest, upper back, elbows, hips, and knees.  Neurological: She is alert and oriented to person, place, and time. No cranial nerve deficit. Coordination normal.  Skin: Skin is warm and dry. No rash noted. No erythema. No pallor.  Psychiatric: She has a normal mood and affect. Her behavior is normal. Thought content normal.  Vitals reviewed.    Assessment & Plan:    1. Chronic pain syndrome - Begin naproxen (NAPROSYN) 500 MG tablet; Take 1 tablet (500 mg total) by mouth 2 (two) times daily with a meal.  Dispense: 30 tablet; Refill: 0 - Begin vitamin B-12 (CYANOCOBALAMIN) 500 MCG tablet; Take 1 tablet (500 mcg total) by mouth daily.  Dispense: 30 tablet; Refill: 1 - Increase gabapentin 300 mg to TID instead of qday - Refill Omeprazole 20 mg   2. Depression with anxiety - Begin escitalopram (LEXAPRO) 20 MG tablet; Take 1 tablet (20 mg total) by mouth daily.  Dispense: 30 tablet; Refill: 2 - Begin clonazePAM (KLONOPIN) 0.5 MG tablet; Take 1 tablet (0.5 mg total) by mouth at bedtime.  Dispense: 30 tablet; Refill: 0  3. Fatigue,  unspecified type - Begin Cholecalciferol (VITAMIN D-3) 5000 units TABS; Take 1 tablet by mouth daily.  Dispense: 30 tablet; Refill: 1  4. Blurring of visual image of right eye - Ambulatory referral to Ophthalmology  5. Hypertension, unspecified type - Begin hydrochlorothiazide (HYDRODIURIL) 12.5 MG tablet; Take 1 tablet (12.5 mg total) by mouth daily.  Dispense: 30 tablet; Refill: 5 - Begin  losartan (COZAAR) 50 MG tablet; Take 1 tablet (50 mg total) by mouth daily.  Dispense: 30 tablet; Refill: 5 - CBC with Differential - Comprehensive metabolic panel - TSH - Lipid Panel  6. Vitamin D deficiency - Cholecalciferol (VITAMIN D-3) 5000 units TABS; Take 1 tablet by mouth daily.  Dispense: 30 tablet; Refill: 1   Meds ordered this encounter  Medications  . naproxen (NAPROSYN) 500 MG tablet    Sig: Take 1 tablet (500 mg total) by mouth 2 (two) times daily with a meal.    Dispense:  30 tablet    Refill:  0    Order Specific Question:   Supervising Provider    Answer:   Quentin AngstJEGEDE, OLUGBEMIGA E [id number]]  . escitalopram (LEXAPRO) 20 MG tablet    Sig: Take 1 tablet (20 mg total) by mouth daily.    Dispense:  30 tablet    Refill:  2    Order Specific Question:   Supervising Provider    Answer:   Quentin AngstJEGEDE, OLUGBEMIGA E [id number]]  . Cholecalciferol (VITAMIN D-3) 5000 units TABS    Sig: Take 1 tablet by mouth daily.    Dispense:  30 tablet    Refill:  1    Order Specific Question:   Supervising Provider    Answer:   Quentin AngstJEGEDE, OLUGBEMIGA E [id number]]  . hydrochlorothiazide (HYDRODIURIL) 12.5 MG tablet    Sig: Take 1 tablet (12.5 mg total) by mouth daily.    Dispense:  30 tablet    Refill:  5    Order Specific Question:   Supervising Provider    Answer:   Quentin AngstJEGEDE, OLUGBEMIGA E [id number]]  . losartan (COZAAR) 50 MG tablet    Sig: Take 1 tablet (50 mg total) by mouth daily.    Dispense:  30 tablet    Refill:  5    Order Specific Question:   Supervising Provider    Answer:   Quentin AngstJEGEDE, OLUGBEMIGA E [id number]  . vitamin B-12 (CYANOCOBALAMIN) 500 MCG tablet    Sig: Take 1 tablet (500 mcg total) by mouth daily.    Dispense:  30 tablet    Refill:  1    Order Specific Question:   Supervising Provider    Answer:   Quentin AngstJEGEDE, OLUGBEMIGA E [id number]  . clonazePAM (KLONOPIN) 0.5 MG tablet    Sig: Take 1 tablet (0.5 mg total) by mouth at bedtime.    Dispense:  30 tablet    Refill:  0    Order  Specific Question:   Supervising Provider    Answer:   Quentin AngstJEGEDE, OLUGBEMIGA E [[id number]]    Follow-up:  4 weeks  Loletta Specteroger David Gomez PA

## 2017-08-09 NOTE — Patient Instructions (Signed)
Trastorno depresivo mayor en los adultos (Major Depressive Disorder, Adult) El trastorno depresivo mayor es una enfermedad mental. Tambin se la conoce como depresin clnica o depresin unipolar. Este trastorno suele causar sentimientos de tristeza, desesperanza o impotencia. Tambin puede provocar sntomas fsicos. Puede afectar el desempeo laboral o acadmico, las relaciones personales y otras actividades cotidianas. El trastorno puede ser leve, moderado o grave. Puede ocurrir una sola vez (trastorno depresivo mayor aislado) o varias veces (trastorno depresivo mayor recurrente). CAUSAS Se desconoce la causa exacta de esta afeccin. Suele ser causada por una combinacin de factores, que pueden incluir los siguientes:  Factores genticos. Son rasgos que se transmiten de los padres a los hijos.  Factores individuales. Su personalidad, comportamiento y la manera en que domina sus pensamientos y sentimientos pueden contribuir al trastorno depresivo mayor. Esto incluye rasgos de personalidad y comportamientos adquiridos de otras personas.  Factores fsicos, tales como:  Diferencias en la regin del cerebro que controla las emociones. Esta regin del cerebro puede ser distinta de la de las personas que no padecen este trastorno.  Enfermedades mdicas o psiquitricas a largo plazo (crnicas).  Factores sociales. Las experiencias traumticas o los cambios importantes en la vida de una persona pueden contribuir al trastorno depresivo mayor. FACTORES DE RIESGO Es ms probable que esta afeccin se manifieste en las mujeres. Los siguientes factores pueden hacer que usted sea propenso a padecer un trastorno depresivo mayor:  Un historial familiar de depresin.  Relaciones familiares conflictivas.  Niveles anormalmente bajos de ciertas sustancias qumicas en el cerebro.  Eventos traumticos en la infancia, especialmente el maltrato o la prdida de un padre.  Vivir muchas situaciones de estrs o  sufrir estrs crnico, en especial debido a experiencias de vida desagradables o prdidas.  Tener antecedentes de lo siguiente:  Enfermedad fsica crnica.  Otros trastornos mentales.  Consumo de drogas.  Condiciones de vida deficientes.  Sufrir exclusin o discriminacin social a diario. SNTOMAS Los principales sntomas del trastorno depresivo mayor, por lo general, incluyen los siguientes:  Depresin o irritabilidad constantes.  Prdida de inters en cosas y actividades. Otros sntomas pueden ser los siguientes:  Dormir o comer demasiado o muy poco.  Cambios imprevistos en el peso.  Estar fatigado o falto de energa.  Sentimientos de inutilidad o culpa.  Dificultad para pensar con claridad o tomar decisiones.  Pensamientos suicidas o de lastimar a otras personas.  Agitacin o debilidad fsicas.  Aislarse. Los casos graves del trastorno depresivo mayor pueden implicar otros sntomas, como por ejemplo:  Ideas delirantes o alucinaciones, que hacen imaginar cosas que no son reales (depresin psictica).  Depresin leve que dura como mnimo un ao (depresin crnica o trastorno depresivo persistente).  Sentimientos intensos de tristeza y desesperanza (depresin melanclica).  Dificultad para hablar y moverse (depresin catatnica). DIAGNSTICO Esta afeccin se puede diagnosticar en funcin de lo siguiente:  Sus sntomas.  Sus antecedentes mdicos, incluido su historial de salud mental. Esto podra incluir exmenes para evaluar su salud mental. Es posible que deba responder preguntas sobre su estilo de vida, incluso si consume drogas o alcohol, y por cunto tiempo ha tenido sntomas del trastorno depresivo mayor.  Un examen fsico.  Anlisis de sangre para descartar otras afecciones. Para que el mdico confirme un diagnstico de trastorno depresivo mayor, usted debe tener un estado de nimo depresivo y al menos cuatro sntomas durante la mayor parte del da, casi  todos los das, en un plazo de dos semanas. TRATAMIENTO Los profesionales que por lo general tratan este   trastorno pertenecen al rea de la salud mental, como psiclogos, psiquiatras y trabajadores sociales clnicos. Puede ser que necesite ms de un tipo de tratamiento. El tratamiento puede incluir lo siguiente:  Psicoterapia. Esto tambin se puede denominar apoyo psicolgico o asistencia psicolgica. Los tipos de psicoterapia incluyen lo siguiente:  Terapia cognitivo conductual (TCC). Este tipo de terapia le ensea a reconocer sentimientos, pensamientos y comportamientos poco saludables, y a reemplazarlos con pensamientos y acciones positivas.  Terapia interpersonal (TI). Lo ayuda a mejorar la manera en que se relaciona y comunica con los dems.  Terapia familiar. Este tratamiento incluye a los miembros de la familia.  Medicamentos para controlar la ansiedad y la depresin, o para ayudarlo a controlar ciertas emociones y comportamientos.  Cambios de estilo de vida tales como:  Limitar el consumo de alcohol y drogas.  Hacer actividad fsica con regularidad.  Dormir lo suficiente.  Optar por alimentos saludables.  Pasar ms tiempo al aire libre. Se pueden realizar tratamientos que implican la estimulacin cerebral en caso de que haya sntomas muy graves, o cuando los medicamentos u otros tratamientos no funcionen. Estos tratamientos incluyen terapia electroconvulsiva, estimulacin magntica transcraneal y estimulacin del nervio vago. INSTRUCCIONES PARA EL CUIDADO EN EL HOGAR Actividad  Retome sus actividades normales como se lo haya indicado el mdico.  Haga actividad fsica con frecuencia y pase tiempo al aire libre como se lo haya indicado el mdico. Instrucciones generales  Tome los medicamentos de venta libre y los recetados solamente como se lo haya indicado el mdico.  No beba alcohol. Si toma alcohol, limite el consumo a no ms de 1medida por da si es mujer y no est  embarazada, y 2medidas por da si es hombre. Una medida equivale a 12onzas de cerveza, 5onzas de vino o 1onzas de bebidas alcohlicas de alta graduacin. El alcohol puede inhibir el efecto de los antidepresivos. Hable con el mdico sobre el consumo de alcohol.  Siga una dieta saludable y duerma lo suficiente.  Encuentre actividades que disfrute y hgase el tiempo para practicarlas.  Considere la posibilidad de participar en un grupo de apoyo. El mdico podra recomendarle un grupo de apoyo.  Concurra a todas las visitas de control como se lo haya indicado el mdico. Esto es importante. SOLICITE ATENCIN MDICA SI:  Los sntomas empeoran.  Presenta nuevos sntomas. SOLICITE ATENCIN MDICA DE INMEDIATO SI:  Se autoagrede.  Tiene pensamientos serios acerca de lastimarse a usted mismo o daar a otras personas.  Ve, oye, saborea, huele o siente cosas que no estn presentes (alucinaciones). PARA OBTENER MS INFORMACIN Alianza Nacional sobre Enfermedades Mentales (National Alliance on Mental Illness)  www.nami.org Instituto Nacional de la Salud Mental de [place] (U.S. National Institute of Mental Health)  www.nimh.nih.gov Lnea Telefnica Nacional para la Prevencin del Suicidio (National Suicide Prevention Lifeline)  1-800-273-TALK (8255). La asistencia es gratuita y est disponible las 24horas. Esta informacin no tiene como fin reemplazar el consejo del mdico. Asegrese de hacerle al mdico cualquier pregunta que tenga. Document Released: 01/26/2013 Document Revised: 10/22/2014 Document Reviewed: 04/11/2016 Elsevier Interactive Patient Education  2017 Elsevier Inc.  

## 2017-08-09 NOTE — Progress Notes (Signed)
Pt complains of vision problems with right eye Pt complains of inflammation on the right side from the neck down the side to the bottom of her foot

## 2017-08-10 LAB — COMPREHENSIVE METABOLIC PANEL WITH GFR
ALT: 17 [IU]/L (ref 0–32)
AST: 20 [IU]/L (ref 0–40)
Albumin/Globulin Ratio: 1.3 (ref 1.2–2.2)
Albumin: 4.1 g/dL (ref 3.6–4.8)
Alkaline Phosphatase: 117 [IU]/L (ref 39–117)
BUN/Creatinine Ratio: 20 (ref 12–28)
BUN: 16 mg/dL (ref 8–27)
Bilirubin Total: <0.2 mg/dL (ref 0.0–1.2)
CO2: 26 mmol/L (ref 20–29)
Calcium: 9.3 mg/dL (ref 8.7–10.3)
Chloride: 102 mmol/L (ref 96–106)
Creatinine, Ser: 0.79 mg/dL (ref 0.57–1.00)
GFR calc Af Amer: 92 mL/min/{1.73_m2}
GFR calc non Af Amer: 80 mL/min/{1.73_m2}
Globulin, Total: 3.1 g/dL (ref 1.5–4.5)
Glucose: 86 mg/dL (ref 65–99)
Potassium: 4.4 mmol/L (ref 3.5–5.2)
Sodium: 141 mmol/L (ref 134–144)
Total Protein: 7.2 g/dL (ref 6.0–8.5)

## 2017-08-10 LAB — LIPID PANEL
Chol/HDL Ratio: 4 ratio (ref 0.0–4.4)
Cholesterol, Total: 206 mg/dL — ABNORMAL HIGH (ref 100–199)
HDL: 52 mg/dL
LDL Calculated: 111 mg/dL — ABNORMAL HIGH (ref 0–99)
Triglycerides: 217 mg/dL — ABNORMAL HIGH (ref 0–149)
VLDL Cholesterol Cal: 43 mg/dL — ABNORMAL HIGH (ref 5–40)

## 2017-08-10 LAB — CBC WITH DIFFERENTIAL/PLATELET
Basophils Absolute: 0 10*3/uL (ref 0.0–0.2)
Basos: 0 %
EOS (ABSOLUTE): 0.1 10*3/uL (ref 0.0–0.4)
Eos: 1 %
Hematocrit: 38.6 % (ref 34.0–46.6)
Hemoglobin: 13 g/dL (ref 11.1–15.9)
Immature Grans (Abs): 0 10*3/uL (ref 0.0–0.1)
Immature Granulocytes: 0 %
Lymphocytes Absolute: 1.8 10*3/uL (ref 0.7–3.1)
Lymphs: 32 %
MCH: 31.4 pg (ref 26.6–33.0)
MCHC: 33.7 g/dL (ref 31.5–35.7)
MCV: 93 fL (ref 79–97)
Monocytes Absolute: 0.4 10*3/uL (ref 0.1–0.9)
Monocytes: 8 %
Neutrophils Absolute: 3.1 10*3/uL (ref 1.4–7.0)
Neutrophils: 59 %
Platelets: 341 10*3/uL (ref 150–379)
RBC: 4.14 x10E6/uL (ref 3.77–5.28)
RDW: 14.3 % (ref 12.3–15.4)
WBC: 5.4 10*3/uL (ref 3.4–10.8)

## 2017-08-10 LAB — TSH: TSH: 2.66 u[IU]/mL (ref 0.450–4.500)

## 2017-08-12 ENCOUNTER — Other Ambulatory Visit (INDEPENDENT_AMBULATORY_CARE_PROVIDER_SITE_OTHER): Payer: Self-pay | Admitting: Physician Assistant

## 2017-08-12 DIAGNOSIS — E785 Hyperlipidemia, unspecified: Secondary | ICD-10-CM

## 2017-08-12 MED ORDER — ATORVASTATIN CALCIUM 20 MG PO TABS: 20.0000 mg | ORAL_TABLET | Freq: Every day | ORAL | 3 refills | Status: DC

## 2017-08-14 ENCOUNTER — Telehealth: Payer: Self-pay | Admitting: Physician Assistant

## 2017-08-14 ENCOUNTER — Telehealth (INDEPENDENT_AMBULATORY_CARE_PROVIDER_SITE_OTHER): Payer: Self-pay

## 2017-08-14 ENCOUNTER — Telehealth (INDEPENDENT_AMBULATORY_CARE_PROVIDER_SITE_OTHER): Payer: Self-pay | Admitting: Physician Assistant

## 2017-08-14 NOTE — Telephone Encounter (Signed)
Patient called requesting her cholesterol medication to be sent to [facility] [place] Thank you

## 2017-08-14 NOTE — Telephone Encounter (Signed)
Pt. Called and was informed of lab results.

## 2017-08-14 NOTE — Telephone Encounter (Signed)
Using pac[employer] interpreters [phone]([id number]) left patient a message detailing normal labs, except for mildly elevated cholesterol. Medication [facility] [place], and to call office with any questions. Maryjean Mornempestt S Roberts, CMA

## 2017-08-14 NOTE — Telephone Encounter (Signed)
-----   Message from Loletta Specteroger David Gomez, PA-C sent at 08/12/2017  5:33 PM EDT ----- All labs normal except for mildly elevated cholesterol. [facility] [place] [facility].

## 2017-08-15 ENCOUNTER — Other Ambulatory Visit (INDEPENDENT_AMBULATORY_CARE_PROVIDER_SITE_OTHER): Payer: Self-pay | Admitting: Physician Assistant

## 2017-08-15 DIAGNOSIS — E785 Hyperlipidemia, unspecified: Secondary | ICD-10-CM

## 2017-08-15 MED ORDER — ATORVASTATIN CALCIUM 20 MG PO TABS: 20.0000 mg | ORAL_TABLET | Freq: Every day | ORAL | 3 refills | Status: DC

## 2017-08-15 NOTE — Telephone Encounter (Signed)
FWD to PCP. Ninnie Fein S Caulder Wehner, CMA  

## 2017-08-15 NOTE — Telephone Encounter (Signed)
[facility] to [facility]

## 2017-09-04 ENCOUNTER — Ambulatory Visit (INDEPENDENT_AMBULATORY_CARE_PROVIDER_SITE_OTHER): Payer: Self-pay | Admitting: Physician Assistant

## 2017-09-09 ENCOUNTER — Other Ambulatory Visit: Payer: Self-pay

## 2017-09-09 ENCOUNTER — Emergency Department: Payer: BLUE CROSS/BLUE SHIELD

## 2017-09-09 ENCOUNTER — Emergency Department
Admission: EM | Admit: 2017-09-09 | Discharge: 2017-09-09 | Disposition: A | Discharge status: Home / Self Care | Payer: BLUE CROSS/BLUE SHIELD | Attending: Student in an Organized Health Care Education/Training Program | Admitting: Student in an Organized Health Care Education/Training Program

## 2017-09-09 ENCOUNTER — Encounter: Payer: Self-pay | Admitting: Emergency Medicine

## 2017-09-09 DIAGNOSIS — R519 Headache, unspecified: Secondary | ICD-10-CM

## 2017-09-09 DIAGNOSIS — R079 Chest pain, unspecified: Secondary | ICD-10-CM

## 2017-09-09 DIAGNOSIS — R51 Headache: Secondary | ICD-10-CM | POA: Diagnosis not present

## 2017-09-09 DIAGNOSIS — I2510 Atherosclerotic heart disease of native coronary artery without angina pectoris: Secondary | ICD-10-CM | POA: Diagnosis not present

## 2017-09-09 DIAGNOSIS — I10 Essential (primary) hypertension: Secondary | ICD-10-CM | POA: Diagnosis not present

## 2017-09-09 DIAGNOSIS — Z79899 Other long term (current) drug therapy: Secondary | ICD-10-CM | POA: Diagnosis not present

## 2017-09-09 LAB — CBC
HCT: 38.4 % (ref 35.0–47.0)
Hemoglobin: 12.9 g/dL (ref 12.0–16.0)
MCH: 31 pg (ref 26.0–34.0)
MCHC: 33.7 g/dL (ref 32.0–36.0)
MCV: 92 fL (ref 80.0–100.0)
Platelets: 295 10*3/uL (ref 150–440)
RBC: 4.17 MIL/uL (ref 3.80–5.20)
RDW: 14.4 % (ref 11.5–14.5)
WBC: 5.1 10*3/uL (ref 3.6–11.0)

## 2017-09-09 LAB — BASIC METABOLIC PANEL
Anion gap: 8 (ref 5–15)
BUN: 14 mg/dL (ref 6–20)
CO2: 27 mmol/L (ref 22–32)
Calcium: 8.9 mg/dL (ref 8.9–10.3)
Chloride: 104 mmol/L (ref 101–111)
Creatinine, Ser: 0.66 mg/dL (ref 0.44–1.00)
GFR calc Af Amer: >60 mL/min (ref >60)
GFR calc non Af Amer: >60 mL/min (ref >60)
Glucose, Bld: 108 mg/dL — ABNORMAL HIGH (ref 65–99)
Potassium: 3.7 mmol/L (ref 3.5–5.1)
Sodium: 139 mmol/L (ref 135–145)

## 2017-09-09 LAB — TROPONIN I: Troponin I: <0.03 ng/mL (ref <0.03)

## 2017-09-09 IMAGING — CR DG CHEST 2V
2 series · 2 of 2 positions shown · non-contrast
Comparison: None.

CLINICAL DATA: Pt arrived via EMS from home for reports of central
chest pain radiating to left jaw and arm that began last night.
Describes pain as gradually worsening and reports associated SOB,
dizziness and headache

EXAM:
CHEST  2 VIEW

[chest pa]
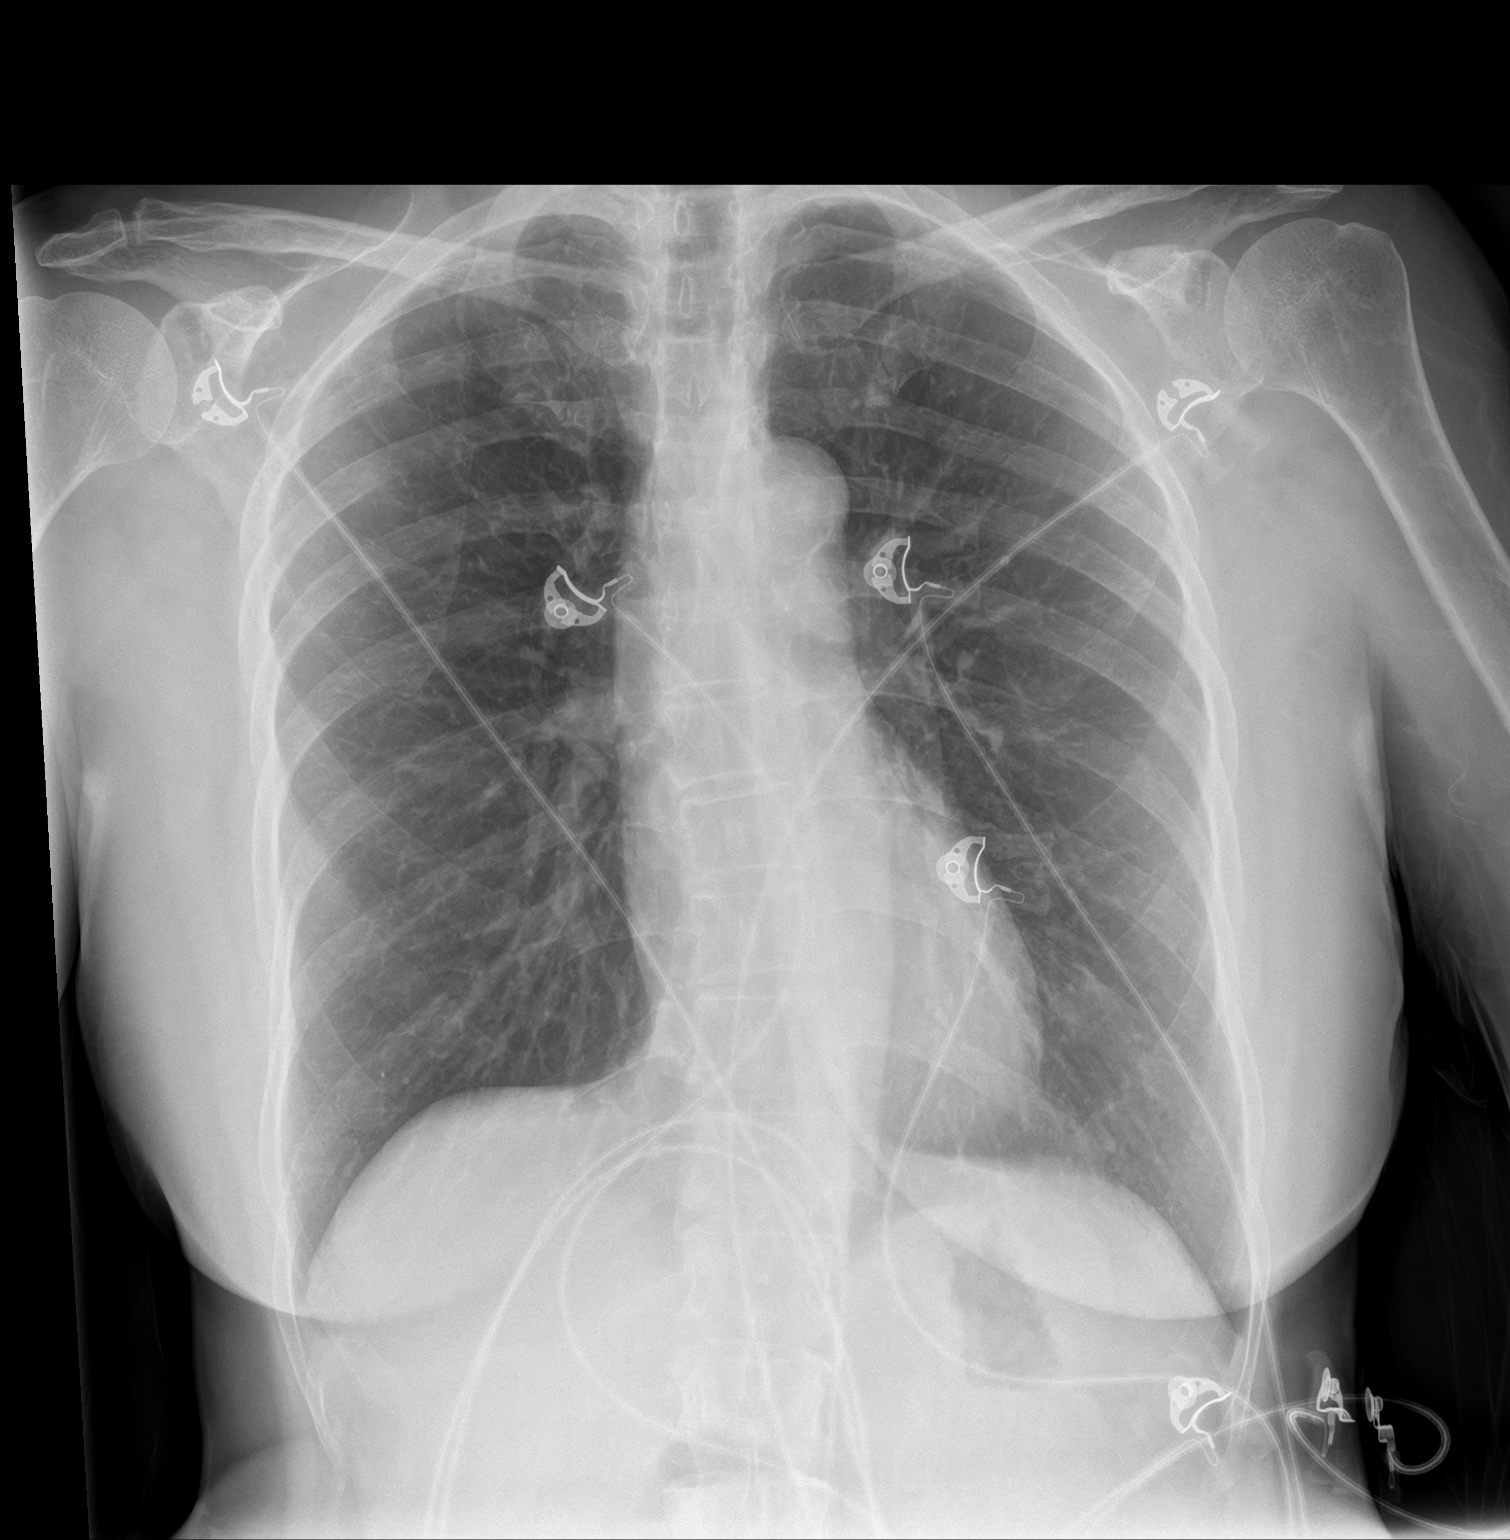

[chest lat]
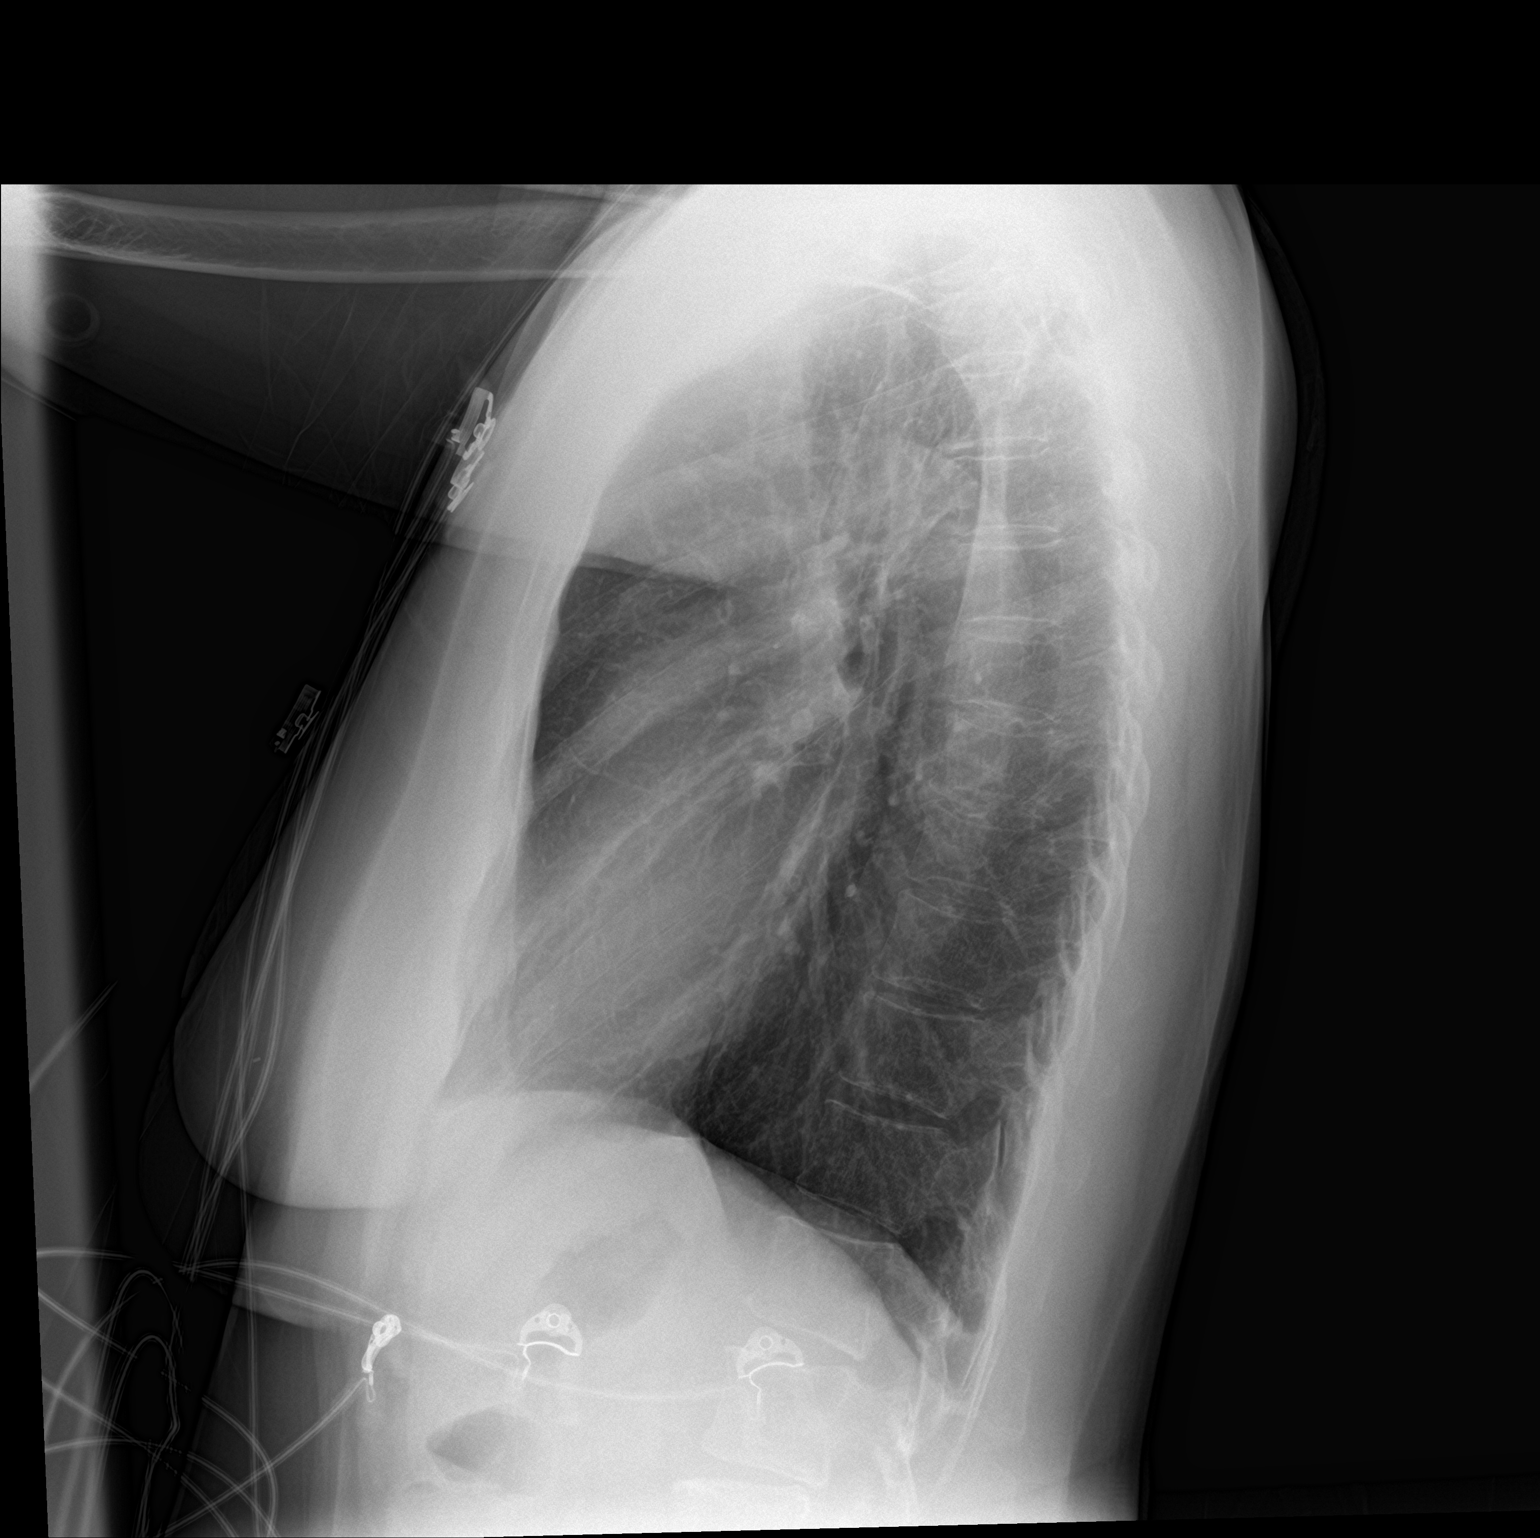

[2 of 2 positions shown; findings below may reference images not displayed]

FINDINGS: Lungs are mildly hyperinflated. Heart size is normal. No focal
consolidations, pleural effusions, or pulmonary edema. A feeding
density overlying the right lung apex raises the question of
pulmonary mass.
IMPRESSION: 1. No pulmonary edema.
2. Question of right upper lobe nodule warranting further
evaluation. Consider nonemergent CT of the chest for further
evaluation.

## 2017-09-09 MED ORDER — PROCHLORPERAZINE EDISYLATE 5 MG/ML IJ SOLN
10.0000 mg | Freq: Once | INTRAMUSCULAR | Status: AC
  Administered 2017-09-09: 10 mg via INTRAVENOUS
  Filled 2017-09-09: qty 2

## 2017-09-09 MED ORDER — ACETAMINOPHEN 325 MG PO TABS
650.0000 mg | ORAL_TABLET | Freq: Once | ORAL | Status: AC
  Administered 2017-09-09: 650 mg via ORAL
  Filled 2017-09-09: qty 2

## 2017-09-09 MED ORDER — DIPHENHYDRAMINE HCL 50 MG/ML IJ SOLN
12.5000 mg | Freq: Once | INTRAMUSCULAR | Status: AC
  Administered 2017-09-09: 12.5 mg via INTRAVENOUS
  Filled 2017-09-09: qty 1

## 2017-09-09 MED ORDER — MORPHINE SULFATE (PF) 4 MG/ML IV SOLN: 4.0000 mg | INTRAVENOUS | Status: DC | PRN

## 2017-09-09 NOTE — ED Provider Notes (Signed)
[facility] Emergency Department Provider Note    First MD Initiated Contact with Patient 09/09/17 1111     (approximate)  I have reviewed the triage vital signs and the nursing notes.   HISTORY  Chief Complaint Chest Pain    HPI Amy Cline is a 61 y.o. female with a history of CAD, fibromyalgia as well as chronic headaches presents with central chest pain radiating to the arm and jaw since last night as well as mild headache.  Patient took aspirin and nitroglycerin without any relief.  States the headache and chest pain feels similar to previous episodes.  Denies any fevers.  No worsening pain with exertion.  No nausea.  No diaphoresis.  Pain is initially reported as being mild to moderate but has been constant since last night the patient has not had much sleep.  Daughter at bedside states that the patient has been under more stress recently related to relationship separation.  Past Medical History:  Diagnosis Date  . Bladder prolapse, female, acquired   . Breast mass   . Chronic headaches   . Coronary artery disease   . Fibromyalgia   . GERD (gastroesophageal reflux disease)   . Hyperlipemia   . Hypertension   . Insomnia   . Prediabetes   . Vitamin D deficiency    Family History  Problem Relation Age of Onset  . Migraines Neg Hx    Past Surgical History:  Procedure Laterality Date  . ABDOMINAL HYSTERECTOMY  2008  . APPENDECTOMY    . BREAST BIOPSY     Patient Active Problem List   Diagnosis Date Noted  . Atypical chest pain 07/17/2016  . Essential hypertension 03/29/2016  . Fibromyalgia 03/29/2016  . Gastroesophageal reflux disease without esophagitis 03/29/2016  . Chronic tension-type headache, not intractable 08/16/2015      Prior to Admission medications   Medication Sig Start Date End Date Taking? Authorizing Provider  atorvastatin (LIPITOR) 20 MG tablet Take 1 tablet (20 mg total) by mouth daily. 08/15/17   Loletta Specter,  PA-C  Cholecalciferol (VITAMIN D-3) 5000 units TABS Take 1 tablet by mouth daily. 08/09/17   Loletta Specter, PA-C  clonazePAM (KLONOPIN) 0.5 MG tablet Take 1 tablet (0.5 mg total) by mouth at bedtime. 08/09/17   Loletta Specter, PA-C  escitalopram (LEXAPRO) 20 MG tablet Take 1 tablet (20 mg total) by mouth daily. 08/09/17   Loletta Specter, PA-C  gabapentin (NEURONTIN) 300 MG capsule Take 1 capsule (300 mg total) by mouth 3 (three) times daily as needed. 08/09/17   Loletta Specter, PA-C  hydrochlorothiazide (HYDRODIURIL) 12.5 MG tablet Take 1 tablet (12.5 mg total) by mouth daily. 08/09/17   Loletta Specter, PA-C  losartan (COZAAR) 50 MG tablet Take 1 tablet (50 mg total) by mouth daily. 08/09/17   Loletta Specter, PA-C  naproxen (NAPROSYN) 500 MG tablet Take 1 tablet (500 mg total) by mouth 2 (two) times daily with a meal. 08/09/17   Loletta Specter, PA-C  omeprazole (PRILOSEC) 20 MG capsule Take 1 capsule (20 mg total) by mouth daily. 08/09/17   Loletta Specter, PA-C  vitamin B-12 (CYANOCOBALAMIN) 500 MCG tablet Take 1 tablet (500 mcg total) by mouth daily. 08/09/17   Loletta Specter, PA-C    Allergies Patient has no known allergies.    Social History Social History   Tobacco Use  . Smoking status: Never Smoker  . Smokeless tobacco: Never Used  Substance Use Topics  .  Alcohol use: No  . Drug use: No    Review of Systems Patient denies headaches, rhinorrhea, blurry vision, numbness, shortness of breath, chest pain, edema, cough, abdominal pain, nausea, vomiting, diarrhea, dysuria, fevers, rashes or hallucinations unless otherwise stated above in HPI. ____________________________________________   PHYSICAL EXAM:  VITAL SIGNS: Vitals:   09/09/17 1405 09/09/17 1500  BP: (!) 145/95 (!) 141/96  Pulse: 70 73  Resp: 12 13  Temp:    SpO2: 97% 97%    Constitutional: Alert and oriented. Well appearing and in no acute distress. Eyes: Conjunctivae are  normal.  Head: Atraumatic. Nose: No congestion/rhinnorhea. Mouth/Throat: Mucous membranes are moist.   Neck: No stridor. Painless ROM.  Cardiovascular: Normal rate, regular rhythm. Grossly normal heart sounds.  Good peripheral circulation. Respiratory: Normal respiratory effort.  No retractions. Lungs CTAB. Gastrointestinal: Soft and nontender. No distention. No abdominal bruits. No CVA tenderness. Genitourinary:  Musculoskeletal: No lower extremity tenderness nor edema.  No joint effusions. Neurologic:  Normal speech and language. No gross focal neurologic deficits are appreciated. No facial droop Skin:  Skin is warm, dry and intact. No rash noted. Psychiatric: Mood and affect are normal. Speech and behavior are normal.  ____________________________________________   LABS (all labs ordered are listed, but only abnormal results are displayed)  Results for orders placed or performed during the hospital encounter of 09/09/17 (from the past 24 hour(s))  CBC     Status: None   Collection Time: 09/09/17 10:54 AM  Result Value Ref Range   WBC 5.1 3.6 - 11.0 K/uL   RBC 4.17 3.80 - 5.20 MIL/uL   Hemoglobin 12.9 12.0 - 16.0 g/dL   HCT 40.138.4 02.735.0 - 25.347.0 %   MCV 92.0 80.0 - 100.0 fL   MCH 31.0 26.0 - 34.0 pg   MCHC 33.7 32.0 - 36.0 g/dL   RDW 66.414.4 40.311.5 - 47.414.5 %   Platelets 295 150 - 440 K/uL  Basic metabolic panel     Status: Abnormal   Collection Time: 09/09/17 12:36 PM  Result Value Ref Range   Sodium 139 135 - 145 mmol/L   Potassium 3.7 3.5 - 5.1 mmol/L   Chloride 104 101 - 111 mmol/L   CO2 27 22 - 32 mmol/L   Glucose, Bld 108 (H) 65 - 99 mg/dL   BUN 14 6 - 20 mg/dL   Creatinine, Ser 2.590.66 0.44 - 1.00 mg/dL   Calcium 8.9 8.9 - 56.310.3 mg/dL   GFR calc non Af Amer >60 >60 mL/min   GFR calc Af Amer >60 >60 mL/min   Anion gap 8 5 - 15  Troponin I     Status: None   Collection Time: 09/09/17 12:36 PM  Result Value Ref Range   Troponin I <0.03 <0.03 ng/mL    ____________________________________________  EKG My review and personal interpretation at Time: 10:53   Indication: chest pain  Rate: 80  Rhythm: sinus Axis: normal Other: normal intervals, no stemi, no st or t wave changes ____________________________________________  RADIOLOGY  I personally reviewed all radiographic images ordered to evaluate for the above acute complaints and reviewed radiology reports and findings.  These findings were personally discussed with the patient.  Please see medical record for radiology report.  ____________________________________________   PROCEDURES  Procedure(s) performed:  Procedures    Critical Care performed: no ____________________________________________   INITIAL IMPRESSION / ASSESSMENT AND PLAN / ED COURSE  Pertinent labs & imaging results that were available during my care of the patient were reviewed by  me and considered in my medical decision making (see chart for details).  DDX: ACS, pericarditis, esophagitis, boerhaaves, pe, dissection, pna, bronchitis, costochondritis   Idamae SchullerLucina Loletha CarrowBruner is a 61 y.o. who presents to the ED with symptoms as described above.  EKG shows no changes.  Will check troponin to further stratify though this seems less consistent with ACS based on her multiple complaints.  Does have history of chronic headaches and no focal neuro deficits at this time.  We will give her IV migraine cocktail.  Her abdominal exam is soft and benign.  This not clinically consistent with pulmonary embolism, pericarditis, dissection.  Does have a component of musculoskeletal pain and does have a history of fibromyalgia which could be contributing to her presentation.  Will observe patient while obtaining blood work and serial exams.  Clinical Course as of Sep 09 1605  Mon Sep 09, 2017  1309 Basic metabolic panel [PR]  1434 Reassessed and is chest pain-free.  Do not feel this is clinically consistent with ACS as her troponin is  negative after almost 1 day of discomfort.  No white count.  No evidence of infectious process.  She is well-appearing and in no acute distress.  I do believe she is stable and appropriate for follow-up with cardiology as an outpatient.  [PR]    Clinical Course User Index [PR] Willy Eddyobinson, Sanita Estrada, MD     ____________________________________________   FINAL CLINICAL IMPRESSION(S) / ED DIAGNOSES  Final diagnoses:  Chest pain, unspecified type  Acute nonintractable headache, unspecified headache type      NEW MEDICATIONS STARTED DURING THIS VISIT:  This SmartLink is deprecated. Use AVSMEDLIST instead to display the medication list for a patient.   Note:  This document was prepared using Dragon voice recognition software and may include unintentional dictation errors.    Willy Eddyobinson, Amaan Meyer, MD 09/09/17 [phone]

## 2017-09-09 NOTE — ED Notes (Signed)
E-signature box not working. Pt verbalized understanding of discharge instructions and denied questions. Interpreter at bedside.

## 2017-09-09 NOTE — ED Notes (Signed)
Pt also reports intermittent headaches for over one month. Pt reports pain on top of her head. Pt is tearful when describing headaches.

## 2017-09-09 NOTE — ED Triage Notes (Signed)
Pt arrived via EMS from home for reports of central chest pain radiating to left jaw and arm that began last night. EMS administered 324 mg ASA and I spray NTG without relief. EMS reports VSS, NSR, 12-lead unremarkable. Pt alert and oriented on arrival. Describes pain as gradually worsening and reports associated SOB, dizziness and headache.

## 2017-09-13 ENCOUNTER — Ambulatory Visit (INDEPENDENT_AMBULATORY_CARE_PROVIDER_SITE_OTHER): Payer: BLUE CROSS/BLUE SHIELD | Admitting: Physician Assistant

## 2017-09-13 ENCOUNTER — Encounter (INDEPENDENT_AMBULATORY_CARE_PROVIDER_SITE_OTHER): Payer: Self-pay | Admitting: Physician Assistant

## 2017-09-13 VITALS — BP 126/87 | HR 72 | Temp 98.0°F | Resp 18 | Ht 64.0 in | Wt 144.0 lb

## 2017-09-13 DIAGNOSIS — M79641 Pain in right hand: Secondary | ICD-10-CM

## 2017-09-13 DIAGNOSIS — M79642 Pain in left hand: Secondary | ICD-10-CM | POA: Diagnosis not present

## 2017-09-13 DIAGNOSIS — F418 Other specified anxiety disorders: Secondary | ICD-10-CM

## 2017-09-13 DIAGNOSIS — I10 Essential (primary) hypertension: Secondary | ICD-10-CM | POA: Diagnosis not present

## 2017-09-13 NOTE — Patient Instructions (Signed)
Dolor de Engineer, sitela mano (Hand Pain) El dolor de la mano puede tener muchas causas. Algunas causas frecuentes son las siguientes:  Una lesin.  Repetir el mismo movimiento con la mano una y otra vez (uso excesivo).  Osteoporosis.  Artritis.  Bultos en los tendones o las articulaciones de la mano y la [place]ca (quistes ganglionares).  Infeccin. INSTRUCCIONES PARA EL CUIDADO EN EL HOGAR Est atento a cualquier cambio en los sntomas. Tome estas medidas para aliviar las molestias:  Si se lo indican, aplique hielo sobre la zona afectada: ? Ponga el hielo en una bolsa plstica. ? Coloque una FirstEnergy Corptoalla entre la piel y la bolsa de hielo. ? Deje el hielo durante 15 a 20 minutos, 3 a 4veces por da, durante 2das.  Tome los medicamentos de venta libre y los recetados solamente como se lo haya indicado el mdico.  Reduzca al McDonald's Corporationmnimo la carga sobre las manos y las muecas tanto como sea posible.  Tome descansos frecuentes cuando realiza actividades repetidas.  Haga estiramientos como se lo haya indicado el mdico.  No haga las actividades que intensifican Chief Technology Officerel dolor. SOLICITE ATENCIN MDICA SI:  El dolor no mejora despus de [address]unos das de cuidados personales.  El dolor [place]empeora.  El dolor afecta su capacidad de Education officer, environmentalrealizar las actividades cotidianas. SOLICITE ATENCIN MDICA DE INMEDIATO SI:  La herida est roja, hinchada o caliente.  La mano se le adormece o tiene hormigueo.  Tiene la mano muy hinchada o deformada.  La mano o los dedos se tornan de color blanco o azul.  No puede mover la mano, la Foot Lockermueca o los dedos. Esta informacin no tiene Theme park managercomo fin reemplazar el consejo del mdico. Asegrese de hacerle al mdico cualquier pregunta que tenga. Document Released: 01/23/2016 Document Revised: 01/23/2016 Document Reviewed: 10/27/2014 Elsevier Interactive Patient Education  [employer]2018 Elsevier Inc.

## 2017-09-13 NOTE — Progress Notes (Signed)
Subjective:  Patient ID: Amy Cline, female    DOB: [birth year]  Age: 61 y.o. MRN: [id number]  CC: f/u  HPI  Amy EdelsonLucina Lesniak is a 61 y.o. female with a medical history of bladder prolapse, herniation of rectum into vagina, breast mass, chronic headaches, CAD, fibromyalgia, GERD, HLD, HTN, insomnia, prediabetes, plantar fascitis, and vitamin D deficiency presents on f/u of depression with anxiety, HTN, and chronic pain syndrome.  Says she is taking medications as prescribed. BP is much better today, depression with anxiety is also moderately better, and has less bodily pain. However, hands still hurt when she sews and right ankle hurts on occasion. Does not endorse any other symptoms.     Outpatient Medications Prior to Visit  Medication Sig Dispense Refill  . atorvastatin (LIPITOR) 20 MG tablet Take 1 tablet (20 mg total) by mouth daily. 90 tablet 3  . Cholecalciferol (VITAMIN D-3) 5000 units TABS Take 1 tablet by mouth daily. 30 tablet 1  . clonazePAM (KLONOPIN) 0.5 MG tablet Take 1 tablet (0.5 mg total) by mouth at bedtime. 30 tablet 0  . escitalopram (LEXAPRO) 20 MG tablet Take 1 tablet (20 mg total) by mouth daily. 30 tablet 2  . gabapentin (NEURONTIN) 300 MG capsule Take 1 capsule (300 mg total) by mouth 3 (three) times daily as needed. 90 capsule 2  . hydrochlorothiazide (HYDRODIURIL) 12.5 MG tablet Take 1 tablet (12.5 mg total) by mouth daily. 30 tablet 5  . losartan (COZAAR) 50 MG tablet Take 1 tablet (50 mg total) by mouth daily. 30 tablet 5  . naproxen (NAPROSYN) 500 MG tablet Take 1 tablet (500 mg total) by mouth 2 (two) times daily with a meal. 30 tablet 0  . omeprazole (PRILOSEC) 20 MG capsule Take 1 capsule (20 mg total) by mouth daily. 30 capsule 2  . vitamin B-12 (CYANOCOBALAMIN) 500 MCG tablet Take 1 tablet (500 mcg total) by mouth daily. 30 tablet 1   No facility-administered medications prior to visit.      ROS Review of Systems  Constitutional: Negative for chills,  fever and malaise/fatigue.  Eyes: Negative for blurred vision.  Respiratory: Negative for shortness of breath.   Cardiovascular: Negative for chest pain and palpitations.  Gastrointestinal: Negative for abdominal pain and nausea.  Genitourinary: Negative for dysuria and hematuria.  Musculoskeletal: Positive for joint pain. Negative for myalgias.  Skin: Negative for rash.  Neurological: Negative for tingling and headaches.  Psychiatric/Behavioral: Positive for depression. The patient is nervous/anxious.     Objective:  Blood pressure 126/87, pulse 72, temperature 98 F (36.7 C), temperature source Oral, resp. rate 18, height 5\' 4"  (1.626 m), weight 144 lb (65.3 kg), head circumference 18" (45.7 cm), SpO2 100 %.   Physical Exam  Constitutional: She is oriented to person, place, and time.  Well developed, well nourished, NAD, polite  HENT:  Head: Normocephalic and atraumatic.  Eyes: No scleral icterus.  Neck: Normal range of motion. Neck supple. No thyromegaly present.  Cardiovascular: Normal rate, regular rhythm and normal heart sounds.  Pulmonary/Chest: Effort normal and breath sounds normal.  Musculoskeletal: She exhibits no edema.  TTP of the MCPs, PIPs, and DIPs of both hands. Right ankle without deformity, mild TTP at the medial malleolus.  Neurological: She is alert and oriented to person, place, and time. No cranial nerve deficit. Coordination normal.  Skin: Skin is warm and dry. No rash noted. No erythema. No pallor.  Psychiatric: She has a normal mood and affect. Her behavior is normal. Thought  content normal.  Vitals reviewed.    Assessment & Plan:   1. Pain in both hands - Sedimentation Rate - C-reactive protein - ANA w/Reflex - Rheumatoid factor  2. Depression with anxiety - Continue Escitalopram 20 mg qday - I have referred to psychology, Ms Jenel LucksJasmine Lewis  3. Essential hypertension - Continue HCTZ or Losartan.     Follow-up: 4 weeks depression with  anxiety  Loletta Specteroger David Chizuko Trine PA

## 2017-09-14 LAB — C-REACTIVE PROTEIN: CRP: 1.7 mg/L (ref 0.0–4.9)

## 2017-09-14 LAB — SEDIMENTATION RATE: Sed Rate: 32 mm/hr (ref 0–40)

## 2017-09-14 LAB — ANA W/REFLEX: Anti Nuclear Antibody(ANA): NEGATIVE

## 2017-09-14 LAB — RHEUMATOID FACTOR: Rhuematoid fact SerPl-aCnc: <10 IU/mL (ref 0.0–13.9)

## 2017-09-18 ENCOUNTER — Telehealth: Payer: Self-pay | Admitting: Licensed Clinical Social Worker

## 2017-09-18 ENCOUNTER — Telehealth (INDEPENDENT_AMBULATORY_CARE_PROVIDER_SITE_OTHER): Payer: Self-pay

## 2017-09-18 NOTE — Telephone Encounter (Signed)
Called patient using [employer], patient did not answer left message for patient to call the office. Maryjean Mornempestt S Roberts, CMA

## 2017-09-18 NOTE — Telephone Encounter (Signed)
[address], Donata ClayKarina [phone](ID#[id number]), was utilized in an attempt to contact pt to address behavioral health. A HIPPA compliant message was left requesting a return call.

## 2017-09-19 ENCOUNTER — Telehealth: Payer: Self-pay | Admitting: Licensed Clinical Social Worker

## 2017-09-19 NOTE — Telephone Encounter (Signed)
[address], Kern AlbertaSergio [phone](ID#[id number]), was utilized in an attempt to contact pt to address behavioral health. A HIPPA compliant message was left requesting a return call.

## 2017-10-10 ENCOUNTER — Other Ambulatory Visit (INDEPENDENT_AMBULATORY_CARE_PROVIDER_SITE_OTHER): Payer: Self-pay | Admitting: Physician Assistant

## 2017-10-10 DIAGNOSIS — F418 Other specified anxiety disorders: Secondary | ICD-10-CM

## 2017-10-10 NOTE — Telephone Encounter (Signed)
FWD to PCP. [name], CMA  

## 2017-10-11 ENCOUNTER — Ambulatory Visit (INDEPENDENT_AMBULATORY_CARE_PROVIDER_SITE_OTHER): Payer: BLUE CROSS/BLUE SHIELD | Admitting: Physician Assistant

## 2017-10-11 ENCOUNTER — Encounter (INDEPENDENT_AMBULATORY_CARE_PROVIDER_SITE_OTHER): Payer: Self-pay | Admitting: Physician Assistant

## 2017-10-11 ENCOUNTER — Other Ambulatory Visit: Payer: Self-pay

## 2017-10-11 VITALS — BP 122/76 | HR 84 | Temp 97.8°F | Ht 63.0 in | Wt 142.0 lb

## 2017-10-11 DIAGNOSIS — E2839 Other primary ovarian failure: Secondary | ICD-10-CM | POA: Diagnosis not present

## 2017-10-11 DIAGNOSIS — Z23 Encounter for immunization: Secondary | ICD-10-CM | POA: Diagnosis not present

## 2017-10-11 DIAGNOSIS — Z0000 Encounter for general adult medical examination without abnormal findings: Secondary | ICD-10-CM | POA: Diagnosis not present

## 2017-10-11 DIAGNOSIS — Z1159 Encounter for screening for other viral diseases: Secondary | ICD-10-CM

## 2017-10-11 DIAGNOSIS — Z114 Encounter for screening for human immunodeficiency virus [HIV]: Secondary | ICD-10-CM | POA: Diagnosis not present

## 2017-10-11 DIAGNOSIS — F418 Other specified anxiety disorders: Secondary | ICD-10-CM

## 2017-10-11 DIAGNOSIS — B373 Candidiasis of vulva and vagina: Secondary | ICD-10-CM | POA: Diagnosis not present

## 2017-10-11 DIAGNOSIS — Z1211 Encounter for screening for malignant neoplasm of colon: Secondary | ICD-10-CM

## 2017-10-11 DIAGNOSIS — Z124 Encounter for screening for malignant neoplasm of cervix: Secondary | ICD-10-CM

## 2017-10-11 DIAGNOSIS — Z1231 Encounter for screening mammogram for malignant neoplasm of breast: Secondary | ICD-10-CM | POA: Diagnosis not present

## 2017-10-11 DIAGNOSIS — B3731 Acute candidiasis of vulva and vagina: Secondary | ICD-10-CM

## 2017-10-11 DIAGNOSIS — Z1239 Encounter for other screening for malignant neoplasm of breast: Secondary | ICD-10-CM

## 2017-10-11 MED ORDER — FLUCONAZOLE 150 MG PO TABS: 150.0000 mg | ORAL_TABLET | Freq: Once | ORAL | 0 refills | Status: AC

## 2017-10-11 MED ORDER — ESCITALOPRAM OXALATE 20 MG PO TABS: 20.0000 mg | ORAL_TABLET | Freq: Every day | ORAL | 2 refills | Status: DC

## 2017-10-11 NOTE — Addendum Note (Signed)
Addended by: Sindy MessingGOMEZ, Yarnell Kozloski D on: 10/11/2017 10:55 AM   Modules accepted: Orders

## 2017-10-11 NOTE — Progress Notes (Signed)
Subjective:  Patient ID: Amy Cline, female    DOB: 08/12/1956  Age: 61 y.o. MRN: [id number]  CC: annual exam  HPI   Amy Bruneris a 61 y.o.femalewith a medical history of bladder prolapse, herniation of rectum into vagina, breast mass, chronic headaches, CAD, fibromyalgia, GERD, HLD, HTN, insomnia, prediabetes, plantar fascitis, and vitamin D deficiency presents for an annual physical exam. Says she will need a refill of escitalopram which has been moderately effective in controlling her anxiety. Says her anxiety is triggered "once in a while" but not as frequently as before. Does not endorse any other symptoms or complaints.      Outpatient Medications Prior to Visit  Medication Sig Dispense Refill  . atorvastatin (LIPITOR) 20 MG tablet Take 1 tablet (20 mg total) by mouth daily. 90 tablet 3  . Cholecalciferol (VITAMIN D-3) 5000 units TABS Take 1 tablet by mouth daily. 30 tablet 1  . clonazePAM (KLONOPIN) 0.5 MG tablet Take 1 tablet (0.5 mg total) by mouth at bedtime. 30 tablet 0  . escitalopram (LEXAPRO) 20 MG tablet Take 1 tablet (20 mg total) by mouth daily. 30 tablet 2  . gabapentin (NEURONTIN) 300 MG capsule Take 1 capsule (300 mg total) by mouth 3 (three) times daily as needed. 90 capsule 2  . hydrochlorothiazide (HYDRODIURIL) 12.5 MG tablet Take 1 tablet (12.5 mg total) by mouth daily. 30 tablet 5  . losartan (COZAAR) 50 MG tablet Take 1 tablet (50 mg total) by mouth daily. 30 tablet 5  . naproxen (NAPROSYN) 500 MG tablet Take 1 tablet (500 mg total) by mouth 2 (two) times daily with a meal. 30 tablet 0  . omeprazole (PRILOSEC) 20 MG capsule Take 1 capsule (20 mg total) by mouth daily. 30 capsule 2  . vitamin B-12 (CYANOCOBALAMIN) 500 MCG tablet Take 1 tablet (500 mcg total) by mouth daily. 30 tablet 1   No facility-administered medications prior to visit.      ROS Review of Systems  Constitutional: Negative for chills, fever and malaise/fatigue.  Eyes: Negative  for blurred vision.  Respiratory: Negative for shortness of breath.   Cardiovascular: Negative for chest pain and palpitations.  Gastrointestinal: Negative for abdominal pain and nausea.  Genitourinary: Negative for dysuria and hematuria.  Musculoskeletal: Negative for joint pain and myalgias.  Skin: Negative for rash.  Neurological: Negative for tingling and headaches.  Psychiatric/Behavioral: Negative for depression. The patient is not nervous/anxious.     Objective:  BP 122/76 (BP Location: Left Arm, Patient Position: Sitting, Cuff Size: Normal)   Pulse 84   Temp 97.8 F (36.6 C) (Oral)   Ht 5\' 3"  (1.6 m)   Wt 142 lb (64.4 kg)   SpO2 93%   BMI 25.15 kg/m   BP/Weight 10/11/2017 09/13/2017 09/09/2017  Systolic BP 122 126 141  Diastolic BP 76 87 96  Wt. (Lbs) 142 144 152  BMI 25.15 24.72 27.8      Physical Exam  Constitutional: She is oriented to person, place, and time.  Well developed, well nourished, NAD, polite  HENT:  Head: Normocephalic and atraumatic.  Mouth/Throat: No oropharyngeal exudate.  Eyes: Conjunctivae and EOM are normal. Pupils are equal, round, and reactive to light. No scleral icterus.  Neck: Normal range of motion. Neck supple. No thyromegaly present.  Cardiovascular: Normal rate, regular rhythm and normal heart sounds. Exam reveals no gallop and no friction rub.  No murmur heard. Pulmonary/Chest: Effort normal and breath sounds normal. No respiratory distress. She has no wheezes. She has  no rales.  Abdominal: Soft. Bowel sounds are normal. She exhibits no distension and no mass. There is no tenderness.  Genitourinary:  Genitourinary Comments: Vagina with thick white discharge. Cervix surgically absent. No adnexal mass or tenderness bilaterally. No genitourinary symptoms.  Musculoskeletal: She exhibits no edema.  LEs, UEs, and back with full aROM. No pain elicited with motion.  Lymphadenopathy:    She has no cervical adenopathy.  Neurological: She  is alert and oriented to person, place, and time. No cranial nerve deficit. Coordination normal.  Skin: Skin is warm and dry. No rash noted. No erythema. No pallor.  Psychiatric: She has a normal mood and affect. Her behavior is normal. Thought content normal.  Vitals reviewed.    Assessment & Plan:   1. Annual physical exam - CBC with Differential - Comprehensive metabolic panel  2. Screening for HIV (human immunodeficiency virus) - HIV antibody  3. Encounter for hepatitis C screening test for low risk patient - Hepatitis c antibody (reflex)  4. Screening for breast cancer - MS DIGITAL SCREENING BILATERAL; Future  5. Screening for cervical cancer - Cytology - PAP [place]  6. Special screening for malignant neoplasms, colon - Fecal occult blood, imunochemical  7. Need for Tdap vaccination - Tdap vaccine greater than or equal to 7yo IM  8. Estrogen deficiency - DG Bone Density; Future  9. Vaginal yeast infection - Begin fluconazole (DIFLUCAN) 150 MG tablet; Take 1 tablet (150 mg total) by mouth once for 1 dose.  Dispense: 1 tablet; Refill: 0  10. Depression with anxiety - Refill escitalopram (LEXAPRO) 20 MG tablet; Take 1 tablet (20 mg total) by mouth daily.  Dispense: 30 tablet; Refill: 2   Meds ordered this encounter  Medications  . escitalopram (LEXAPRO) 20 MG tablet    Sig: Take 1 tablet (20 mg total) by mouth daily.    Dispense:  30 tablet    Refill:  2    Order Specific Question:   Supervising Provider    Answer:   Quentin AngstJEGEDE, OLUGBEMIGA E [id number]]  . fluconazole (DIFLUCAN) 150 MG tablet    Sig: Take 1 tablet (150 mg total) by mouth once for 1 dose.    Dispense:  1 tablet    Refill:  0    Order Specific Question:   Supervising Provider    Answer:   Quentin AngstJEGEDE, OLUGBEMIGA E [[id number]]    Follow-up: Return in about 3 months (around 01/09/2018) for depression with anxiety.   Loletta Specteroger David Willey Due PA

## 2017-10-11 NOTE — Patient Instructions (Signed)
Prueba de Papanicolaou  (Pap Test)  ¿POR QUÉ ME DEBO REALIZAR ESTA PRUEBA?  A esta prueba también se la denomina "frotis de Pap". Es una prueba de detección que se utiliza para detectar signos de cáncer de vagina, cuello del útero y útero. La prueba también puede identificar la presencia de infección o cambios precancerosos. El médico probablemente le recomiende que se realice esta prueba en forma regular. Esta prueba puede realizarse de la siguiente manera:  · Cada 3 años, a partir de los 21 años.  · Cada 5 años, en combinación con las pruebas que se realizan para detectar la presencia del virus del papiloma humano (VPH).  · Con mayor o menor frecuencia, en función de otras enfermedades que tenga.  ¿QUÉ TIPO DE MUESTRA SE TOMA?  El médico utilizará un pequeño hisopo de algodón, una espátula de plástico o un cepillo para recolectar una muestra de células de la superficie del cuello del útero. El cuello del útero es la apertura del útero, que también se conoce como matriz. También pueden recolectarse las secreciones del cuello del útero y la vagina.  ¿CÓMO DEBO PREPARARME PARA ESTA PRUEBA?  · Tenga en cuenta en qué etapa del ciclo menstrual se encuentra. Es posible que deba reprogramar la prueba si está menstruando el día en que debe realizársela.  · Si el día en que debe realizarse la prueba tiene una infección vaginal aparente, deberá reprogramar la prueba.  · Pueden pedirle que evite tomar una ducha o baño el día de la prueba o el día anterior.  · Algunos medicamentos pueden provocar resultados anormales de la prueba, como los digitálicos y la tetraciclina. Si toma alguno de estos medicamentos, hable con su médico antes de realizarse la prueba.  ¿QUÉ SIGNIFICAN LOS RESULTADOS?  Los resultados anormales de la prueba pueden indicar diversas enfermedades. Estas pueden incluir lo siguiente:  · Cáncer. Si bien los resultados de la prueba de Papanicolaou no pueden  utilizarse para diagnosticar cáncer de cuello del útero, de vagina o de útero, pueden indicar que existe una posibilidad de presencia de cáncer. En este caso, será necesario realizar pruebas adicionales para determinar la presencia de cáncer.  · Enfermedad de transmisión sexual.  · Infecciones por hongos.  · Infección por parásitos.  · Infección por herpes.  · Una enfermedad que causa o favorece la infertilidad.  Es su responsabilidad retirar el resultado del estudio. Consulte en el laboratorio o en el departamento en el que fue realizado el estudio cuándo y cómo podrá obtener los resultados. Comuníquese con el médico si tiene preguntas sobre los resultados.  Esta información no tiene como fin reemplazar el consejo del médico. Asegúrese de hacerle al médico cualquier pregunta que tenga.  Document Released: 03/19/2008 Document Revised: 10/22/2014 Document Reviewed: 02/22/2014  Elsevier Interactive Patient Education © 2018 Elsevier Inc.

## 2017-10-12 LAB — CBC WITH DIFFERENTIAL/PLATELET
Basophils Absolute: 0 10*3/uL (ref 0.0–0.2)
Basos: 0 %
EOS (ABSOLUTE): 0 10*3/uL (ref 0.0–0.4)
Eos: 0 %
Hematocrit: 39.6 % (ref 34.0–46.6)
Hemoglobin: 13.1 g/dL (ref 11.1–15.9)
Immature Grans (Abs): 0 10*3/uL (ref 0.0–0.1)
Immature Granulocytes: 0 %
Lymphocytes Absolute: 2.2 10*3/uL (ref 0.7–3.1)
Lymphs: 36 %
MCH: 30.5 pg (ref 26.6–33.0)
MCHC: 33.1 g/dL (ref 31.5–35.7)
MCV: 92 fL (ref 79–97)
Monocytes Absolute: 0.6 10*3/uL (ref 0.1–0.9)
Monocytes: 10 %
Neutrophils Absolute: 3.2 10*3/uL (ref 1.4–7.0)
Neutrophils: 54 %
Platelets: 306 10*3/uL (ref 150–379)
RBC: 4.3 x10E6/uL (ref 3.77–5.28)
RDW: 14.3 % (ref 12.3–15.4)
WBC: 6 10*3/uL (ref 3.4–10.8)

## 2017-10-12 LAB — COMPREHENSIVE METABOLIC PANEL
ALT: 18 IU/L (ref 0–32)
AST: 18 IU/L (ref 0–40)
Albumin/Globulin Ratio: 1.3 (ref 1.2–2.2)
Albumin: 4.3 g/dL (ref 3.6–4.8)
Alkaline Phosphatase: 118 IU/L — ABNORMAL HIGH (ref 39–117)
BUN/Creatinine Ratio: 30 — ABNORMAL HIGH (ref 12–28)
BUN: 26 mg/dL (ref 8–27)
Bilirubin Total: 0.3 mg/dL (ref 0.0–1.2)
CO2: 23 mmol/L (ref 20–29)
Calcium: 9.4 mg/dL (ref 8.7–10.3)
Chloride: 100 mmol/L (ref 96–106)
Creatinine, Ser: 0.86 mg/dL (ref 0.57–1.00)
GFR calc Af Amer: 83 mL/min/{1.73_m2} (ref >59)
GFR calc non Af Amer: 72 mL/min/{1.73_m2} (ref >59)
Globulin, Total: 3.3 g/dL (ref 1.5–4.5)
Glucose: 83 mg/dL (ref 65–99)
Potassium: 3.7 mmol/L (ref 3.5–5.2)
Sodium: 140 mmol/L (ref 134–144)
Total Protein: 7.6 g/dL (ref 6.0–8.5)

## 2017-10-12 LAB — HCV COMMENT:

## 2017-10-12 LAB — HIV ANTIBODY (ROUTINE TESTING W REFLEX): HIV Screen 4th Generation wRfx: NONREACTIVE

## 2017-10-12 LAB — HEPATITIS C ANTIBODY (REFLEX): HCV Ab: <0.1 s/co ratio (ref 0.0–0.9)

## 2017-10-15 LAB — FECAL OCCULT BLOOD, IMMUNOCHEMICAL: Fecal Occult Bld: NEGATIVE

## 2017-11-11 ENCOUNTER — Ambulatory Visit (INDEPENDENT_AMBULATORY_CARE_PROVIDER_SITE_OTHER): Payer: BLUE CROSS/BLUE SHIELD | Admitting: Physician Assistant

## 2017-11-13 ENCOUNTER — Telehealth (INDEPENDENT_AMBULATORY_CARE_PROVIDER_SITE_OTHER): Payer: Self-pay | Admitting: Physician Assistant

## 2017-11-13 NOTE — Telephone Encounter (Signed)
Patient was here on 12/18 and Amy Cline mention PA Lily KocherGomez that Amy Cline still have Naproxen from previous doctor and Amy Cline wants PA Rx Naproxen because her hip is hurting a lot due to the type of work Amy Cline does . [facility] use [facility] [address]  [employer] [place]

## 2017-11-15 ENCOUNTER — Other Ambulatory Visit (INDEPENDENT_AMBULATORY_CARE_PROVIDER_SITE_OTHER): Payer: Self-pay | Admitting: Physician Assistant

## 2017-11-15 DIAGNOSIS — G894 Chronic pain syndrome: Secondary | ICD-10-CM

## 2017-11-15 MED ORDER — NAPROXEN 500 MG PO TABS: 500.0000 mg | ORAL_TABLET | Freq: Two times a day (BID) | ORAL | 1 refills | Status: DC

## 2017-11-15 NOTE — Telephone Encounter (Signed)
Medical Assistant used [employer] to contact patient.  Interpreter Name: Renold DonLorenzo Interpreter #: [id number] Patient was not available, [employer] left patient a voicemail. Voicemail states to give a call back to [place] [facility] at [phone]. Patient is aware of [facility]. Patient advised of medication being used for a short period of time, so to not increase risk of worsening kidneys or heart.

## 2017-11-15 NOTE — Telephone Encounter (Signed)
[facility]. She should only take Naproxen short term as this class of medication can affect her kidneys and heart.

## 2017-11-21 ENCOUNTER — Other Ambulatory Visit (INDEPENDENT_AMBULATORY_CARE_PROVIDER_SITE_OTHER): Payer: Self-pay | Admitting: Physician Assistant

## 2017-11-21 DIAGNOSIS — E559 Vitamin D deficiency, unspecified: Secondary | ICD-10-CM

## 2017-11-21 DIAGNOSIS — R5383 Other fatigue: Secondary | ICD-10-CM

## 2017-12-04 ENCOUNTER — Other Ambulatory Visit (INDEPENDENT_AMBULATORY_CARE_PROVIDER_SITE_OTHER): Payer: Self-pay | Admitting: Physician Assistant

## 2017-12-04 DIAGNOSIS — G894 Chronic pain syndrome: Secondary | ICD-10-CM

## 2017-12-05 ENCOUNTER — Ambulatory Visit (INDEPENDENT_AMBULATORY_CARE_PROVIDER_SITE_OTHER): Payer: BLUE CROSS/BLUE SHIELD | Admitting: Physician Assistant

## 2017-12-05 ENCOUNTER — Other Ambulatory Visit (INDEPENDENT_AMBULATORY_CARE_PROVIDER_SITE_OTHER): Payer: Self-pay | Admitting: Physician Assistant

## 2017-12-05 DIAGNOSIS — G894 Chronic pain syndrome: Secondary | ICD-10-CM

## 2017-12-16 NOTE — Telephone Encounter (Signed)
FWD to PCP. Canio Winokur S Marley Charlot, CMA  

## 2017-12-16 NOTE — Telephone Encounter (Signed)
FWD to PCP. Amy Cline Amy Cline, CMA  

## 2017-12-17 NOTE — Telephone Encounter (Signed)
FWD to PCP. Kiaria Quinnell S Emireth Cockerham, CMA  

## 2017-12-17 NOTE — Telephone Encounter (Signed)
FWD to PCP. [name], CMA  

## 2018-01-09 ENCOUNTER — Ambulatory Visit (INDEPENDENT_AMBULATORY_CARE_PROVIDER_SITE_OTHER): Payer: BLUE CROSS/BLUE SHIELD | Admitting: Physician Assistant

## 2018-01-09 ENCOUNTER — Other Ambulatory Visit: Payer: Self-pay

## 2018-01-09 ENCOUNTER — Encounter (INDEPENDENT_AMBULATORY_CARE_PROVIDER_SITE_OTHER): Payer: Self-pay | Admitting: Physician Assistant

## 2018-01-09 VITALS — BP 147/101 | HR 77 | Temp 97.9°F | Ht 63.5 in | Wt 145.4 lb

## 2018-01-09 DIAGNOSIS — L750 Bromhidrosis: Secondary | ICD-10-CM

## 2018-01-09 DIAGNOSIS — N3000 Acute cystitis without hematuria: Secondary | ICD-10-CM | POA: Diagnosis not present

## 2018-01-09 DIAGNOSIS — R519 Headache, unspecified: Secondary | ICD-10-CM

## 2018-01-09 DIAGNOSIS — R51 Headache: Secondary | ICD-10-CM | POA: Diagnosis not present

## 2018-01-09 DIAGNOSIS — Z760 Encounter for issue of repeat prescription: Secondary | ICD-10-CM | POA: Diagnosis not present

## 2018-01-09 LAB — POCT URINALYSIS DIPSTICK
Bilirubin, UA: NEGATIVE
Glucose, UA: NEGATIVE
Ketones, UA: NEGATIVE
Nitrite, UA: POSITIVE
Protein, UA: NEGATIVE
Spec Grav, UA: 1.025 (ref 1.010–1.025)
Urobilinogen, UA: 0.2 E.U./dL
pH, UA: 7 (ref 5.0–8.0)

## 2018-01-09 MED ORDER — TRIAMCINOLONE ACETONIDE 0.1 % EX CREA: 1.0000 "application " | TOPICAL_CREAM | Freq: Two times a day (BID) | CUTANEOUS | 0 refills | Status: DC

## 2018-01-09 MED ORDER — CIPROFLOXACIN HCL 500 MG PO TABS: 500.0000 mg | ORAL_TABLET | Freq: Two times a day (BID) | ORAL | 0 refills | Status: AC

## 2018-01-09 MED ORDER — PREDNISONE 20 MG PO TABS: 60.0000 mg | ORAL_TABLET | Freq: Every day | ORAL | 0 refills | Status: DC

## 2018-01-09 NOTE — Progress Notes (Signed)
Subjective:  Patient ID: Amy Cline, female    DOB: 1955-10-21  Age: 62 y.o. MRN: [id number]  CC: f/u depression with anxiety, med refill, urinary odor  HPI Shanah Bruneris a 62 y.o.femalewith a medical history of bladder prolapse, herniation of rectum into vagina, breast mass, chronic headaches, CAD, fibromyalgia, GERD, HLD, HTN, insomnia, prediabetes, plantar fascitis, and vitamin D deficiency presents to f/u on depression with anxiety. Currently not taking Escitalopram 20 mg daily as directed. Says mood has improved due to social factors that have improved. She feels Gabapentin has been instrumental in improving sleep and pain which in turn have improved her mood.      Also complains of urinary odor that smells like "garlic and vinegar" since one month ago. Does not endorse urinary frequency, dysuria, hematuria, f/c/n/v, suprapubic pain, back pain, or flank pain.     Has a HA that occurs approximately twice per day on the right parietal/temple/periorbital region. No visual disturbances with headache. Pain last approximately 2-3 minutes and rated 8/10. There is also associated scalp tenderness with the headache. Does not endorse any other associated symptoms including focal neurological deficits, LOC, CP, palpitations, SOB, neck pain, or  lightheadedness.    Outpatient Medications Prior to Visit  Medication Sig Dispense Refill  . atorvastatin (LIPITOR) 20 MG tablet Take 1 tablet (20 mg total) by mouth daily. 90 tablet 3  . gabapentin (NEURONTIN) 300 MG capsule Take 1 capsule (300 mg total) by mouth 3 (three) times daily as needed. 90 capsule 2  . hydrochlorothiazide (HYDRODIURIL) 12.5 MG tablet Take 1 tablet (12.5 mg total) by mouth daily. 30 tablet 5  . losartan (COZAAR) 50 MG tablet Take 1 tablet (50 mg total) by mouth daily. 30 tablet 5  . omeprazole (PRILOSEC) 20 MG capsule TAKE 1 CAPSULE BY MOUTH EVERY DAY 30 capsule 2  . Cholecalciferol (VITAMIN D-3) 5000 units TABS Take 1 tablet  by mouth daily. (Patient not taking: Reported on 01/09/2018) 30 tablet 1  . clonazePAM (KLONOPIN) 0.5 MG tablet Take 1 tablet (0.5 mg total) by mouth at bedtime. (Patient not taking: Reported on 01/09/2018) 30 tablet 0  . [employer] B-12 500 MCG tablet TAKE 1 TABLET BY MOUTH EVERY DAY (Patient not taking: Reported on 01/09/2018) 100 tablet 0  . escitalopram (LEXAPRO) 20 MG tablet Take 1 tablet (20 mg total) by mouth daily. (Patient not taking: Reported on 01/09/2018) 30 tablet 2  . naproxen (NAPROSYN) 500 MG tablet Take 1 tablet (500 mg total) by mouth 2 (two) times daily with a meal. (Patient not taking: Reported on 01/09/2018) 14 tablet 1   No facility-administered medications prior to visit.      ROS Review of Systems  Constitutional: Negative for chills, fever and malaise/fatigue.  Eyes: Positive for blurred vision (chronic ).  Respiratory: Negative for shortness of breath.   Cardiovascular: Negative for chest pain and palpitations.  Gastrointestinal: Negative for abdominal pain and nausea.  Genitourinary: Negative for dysuria, flank pain, frequency, hematuria and urgency.  Musculoskeletal: Negative for joint pain, myalgias and neck pain.  Skin: Negative for rash.  Neurological: Positive for headaches. Negative for dizziness, tingling, tremors, sensory change, speech change, focal weakness, seizures, loss of consciousness and weakness.  Psychiatric/Behavioral: Negative for depression. The patient is not nervous/anxious.     Objective:  BP (!) 147/101 (BP Location: Right Arm, Patient Position: Sitting, Cuff Size: Normal)   Pulse 77   Temp 97.9 F (36.6 C) (Oral)   Ht 5' 3.5" (1.613 m)   Wt  145 lb 6.4 oz (66 kg)   SpO2 95%   BMI 25.35 kg/m   BP/Weight 01/09/2018 10/11/2017 09/13/2017  Systolic BP 147 122 126  Diastolic BP 101 76 87  Wt. (Lbs) 145.4 142 144  BMI 25.35 25.15 24.72      Physical Exam  Constitutional: She is oriented to person, place, and time.  Well developed, well  nourished, NAD, polite  HENT:  Head: Normocephalic and atraumatic.  No scalp tenderness on palpation  Eyes: Pupils are equal, round, and reactive to light. Conjunctivae and EOM are normal. No scleral icterus.  Neck: Normal range of motion. Neck supple. No thyromegaly present.  Cardiovascular: Normal rate, regular rhythm and normal heart sounds.  No carotid bruit bilaterally  Pulmonary/Chest: Effort normal and breath sounds normal. No respiratory distress. She has no wheezes.  Musculoskeletal: She exhibits no edema.  Lymphadenopathy:    She has no cervical adenopathy.  Neurological: She is alert and oriented to person, place, and time. No cranial nerve deficit. Coordination normal.  Skin: Skin is warm and dry. No rash noted. No erythema. No pallor.  Psychiatric: She has a normal mood and affect. Her behavior is normal. Thought content normal.  Vitals reviewed.    Assessment & Plan:    1. Acute cystitis without hematuria - ciprofloxacin (CIPRO) 500 MG tablet; Take 1 tablet (500 mg total) by mouth 2 (two) times daily for 5 days.  Dispense: 10 tablet; Refill: 0 - Urine Culture  2. Acute nonintractable headache, unspecified headache type - C-reactive protein - Sedimentation Rate - Comprehensive metabolic panel - CBC with Differential - Angiotensin converting enzyme - predniSONE (DELTASONE) 20 MG tablet; Take 3 tablets (60 mg total) by mouth daily with breakfast for 14 days.  Dispense: 42 tablet; Refill: 0  3. Scalp tenderness - Initial work up for giant cell arteritis. Will order biopsy if labs positive. Patient educated on the possibility of GCA and was strongly advised to return in exactly 7 days to discuss results and further treatment/evaluation if appropriate. Patient communicated understanding.  - C-reactive protein - Sedimentation Rate - Comprehensive metabolic panel - CBC with Differential - Angiotensin converting enzyme - Begin predniSONE (DELTASONE) 20 MG tablet; Take 3  tablets (60 mg total) by mouth daily with breakfast for 14 days.  Dispense: 42 tablet; Refill: 0  4. Medication refill - triamcinolone cream (KENALOG) 0.1 %; Apply 1 application topically 2 (two) times daily.  Dispense: 30 g; Refill: 0   Meds ordered this encounter  Medications  . predniSONE (DELTASONE) 20 MG tablet    Sig: Take 3 tablets (60 mg total) by mouth daily with breakfast for 14 days.    Dispense:  42 tablet    Refill:  0    Order Specific Question:   Supervising Provider    Answer:   Quentin AngstJEGEDE, OLUGBEMIGA E [id number]  . ciprofloxacin (CIPRO) 500 MG tablet    Sig: Take 1 tablet (500 mg total) by mouth 2 (two) times daily for 5 days.    Dispense:  10 tablet    Refill:  0    Order Specific Question:   Supervising Provider    Answer:   Quentin AngstJEGEDE, OLUGBEMIGA E [id number]  . triamcinolone cream (KENALOG) 0.1 %    Sig: Apply 1 application topically 2 (two) times daily.    Dispense:  30 g    Refill:  0    Order Specific Question:   Supervising Provider    Answer:   Quentin AngstJEGEDE, OLUGBEMIGA E [id number]  Follow-up: Return in about 1 week (around 01/16/2018) for Headache and scalp tenderness.   Loletta Specter PA

## 2018-01-09 NOTE — Patient Instructions (Signed)

## 2018-01-09 NOTE — Addendum Note (Signed)
Addended by: Maryjean MornOBERTS, TEMPESTT S on: 01/09/2018 10:50 AM   Modules accepted: Orders

## 2018-01-11 LAB — COMPREHENSIVE METABOLIC PANEL
ALT: 29 IU/L (ref 0–32)
AST: 35 IU/L (ref 0–40)
Albumin/Globulin Ratio: 1.1 — ABNORMAL LOW (ref 1.2–2.2)
Albumin: 4.5 g/dL (ref 3.6–4.8)
Alkaline Phosphatase: 134 IU/L — ABNORMAL HIGH (ref 39–117)
BUN/Creatinine Ratio: 24 (ref 12–28)
BUN: 19 mg/dL (ref 8–27)
Bilirubin Total: 0.2 mg/dL (ref 0.0–1.2)
CO2: 22 mmol/L (ref 20–29)
Calcium: 9.2 mg/dL (ref 8.7–10.3)
Chloride: 98 mmol/L (ref 96–106)
Creatinine, Ser: 0.78 mg/dL (ref 0.57–1.00)
GFR calc Af Amer: 94 mL/min/{1.73_m2} (ref >59)
GFR calc non Af Amer: 81 mL/min/{1.73_m2} (ref >59)
Globulin, Total: 4 g/dL (ref 1.5–4.5)
Glucose: 90 mg/dL (ref 65–99)
Potassium: 4.9 mmol/L (ref 3.5–5.2)
Sodium: 137 mmol/L (ref 134–144)
Total Protein: 8.5 g/dL (ref 6.0–8.5)

## 2018-01-11 LAB — CBC WITH DIFFERENTIAL/PLATELET
Basophils Absolute: 0 10*3/uL (ref 0.0–0.2)
Basos: 0 %
EOS (ABSOLUTE): 0 10*3/uL (ref 0.0–0.4)
Eos: 1 %
Hematocrit: 38.1 % (ref 34.0–46.6)
Hemoglobin: 12.9 g/dL (ref 11.1–15.9)
Immature Grans (Abs): 0 10*3/uL (ref 0.0–0.1)
Immature Granulocytes: 0 %
Lymphocytes Absolute: 1.6 10*3/uL (ref 0.7–3.1)
Lymphs: 28 %
MCH: 31 pg (ref 26.6–33.0)
MCHC: 33.9 g/dL (ref 31.5–35.7)
MCV: 92 fL (ref 79–97)
Monocytes Absolute: 0.5 10*3/uL (ref 0.1–0.9)
Monocytes: 9 %
Neutrophils Absolute: 3.5 10*3/uL (ref 1.4–7.0)
Neutrophils: 62 %
Platelets: 273 10*3/uL (ref 150–379)
RBC: 4.16 x10E6/uL (ref 3.77–5.28)
RDW: 14.1 % (ref 12.3–15.4)
WBC: 5.6 10*3/uL (ref 3.4–10.8)

## 2018-01-11 LAB — URINE CULTURE

## 2018-01-11 LAB — C-REACTIVE PROTEIN: CRP: 5.9 mg/L — ABNORMAL HIGH (ref 0.0–4.9)

## 2018-01-11 LAB — SEDIMENTATION RATE: Sed Rate: 44 mm/hr — ABNORMAL HIGH (ref 0–40)

## 2018-01-11 LAB — ANGIOTENSIN CONVERTING ENZYME: Angio Convert Enzyme: 67 U/L (ref 14–82)

## 2018-01-14 ENCOUNTER — Telehealth (INDEPENDENT_AMBULATORY_CARE_PROVIDER_SITE_OTHER): Payer: Self-pay

## 2018-01-14 NOTE — Telephone Encounter (Signed)
-----   Message from Loletta Specteroger David Gomez, PA-C sent at 01/13/2018  5:55 PM EDT ----- E coli susceptible to cipro. Should be feeling better now. Mildly increased inflammatory markers but unsure if this is from UTI. Will need to retest at her f/u this week.

## 2018-01-14 NOTE — Telephone Encounter (Signed)
Patients daughter states that patient is feeling much better. Patient is complaining of a problem with her ear, told daughter to tell patient address this issue with provider at office visit on 4/4. Maryjean Mornempestt S Roberts, CMA

## 2018-01-16 ENCOUNTER — Ambulatory Visit (INDEPENDENT_AMBULATORY_CARE_PROVIDER_SITE_OTHER): Payer: BLUE CROSS/BLUE SHIELD | Admitting: Physician Assistant

## 2018-01-16 ENCOUNTER — Other Ambulatory Visit: Payer: Self-pay

## 2018-01-16 ENCOUNTER — Encounter (INDEPENDENT_AMBULATORY_CARE_PROVIDER_SITE_OTHER): Payer: Self-pay | Admitting: Physician Assistant

## 2018-01-16 VITALS — BP 144/81 | HR 67 | Temp 98.0°F | Ht 63.5 in | Wt 147.8 lb

## 2018-01-16 DIAGNOSIS — N390 Urinary tract infection, site not specified: Secondary | ICD-10-CM | POA: Diagnosis not present

## 2018-01-16 DIAGNOSIS — F411 Generalized anxiety disorder: Secondary | ICD-10-CM

## 2018-01-16 DIAGNOSIS — R51 Headache: Secondary | ICD-10-CM

## 2018-01-16 DIAGNOSIS — R519 Headache, unspecified: Secondary | ICD-10-CM

## 2018-01-16 LAB — POCT URINALYSIS DIPSTICK
Bilirubin, UA: NEGATIVE
Glucose, UA: NEGATIVE
Ketones, UA: NEGATIVE
Leukocytes, UA: NEGATIVE
Nitrite, UA: NEGATIVE
Protein, UA: NEGATIVE
Spec Grav, UA: 1.025 (ref 1.010–1.025)
Urobilinogen, UA: 0.2 E.U./dL
pH, UA: 6 (ref 5.0–8.0)

## 2018-01-16 MED ORDER — ESCITALOPRAM OXALATE 20 MG PO TABS: 20.0000 mg | ORAL_TABLET | Freq: Every day | ORAL | 2 refills | Status: DC

## 2018-01-16 MED ORDER — CLONAZEPAM 0.5 MG PO TABS: 0.5000 mg | ORAL_TABLET | Freq: Two times a day (BID) | ORAL | 1 refills | Status: DC | PRN

## 2018-01-16 MED ORDER — CLONAZEPAM 0.5 MG PO TABS: 0.5000 mg | ORAL_TABLET | Freq: Two times a day (BID) | ORAL | 0 refills | Status: DC | PRN

## 2018-01-16 MED ORDER — TOPIRAMATE 25 MG PO TABS: 25.0000 mg | ORAL_TABLET | Freq: Every day | ORAL | 2 refills | Status: DC

## 2018-01-16 NOTE — Progress Notes (Signed)
Pt complains of shoulder pain feels  Pt complains that when she gets a headache she has trouble seeing out of her right eye Pt complains of feeling nervous/anxious

## 2018-01-16 NOTE — Progress Notes (Signed)
Subjective:  Patient ID: Amy Cline, female    DOB: [birth year]  Age: 62 y.o. MRN: [id number]  CC: Headache   HPI Amy Cline a 62 y.o.femalewith a medical history of bladder prolapse, herniation of rectum into vagina, breast mass, chronic headaches, CAD, fibromyalgia, GERD, HLD, HTN, insomnia, prediabetes, plantar fascitis, and vitamin D deficiency presentsto f/u on headache with scalp tenderness and acute cystitis. CRP and ESR slightly elevated at 5.9 mg/L and 44 mm/hr respectively. E coli found on urine culture. It is unclear if acute cystitis may have contributed to elevated ESR and CRP. Patient returns for repeat testing. Currently taking Prednisone 60 mg daily.    In regards to anxiety, patient not taking her escitalopram or clonazepam at all. Says she has too many pills to take and is scared there may be consequences of taking too many pills.    Currently has bilateral upper trapezius pain since last night and is described as severe. Lost sleep due to pain. No headache but says she feels some right eye discomfort and a sensation of "having a watery eye without actually having a watery eye".        Outpatient Medications Prior to Visit  Medication Sig Dispense Refill  . atorvastatin (LIPITOR) 20 MG tablet Take 1 tablet (20 mg total) by mouth daily. 90 tablet 3  . gabapentin (NEURONTIN) 300 MG capsule Take 1 capsule (300 mg total) by mouth 3 (three) times daily as needed. 90 capsule 2  . hydrochlorothiazide (HYDRODIURIL) 12.5 MG tablet Take 1 tablet (12.5 mg total) by mouth daily. 30 tablet 5  . losartan (COZAAR) 50 MG tablet Take 1 tablet (50 mg total) by mouth daily. 30 tablet 5  . omeprazole (PRILOSEC) 20 MG capsule TAKE 1 CAPSULE BY MOUTH EVERY DAY 30 capsule 2  . predniSONE (DELTASONE) 20 MG tablet Take 3 tablets (60 mg total) by mouth daily with breakfast for 14 days. 42 tablet 0  . triamcinolone cream (KENALOG) 0.1 % Apply 1 application topically 2 (two) times daily. 30  g 0   No facility-administered medications prior to visit.      ROS Review of Systems  Constitutional: Negative for chills, fever and malaise/fatigue.  Eyes: Negative for blurred vision.  Respiratory: Negative for shortness of breath.   Cardiovascular: Negative for chest pain and palpitations.  Gastrointestinal: Negative for abdominal pain and nausea.  Genitourinary: Negative for dysuria and hematuria.  Musculoskeletal: Negative for joint pain and myalgias.  Skin: Negative for rash.  Neurological: Negative for tingling and headaches.  Psychiatric/Behavioral: Negative for depression. The patient is not nervous/anxious.     Objective:  BP (!) 144/81 (BP Location: Left Arm, Patient Position: Sitting, Cuff Size: Normal)   Pulse 67   Temp 98 F (36.7 C) (Oral)   Ht 5' 3.5" (1.613 m)   Wt 147 lb 12.8 oz (67 kg)   SpO2 94%   BMI 25.77 kg/m   BP/Weight 01/16/2018 01/09/2018 79/15/0569  Systolic BP 794 801 655  Diastolic BP 81 374 76  Wt. (Lbs) 147.8 145.4 142  BMI 25.77 25.35 25.15      Physical Exam  Constitutional: She is oriented to person, place, and time.  Well developed, well nourished, NAD, polite  HENT:  Head: Normocephalic and atraumatic.  Eyes: No scleral icterus.  Neck: Normal range of motion. Neck supple. No thyromegaly present.  Cardiovascular: Normal rate, regular rhythm and normal heart sounds.  Pulmonary/Chest: Effort normal and breath sounds normal. No respiratory distress.  Musculoskeletal: She exhibits  no edema.  Neurological: She is alert and oriented to person, place, and time.  Skin: Skin is warm and dry. No rash noted. No erythema. No pallor.  Psychiatric: Her behavior is normal. Thought content normal.  Somewhat anxious  Vitals reviewed.    Assessment & Plan:    1. Nonintractable headache, unspecified chronicity pattern, unspecified headache type - Sedimentation Rate; Future - C-reactive protein; Future - Begin topiramate (TOPAMAX) 25 MG  tablet; Take 1 tablet (25 mg total) by mouth daily.  Dispense: 30 tablet; Refill: 2  2. Urinary tract infection without hematuria, site unspecified - Urinalysis Dipstick negative. UTI resolved.   3. Generalized anxiety disorder - Resume escitalopram (LEXAPRO) 20 MG tablet; Take 1 tablet (20 mg total) by mouth daily.  Dispense: 30 tablet; Refill: 2 - Resume clonazePAM (KLONOPIN) 0.5 MG tablet; Take 1 tablet (0.5 mg total) by mouth 2 (two) times daily as needed for anxiety.  Dispense: 30 tablet; Refill: 0   Meds ordered this encounter  Medications  . escitalopram (LEXAPRO) 20 MG tablet    Sig: Take 1 tablet (20 mg total) by mouth daily.    Dispense:  30 tablet    Refill:  2    Order Specific Question:   Supervising Provider    Answer:   Tresa Garter [id number]  . DISCONTD: clonazePAM (KLONOPIN) 0.5 MG tablet    Sig: Take 1 tablet (0.5 mg total) by mouth 2 (two) times daily as needed for anxiety.    Dispense:  20 tablet    Refill:  1    Order Specific Question:   Supervising Provider    Answer:   Tresa Garter [id number]  . topiramate (TOPAMAX) 25 MG tablet    Sig: Take 1 tablet (25 mg total) by mouth daily.    Dispense:  30 tablet    Refill:  2    Order Specific Question:   Supervising Provider    Answer:   Tresa Garter [id number]  . clonazePAM (KLONOPIN) 0.5 MG tablet    Sig: Take 1 tablet (0.5 mg total) by mouth 2 (two) times daily as needed for anxiety.    Dispense:  30 tablet    Refill:  0    Order Specific Question:   Supervising Provider    Answer:   Tresa Garter [id number]    Follow-up: Return in about 2 weeks (around 01/30/2018) for headache.   Clent Demark PA

## 2018-01-16 NOTE — Patient Instructions (Signed)
Trastorno de ansiedad generalizada, en adultos  Generalized Anxiety Disorder, Adult  El trastorno de ansiedad generalizada (TAG) es un trastorno de salud mental. Las personas con esta afeccin se preocupan constantemente por los eventos de todos los das. A diferencia de la ansiedad normal, la preocupacin relacionada con el TAG no se produce por un evento especfico. Estas preocupaciones tampoco desaparecen ni mejoran con el tiempo. EL TAG interfiere con las funciones de la vida, incluidas las relaciones, el trabajo y la escuela.  El TAG puede variar de leve a grave. Las personas con TAG grave tienen intensas oleadas de ansiedad con sntomas fsicos (crisis de angustia).  Cules son las causas?  Se desconoce la causa exacta del TAG.  Qu incrementa el riesgo?  Es ms probable que esta afeccin se manifieste en:   Mujeres.   Las personas que tienen antecedentes familiares de trastornos de ansiedad.   Las personas que son muy tmidas.   Las personas que experimentan eventos muy estresantes en la vida, tales como la muerte de un ser querido.   Las personas que tienen un entorno familiar muy estresante.    Cules son los signos o los sntomas?  Con frecuencia, las personas que padecen el TAG se preocupan excesivamente por muchas cosas en la vida, tales como su salud y su familia. Tambin pueden experimentar una preocupacin desmedida por lo siguiente:   Hacer las cosas bien.   Llegar a tiempo.   Los desastres naturales.   Las amistades.    Los sntomas fsicos del TAG incluyen:   Fatiga.   Tensin muscular o contracciones musculares.   Temblores o agitacin.   Sobresaltarse con facilidad.   Sentir que el corazn late fuerte o est acelerado.   Sentir falta de aire o como que no se puede respirar profundamente.   Problemas para quedarse dormido o para seguir durmiendo.   Sudoracin.   Nuseas, diarrea o sndrome del intestino irritable (SII).   Dolores de cabeza.   Dificultad para concentrarse o  recordar hechos.   Agitacin.   Irritabilidad.    Cmo se diagnostica?  El mdico puede diagnosticar el TAG en funcin de los sntomas y los antecedentes mdicos. Se le realizar un examen fsico. El mdico le har preguntas especficas sobre sus sntomas, que incluyen qu tan graves son, cundo comenzaron y si van y vienen. Tambin puede hacerle preguntas sobre el consumo de alcohol o drogas, incluidos los medicamentos recetados. Su mdico podr derivarlo a un especialista de salud mental para ms evaluaciones.  Su mdico le realizar un examen exhaustivo y puede hacer pruebas adicionales para descartar otras posibles causas de sus sntomas.  Para recibir un diagnstico del TAG, una persona debe tener ansiedad que:   Est fuera de su control.   Afecte distintos aspectos de su vida, como el trabajo y las relaciones.   Cause angustia que le impida participar en sus actividades habituales.   Incluya al menos tres sntomas fsicos del TAG tales como inquietud, fatiga, dificultad para concentrarse, irritabilidad, tensin muscular o problemas para dormir.    Antes de que su mdico pueda confirmar un diagnstico del TAG, estos sntomas deben estar presentes ms das de los que no lo estn y deben tener una duracin de seis meses o ms.  Cmo se trata?  Las siguientes terapias se usan generalmente para tratar el TAG:   Medicamentos. Por lo general, los medicamentos antidepresivos se recetan para un control diario a largo plazo. Se pueden agregar medicamentos para la ansiedad en casos   graves, especialmente cuando ocurren crisis de angustia.   Psicoterapia (psicoanlisis). Determinados tipos de psicoterapia pueden ser tiles para tratar el TAG al brindar apoyo, educacin y orientacin. Entre las opciones se incluyen las siguientes:  ? Terapia cognitivo conductual (TCC). Las personas aprenden habilidades para sobrellevar situaciones y tcnicas para aliviar la ansiedad. Aprenden a identificar conductas y pensamientos  irreales o negativos, y a reemplazarlos por positivos.  ? Terapia de aceptacin y compromiso (acceptance and commitment therapy, ACT). Este tratamiento ensea a las personas a ser conscientes como una forma de lidiar con pensamientos y sentimientos no deseados.  ? Biorretroalimentacin. Este proceso lo capacita para controlar la respuesta del cuerpo (respuesta psicolgica) a travs de tcnicas de respiracin y mtodos de relajacin. Usted trabajar con un terapeuta mientras se usan mquinas para controlar sus sntomas fsicos.   Tcnicas para controlar el estrs. Estas incluyen yoga, meditacin y ejercicio.    Un especialista en salud mental puede ayudar a determinar qu tratamiento es el mejor para usted. Algunas personas pueden mejorar con solo un tipo de terapia. Sin embargo, otras personas requieren una combinacin de terapias.  Siga estas instrucciones en su casa:   Tome los medicamentos de venta libre y los recetados solamente como se lo haya indicado el mdico.   Trate de mantener una rutina normal.   Trate de anticipar situaciones estresantes y permita un tiempo adicional para controlarlas.   Participe de cualquier tcnica para autocalmarse o controlar el estrs segn las indicaciones de su mdico.   No se castigue ante los retrocesos o por no realizar progresos.   Trate de reconocer sus logros aunque sean pequeos.   Concurra a todas las visitas de seguimiento como se lo haya indicado el mdico. Esto es importante.  Comunquese con un mdico si:   Los sntomas no mejoran.   Los sntomas empeoran.   Tiene signos de depresin tales como:  ? Tristeza, mal humor o irritabilidad.  ? Ya no disfruta de actividades que le solan causar placer.  ? Cambios en el peso y o en sus hbitos de alimentacin.  ? Cambios en los hbitos de sueo.  ? Evita a amigos y familiares.  ? No tiene energa para realizar las tareas habituales.  ? Tiene sentimientos de culpa o de minusvala.  Solicite ayuda de inmediato  si:   Tiene pensamientos serios acerca de lastimarse a usted mismo o daar a otras personas.  Si alguna vez siente que puede lastimarse o lastimar a los dems, o piensa en poner fin a su vida, busque ayuda de inmediato. Puede dirigirse al servicio de urgencias ms cercano o comunicarse con:   El servicio de emergencias de su localidad (911 en los [place]).   Una lnea de asistencia al suicida y atencin en crisis, como la Lnea Nacional de Prevencin del Suicidio (National Suicide Prevention Lifeline) al 1-800-273-8255. Est disponible las 24 horas del da.    Resumen   El trastorno de ansiedad generalizada (TAG) es un trastorno de salud mental que implica preocupacin no causada por un evento especfico.   Con frecuencia, las personas que padecen el TAG se preocupan excesivamente por muchas cosas en la vida, tales como su salud y su familia.   El TAG puede causar sntomas fsicos tales como inquietud, dificultad para concentrarse, problemas para dormir, sudoracin frecuente, nuseas, diarrea, dolores de cabeza y temblores, o contracciones musculares.   Un especialista en salud mental puede ayudar a determinar qu tratamiento es el mejor para usted. Algunas personas pueden mejorar   con solo un tipo de terapia. Sin embargo, otras personas requieren una combinacin de terapias.  Esta informacin no tiene como fin reemplazar el consejo del mdico. Asegrese de hacerle al mdico cualquier pregunta que tenga.  Document Released: 01/26/2013 Document Revised: 01/04/2017 Document Reviewed: 01/04/2017  Elsevier Interactive Patient Education  2018 [employer]

## 2018-01-20 ENCOUNTER — Telehealth (INDEPENDENT_AMBULATORY_CARE_PROVIDER_SITE_OTHER): Payer: Self-pay | Admitting: Physician Assistant

## 2018-01-20 NOTE — Telephone Encounter (Signed)
FWD to PCP. Amy Cline  

## 2018-01-20 NOTE — Telephone Encounter (Signed)
She would be fine to take OTC "Vit B Complex". Thank you.

## 2018-01-20 NOTE — Telephone Encounter (Signed)
Patient's Daughter call because she wants to know  About B12 Vitamin that she is suppose to take 500 -5000mg  . Please, call her ar  [phone] Thank you

## 2018-01-21 NOTE — Telephone Encounter (Signed)
I don't know why this OTC recommendation is taking so much time. I want the pt to pick up the OTC product that says "B complex". These are at the recommended daily doses already.

## 2018-01-21 NOTE — Telephone Encounter (Signed)
What milligram?

## 2018-01-21 NOTE — Telephone Encounter (Signed)
Done I spoke with the daughter to info the respond

## 2018-01-21 NOTE — Telephone Encounter (Signed)
Please tell patient or her daughter to purchase an OTC that says " B complex" it is already at the recommended dose per PCP. Maryjean Mornempestt S [place], CMA

## 2018-01-23 ENCOUNTER — Other Ambulatory Visit (INDEPENDENT_AMBULATORY_CARE_PROVIDER_SITE_OTHER): Payer: BLUE CROSS/BLUE SHIELD

## 2018-01-23 DIAGNOSIS — R519 Headache, unspecified: Secondary | ICD-10-CM

## 2018-01-23 DIAGNOSIS — R51 Headache: Principal | ICD-10-CM

## 2018-01-24 ENCOUNTER — Telehealth (INDEPENDENT_AMBULATORY_CARE_PROVIDER_SITE_OTHER): Payer: Self-pay

## 2018-01-24 ENCOUNTER — Other Ambulatory Visit (INDEPENDENT_AMBULATORY_CARE_PROVIDER_SITE_OTHER): Payer: BLUE CROSS/BLUE SHIELD

## 2018-01-24 ENCOUNTER — Other Ambulatory Visit (INDEPENDENT_AMBULATORY_CARE_PROVIDER_SITE_OTHER): Payer: Self-pay | Admitting: Physician Assistant

## 2018-01-24 DIAGNOSIS — R51 Headache: Principal | ICD-10-CM

## 2018-01-24 DIAGNOSIS — R519 Headache, unspecified: Secondary | ICD-10-CM

## 2018-01-24 LAB — SEDIMENTATION RATE: Sed Rate: 36 mm/hr (ref 0–40)

## 2018-01-24 LAB — C-REACTIVE PROTEIN: CRP: 1.8 mg/L (ref 0.0–4.9)

## 2018-01-24 MED ORDER — PREDNISONE 10 MG PO TABS: 10.0000 mg | ORAL_TABLET | Freq: Every day | ORAL | 0 refills | Status: DC

## 2018-01-24 NOTE — Telephone Encounter (Addendum)
Patient aware that signs of mild inflammation are back to normal range likely due to the prednisone use. There is a new Rx for taper down prednisone for her to pick up from our clinic and take to the pharmacy. She will begin taking new Rx and stop taking the prednisone she's on now. Patient will try to pick up Monday. Josecarlos Harriott Budd PalmerS Iverson Sees, CMA   Called patient using angelica [phone] with [employer] interpreters.

## 2018-01-24 NOTE — Telephone Encounter (Signed)
-----   Message from Loletta Specteroger David Gomez, PA-C sent at 01/24/2018  1:26 PM EDT ----- Mild signs of inflammation are back to normal range. This may be likely due to prednisone use. I am printing a down taper rx for prednisone for her to take. May stop taking prednisone she has now and start the new prednisone down taper dosing.

## 2018-01-29 ENCOUNTER — Ambulatory Visit
Admission: RE | Admit: 2018-01-29 | Discharge: 2018-01-29 | Disposition: A | Discharge status: Home / Self Care | Payer: BLUE CROSS/BLUE SHIELD | Source: Ambulatory Visit | Attending: Nurse Practitioner | Admitting: Nurse Practitioner

## 2018-01-29 ENCOUNTER — Other Ambulatory Visit (INDEPENDENT_AMBULATORY_CARE_PROVIDER_SITE_OTHER): Payer: Self-pay | Admitting: Physician Assistant

## 2018-01-29 ENCOUNTER — Ambulatory Visit
Admission: RE | Admit: 2018-01-29 | Discharge: 2018-01-29 | Disposition: A | Discharge status: Home / Self Care | Payer: BLUE CROSS/BLUE SHIELD | Source: Ambulatory Visit | Attending: Physician Assistant | Admitting: Physician Assistant

## 2018-01-29 DIAGNOSIS — R928 Other abnormal and inconclusive findings on diagnostic imaging of breast: Secondary | ICD-10-CM

## 2018-01-29 DIAGNOSIS — Z1239 Encounter for other screening for malignant neoplasm of breast: Secondary | ICD-10-CM

## 2018-01-29 DIAGNOSIS — E2839 Other primary ovarian failure: Secondary | ICD-10-CM

## 2018-01-30 ENCOUNTER — Other Ambulatory Visit: Payer: Self-pay | Admitting: Nurse Practitioner

## 2018-01-30 ENCOUNTER — Other Ambulatory Visit: Payer: Self-pay

## 2018-01-30 ENCOUNTER — Encounter: Payer: Self-pay | Admitting: Gastroenterology

## 2018-01-30 ENCOUNTER — Ambulatory Visit (INDEPENDENT_AMBULATORY_CARE_PROVIDER_SITE_OTHER): Payer: BLUE CROSS/BLUE SHIELD | Admitting: Nurse Practitioner

## 2018-01-30 ENCOUNTER — Telehealth (INDEPENDENT_AMBULATORY_CARE_PROVIDER_SITE_OTHER): Payer: Self-pay | Admitting: Physician Assistant

## 2018-01-30 ENCOUNTER — Encounter (INDEPENDENT_AMBULATORY_CARE_PROVIDER_SITE_OTHER): Payer: Self-pay | Admitting: Nurse Practitioner

## 2018-01-30 VITALS — BP 123/80 | HR 75 | Temp 98.0°F | Wt 146.0 lb

## 2018-01-30 DIAGNOSIS — Z1211 Encounter for screening for malignant neoplasm of colon: Secondary | ICD-10-CM | POA: Diagnosis not present

## 2018-01-30 DIAGNOSIS — G44229 Chronic tension-type headache, not intractable: Secondary | ICD-10-CM

## 2018-01-30 DIAGNOSIS — I10 Essential (primary) hypertension: Secondary | ICD-10-CM

## 2018-01-30 MED ORDER — LOSARTAN POTASSIUM 50 MG PO TABS: 50.0000 mg | ORAL_TABLET | Freq: Every day | ORAL | 5 refills | Status: DC

## 2018-01-30 MED ORDER — HYDROCHLOROTHIAZIDE 12.5 MG PO TABS: 12.5000 mg | ORAL_TABLET | Freq: Every day | ORAL | 5 refills | Status: DC

## 2018-01-30 NOTE — Patient Instructions (Signed)
Health Maintenance for Postmenopausal Women Menopause is a normal process in which your reproductive ability comes to an end. This process happens gradually over a span of months to years, usually between the ages of 22 and 9. Menopause is complete when you have missed 12 consecutive menstrual periods. It is important to talk with your health care provider about some of the most common conditions that affect postmenopausal women, such as heart disease, cancer, and bone loss (osteoporosis). Adopting a healthy lifestyle and getting preventive care can help to promote your health and wellness. Those actions can also lower your chances of developing some of these common conditions. What should I know about menopause? During menopause, you may experience a number of symptoms, such as:  Moderate-to-severe hot flashes.  Night sweats.  Decrease in sex drive.  Mood swings.  Headaches.  Tiredness.  Irritability.  Memory problems.  Insomnia.  Choosing to treat or not to treat menopausal changes is an individual decision that you make with your health care provider. What should I know about hormone replacement therapy and supplements? Hormone therapy products are effective for treating symptoms that are associated with menopause, such as hot flashes and night sweats. Hormone replacement carries certain risks, especially as you become older. If you are thinking about using estrogen or estrogen with progestin treatments, discuss the benefits and risks with your health care provider. What should I know about heart disease and stroke? Heart disease, heart attack, and stroke become more likely as you age. This may be due, in part, to the hormonal changes that your body experiences during menopause. These can affect how your body processes dietary fats, triglycerides, and cholesterol. Heart attack and stroke are both medical emergencies. There are many things that you can do to help prevent heart disease  and stroke:  Have your blood pressure checked at least every 1-2 years. High blood pressure causes heart disease and increases the risk of stroke.  If you are 53-22 years old, ask your health care provider if you should take aspirin to prevent a heart attack or a stroke.  Do not use any tobacco products, including cigarettes, chewing tobacco, or electronic cigarettes. If you need help quitting, ask your health care provider.  It is important to eat a healthy diet and maintain a healthy weight. ? Be sure to include plenty of vegetables, fruits, low-fat dairy products, and lean protein. ? Avoid eating foods that are high in solid fats, added sugars, or salt (sodium).  Get regular exercise. This is one of the most important things that you can do for your health. ? Try to exercise for at least 150 minutes each week. The type of exercise that you do should increase your heart rate and make you sweat. This is known as moderate-intensity exercise. ? Try to do strengthening exercises at least twice each week. Do these in addition to the moderate-intensity exercise.  Know your numbers.Ask your health care provider to check your cholesterol and your blood glucose. Continue to have your blood tested as directed by your health care provider.  What should I know about cancer screening? There are several types of cancer. Take the following steps to reduce your risk and to catch any cancer development as early as possible. Breast Cancer  Practice breast self-awareness. ? This means understanding how your breasts normally appear and feel. ? It also means doing regular breast self-exams. Let your health care provider know about any changes, no matter how small.  If you are 40  or older, have a clinician do a breast exam (clinical breast exam or CBE) every year. Depending on your age, family history, and medical history, it may be recommended that you also have a yearly breast X-ray (mammogram).  If you  have a family history of breast cancer, talk with your health care provider about genetic screening.  If you are at high risk for breast cancer, talk with your health care provider about having an MRI and a mammogram every year.  Breast cancer (BRCA) gene test is recommended for women who have family members with BRCA-related cancers. Results of the assessment will determine the need for genetic counseling and BRCA1 and for BRCA2 testing. BRCA-related cancers include these types: ? Breast. This occurs in males or females. ? Ovarian. ? Tubal. This may also be called fallopian tube cancer. ? Cancer of the abdominal or pelvic lining (peritoneal cancer). ? Prostate. ? Pancreatic.  Cervical, Uterine, and Ovarian Cancer Your health care provider may recommend that you be screened regularly for cancer of the pelvic organs. These include your ovaries, uterus, and vagina. This screening involves a pelvic exam, which includes checking for microscopic changes to the surface of your cervix (Pap test).  For women ages 21-65, health care providers may recommend a pelvic exam and a Pap test every three years. For women ages 79-65, they may recommend the Pap test and pelvic exam, combined with testing for human papilloma virus (HPV), every five years. Some types of HPV increase your risk of cervical cancer. Testing for HPV may also be done on women of any age who have unclear Pap test results.  Other health care providers may not recommend any screening for nonpregnant women who are considered low risk for pelvic cancer and have no symptoms. Ask your health care provider if a screening pelvic exam is right for you.  If you have had past treatment for cervical cancer or a condition that could lead to cancer, you need Pap tests and screening for cancer for at least 20 years after your treatment. If Pap tests have been discontinued for you, your risk factors (such as having a new sexual partner) need to be  reassessed to determine if you should start having screenings again. Some women have medical problems that increase the chance of getting cervical cancer. In these cases, your health care provider may recommend that you have screening and Pap tests more often.  If you have a family history of uterine cancer or ovarian cancer, talk with your health care provider about genetic screening.  If you have vaginal bleeding after reaching menopause, tell your health care provider.  There are currently no reliable tests available to screen for ovarian cancer.  Lung Cancer Lung cancer screening is recommended for adults 69-62 years old who are at high risk for lung cancer because of a history of smoking. A yearly low-dose CT scan of the lungs is recommended if you:  Currently smoke.  Have a history of at least 30 pack-years of smoking and you currently smoke or have quit within the past 15 years. A pack-year is smoking an average of one pack of cigarettes per day for one year.  Yearly screening should:  Continue until it has been 15 years since you quit.  Stop if you develop a health problem that would prevent you from having lung cancer treatment.  Colorectal Cancer  This type of cancer can be detected and can often be prevented.  Routine colorectal cancer screening usually begins at  age 42 and continues through age 45.  If you have risk factors for colon cancer, your health care provider may recommend that you be screened at an earlier age.  If you have a family history of colorectal cancer, talk with your health care provider about genetic screening.  Your health care provider may also recommend using home test kits to check for hidden blood in your stool.  A small camera at the end of a tube can be used to examine your colon directly (sigmoidoscopy or colonoscopy). This is done to check for the earliest forms of colorectal cancer.  Direct examination of the colon should be repeated every  5-10 years until age 71. However, if early forms of precancerous polyps or small growths are found or if you have a family history or genetic risk for colorectal cancer, you may need to be screened more often.  Skin Cancer  Check your skin from head to toe regularly.  Monitor any moles. Be sure to tell your health care provider: ? About any new moles or changes in moles, especially if there is a change in a mole's shape or color. ? If you have a mole that is larger than the size of a pencil eraser.  If any of your family members has a history of skin cancer, especially at a young age, talk with your health care provider about genetic screening.  Always use sunscreen. Apply sunscreen liberally and repeatedly throughout the day.  Whenever you are outside, protect yourself by wearing long sleeves, pants, a wide-brimmed hat, and sunglasses.  What should I know about osteoporosis? Osteoporosis is a condition in which bone destruction happens more quickly than new bone creation. After menopause, you may be at an increased risk for osteoporosis. To help prevent osteoporosis or the bone fractures that can happen because of osteoporosis, the following is recommended:  If you are 46-71 years old, get at least 1,000 mg of calcium and at least 600 mg of vitamin D per day.  If you are older than age 55 but younger than age 65, get at least 1,200 mg of calcium and at least 600 mg of vitamin D per day.  If you are older than age 54, get at least 1,200 mg of calcium and at least 800 mg of vitamin D per day.  Smoking and excessive alcohol intake increase the risk of osteoporosis. Eat foods that are rich in calcium and vitamin D, and do weight-bearing exercises several times each week as directed by your health care provider. What should I know about how menopause affects my mental health? Depression may occur at any age, but it is more common as you become older. Common symptoms of depression  include:  Low or sad mood.  Changes in sleep patterns.  Changes in appetite or eating patterns.  Feeling an overall lack of motivation or enjoyment of activities that you previously enjoyed.  Frequent crying spells.  Talk with your health care provider if you think that you are experiencing depression. What should I know about immunizations? It is important that you get and maintain your immunizations. These include:  Tetanus, diphtheria, and pertussis (Tdap) booster vaccine.  Influenza every year before the flu season begins.  Pneumonia vaccine.  Shingles vaccine.  Your health care provider may also recommend other immunizations. This information is not intended to replace advice given to you by your health care provider. Make sure you discuss any questions you have with your health care provider. Document Released: 11/23/2005  Document Revised: 04/20/2016 Document Reviewed: 07/05/2015 Elsevier Interactive Patient Education  2018 [employer] Inc[employer]  

## 2018-01-30 NOTE — Progress Notes (Signed)
Follow up HA Pain in upper back 7/10   Amy Cline [phone]

## 2018-01-30 NOTE — Telephone Encounter (Signed)
Pt called to request a refill for  hydrochlorothiazide (HYDRODIURIL) 12.5 MG tablet  losartan (COZAAR) 50 MG tablet  Please sent it to [id number] - [place], [place] - [address] Please follow up

## 2018-01-30 NOTE — Progress Notes (Signed)
Assessment & Plan:  Amy Cline was seen today for follow-up.  Diagnoses and all orders for this visit:  Chronic tension-type headache, not intractable Continue Topamax as prescribed Eliminate caffeine from your diet NO smoking  Colon cancer screening -     Ambulatory referral to Gastroenterology    Patient has been counseled on age-appropriate routine health concerns for screening and prevention. These are reviewed and up-to-date. Referrals have been placed accordingly. Immunizations are up-to-date or declined.    Subjective:   Chief Complaint  Patient presents with  . Follow-up    headache   HPI Amy Cline 62 y.o. female presents to office today for follow up of her headaches.    Headaches She was prescribed Topamax at her last office appointment for chronic non intractable headaches. Today she reports significant improvement in the frequency and intensity of her headaches. She would like to continue on her current dosage of 25mg  daily. She denies any nausea, vomiting or visual disturbances.   Review of Systems  Constitutional: Negative for fever, malaise/fatigue and weight loss.  HENT: Negative.  Negative for nosebleeds.   Eyes: Negative.  Negative for blurred vision, double vision and photophobia.  Respiratory: Negative.  Negative for cough and shortness of breath.   Cardiovascular: Negative.  Negative for chest pain, palpitations and leg swelling.  Gastrointestinal: Negative.  Negative for heartburn, nausea and vomiting.  Musculoskeletal: Negative.  Negative for myalgias.  Neurological: Positive for headaches (improved). Negative for dizziness, focal weakness and seizures.  Psychiatric/Behavioral: Negative.  Negative for suicidal ideas.    Past Medical History:  Diagnosis Date  . Bladder prolapse, female, acquired   . Breast mass   . Chronic headaches   . Coronary artery disease   . Fibromyalgia   . GERD (gastroesophageal reflux disease)   . Hyperlipemia   .  Hypertension   . Insomnia   . Prediabetes   . Vitamin D deficiency     Past Surgical History:  Procedure Laterality Date  . ABDOMINAL HYSTERECTOMY  2008  . APPENDECTOMY    . BREAST BIOPSY      Family History  Problem Relation Age of Onset  . Migraines Neg Hx     Social History Reviewed with no changes to be made today.   Outpatient Medications Prior to Visit  Medication Sig Dispense Refill  . atorvastatin (LIPITOR) 20 MG tablet Take 1 tablet (20 mg total) by mouth daily. 90 tablet 3  . clonazePAM (KLONOPIN) 0.5 MG tablet Take 1 tablet (0.5 mg total) by mouth 2 (two) times daily as needed for anxiety. 30 tablet 0  . escitalopram (LEXAPRO) 20 MG tablet Take 1 tablet (20 mg total) by mouth daily. 30 tablet 2  . gabapentin (NEURONTIN) 300 MG capsule Take 1 capsule (300 mg total) by mouth 3 (three) times daily as needed. 90 capsule 2  . hydrochlorothiazide (HYDRODIURIL) 12.5 MG tablet Take 1 tablet (12.5 mg total) by mouth daily. 30 tablet 5  . losartan (COZAAR) 50 MG tablet Take 1 tablet (50 mg total) by mouth daily. 30 tablet 5  . omeprazole (PRILOSEC) 20 MG capsule TAKE 1 CAPSULE BY MOUTH EVERY DAY 30 capsule 2  . triamcinolone cream (KENALOG) 0.1 % Apply 1 application topically 2 (two) times daily. 30 g 0  . predniSONE (DELTASONE) 10 MG tablet Take 1 tablet (10 mg total) by mouth daily with breakfast for 12 days. Day 1 take 6 tablets  Day 2 take 6 tablets Day 3 take 5 tablets  Day 4  take 5 tablets   Day 5 take 4 tablets  Day 6 take 4 tablets  Day 7 take 3 tablets   Day 8 take 3 tablets  Day 9 take 2 tablets Day 10 take 2 tablets  Day 11 take 1 tablet   Day 12 take 1 tablet 42 tablet 0  . topiramate (TOPAMAX) 25 MG tablet Take 1 tablet (25 mg total) by mouth daily. (Patient not taking: Reported on 01/30/2018) 30 tablet 2   No facility-administered medications prior to visit.     Allergies  Allergen Reactions  . Other     Per pt she does not have any allergies: NKA        Objective:    BP 123/80   Pulse 75   Temp 98 F (36.7 C)   Wt 146 lb (66.2 kg)   SpO2 98%   BMI 25.46 kg/m  Wt Readings from Last 3 Encounters:  01/30/18 146 lb (66.2 kg)  01/16/18 147 lb 12.8 oz (67 kg)  01/09/18 145 lb 6.4 oz (66 kg)    Physical Exam  Constitutional: She is oriented to person, place, and time. She appears well-developed and well-nourished. She is cooperative.  HENT:  Head: Normocephalic and atraumatic.  Eyes: EOM are normal.  Neck: Normal range of motion.  Cardiovascular: Normal rate, regular rhythm and normal heart sounds. Exam reveals no gallop and no friction rub.  No murmur heard. Pulmonary/Chest: Effort normal and breath sounds normal. No tachypnea. No respiratory distress. She has no decreased breath sounds. She has no wheezes. She has no rhonchi. She has no rales. She exhibits no tenderness.  Abdominal: Soft. Bowel sounds are normal.  Musculoskeletal: Normal range of motion. She exhibits no edema.  Neurological: She is alert and oriented to person, place, and time. Coordination normal.  Skin: Skin is warm and dry.  Psychiatric: She has a normal mood and affect. Her behavior is normal. Judgment and thought content normal.  Nursing note and vitals reviewed.        Patient has been counseled extensively about nutrition and exercise as well as the importance of adherence with medications and regular follow-up. The patient was given clear instructions to go to ER or return to medical center if symptoms don't improve, worsen or new problems develop. The patient verbalized understanding.   Follow-up: Return if symptoms worsen or fail to improve.   Claiborne RiggZelda W Mariame Rybolt, FNP-BC [facility] [place] , [place] [phone]   01/30/2018, 8:54 AM

## 2018-01-30 NOTE — Telephone Encounter (Signed)
Refill sent.

## 2018-02-03 ENCOUNTER — Other Ambulatory Visit (INDEPENDENT_AMBULATORY_CARE_PROVIDER_SITE_OTHER): Payer: Self-pay | Admitting: Physician Assistant

## 2018-02-03 MED ORDER — ALENDRONATE SODIUM 35 MG PO TABS: 35.0000 mg | ORAL_TABLET | ORAL | 11 refills | Status: DC

## 2018-02-07 ENCOUNTER — Telehealth (INDEPENDENT_AMBULATORY_CARE_PROVIDER_SITE_OTHER): Payer: Self-pay

## 2018-02-07 NOTE — Telephone Encounter (Signed)
Patient aware of osteopenia and [facility] take 1 tablet every 7 days. Patient informed by bilingual front office associate Orpha BurYahir and CMA. Maryjean Mornempestt S Roberts, CMA

## 2018-02-07 NOTE — Telephone Encounter (Signed)
-----   Message from Loletta Specteroger David Gomez, PA-C sent at 02/03/2018  2:16 PM EDT ----- Osteopenia according to bone scan. I will send alendronate to [facility] [address]. Take one tablet every 7 days.

## 2018-02-28 ENCOUNTER — Ambulatory Visit (AMBULATORY_SURGERY_CENTER): Payer: Self-pay

## 2018-02-28 VITALS — Ht 63.0 in | Wt 147.2 lb

## 2018-02-28 DIAGNOSIS — Z1211 Encounter for screening for malignant neoplasm of colon: Secondary | ICD-10-CM

## 2018-02-28 MED ORDER — NA SULFATE-K SULFATE-MG SULF 17.5-3.13-1.6 GM/177ML PO SOLN: 1.0000 | Freq: Once | ORAL | 0 refills | Status: AC

## 2018-02-28 NOTE — Progress Notes (Signed)
Sonia (interpretor) was with the pt during the PV and helped with medical hx, explained the prep and answered questions!  Per interpretor, pt has no allergies to soy or egg products.Pt is not taking any weight loss meds or using  O2 at home.  Pt refused emmi video.

## 2018-03-04 ENCOUNTER — Other Ambulatory Visit (INDEPENDENT_AMBULATORY_CARE_PROVIDER_SITE_OTHER): Payer: Self-pay | Admitting: Physician Assistant

## 2018-03-04 DIAGNOSIS — G894 Chronic pain syndrome: Secondary | ICD-10-CM

## 2018-03-14 ENCOUNTER — Ambulatory Visit (AMBULATORY_SURGERY_CENTER): Payer: BLUE CROSS/BLUE SHIELD | Admitting: Gastroenterology

## 2018-03-14 ENCOUNTER — Encounter: Payer: Self-pay | Admitting: Gastroenterology

## 2018-03-14 ENCOUNTER — Other Ambulatory Visit: Payer: Self-pay

## 2018-03-14 VITALS — BP 109/74 | HR 78 | Temp 98.4°F | Resp 12 | Ht 63.0 in | Wt 147.0 lb

## 2018-03-14 DIAGNOSIS — Z1211 Encounter for screening for malignant neoplasm of colon: Secondary | ICD-10-CM | POA: Diagnosis present

## 2018-03-14 MED ORDER — SODIUM CHLORIDE 0.9 % IV SOLN: 500.0000 mL | Freq: Once | INTRAVENOUS | Status: AC

## 2018-03-14 NOTE — Op Note (Signed)
[facility] Patient Name: Amy EdelsonLucina Cline Procedure Date: 03/14/2018 2:29 PM MRN: [id number] Endoscopist: Meryl DareMalcolm T Lorette Peterkin , MD Age: 62 Referring MD:  Date of Birth: Jul 18, 1956 Gender: Female Account #: [id number] Procedure:                Colonoscopy Indications:              Screening for colorectal malignant neoplasm Medicines:                Monitored Anesthesia Care Procedure:                Pre-Anesthesia Assessment:                           - Prior to the procedure, a History and Physical                            was performed, and patient medications and                            allergies were reviewed. The patient's tolerance of                            previous anesthesia was also reviewed. The risks                            and benefits of the procedure and the sedation                            options and risks were discussed with the patient.                            All questions were answered, and informed consent                            was obtained. Prior Anticoagulants: The patient has                            taken no previous anticoagulant or antiplatelet                            agents. ASA Grade Assessment: II - A patient with                            mild systemic disease. After reviewing the risks                            and benefits, the patient was deemed in                            satisfactory condition to undergo the procedure.                           After obtaining informed consent, the colonoscope  was passed under direct vision. Throughout the                            procedure, the patient's blood pressure, pulse, and                            oxygen saturations were monitored continuously. The                            Model [id number]-[id number]) scope was introduced                            through the anus and advanced to the the terminal                            ileum, with  identification of the appendiceal                            orifice and IC valve. The ileocecal valve,                            appendiceal orifice, and rectum were photographed.                            The quality of the bowel preparation was excellent.                            The colonoscopy was performed without difficulty.                            The patient tolerated the procedure well. Scope In: 2:38:55 PM Scope Out: 2:56:20 PM Scope Withdrawal Time: 0 hours 13 minutes 10 seconds  Total Procedure Duration: 0 hours 17 minutes 25 seconds  Findings:                 The perianal and digital rectal examinations were                            normal.                           The entire examined colon appeared normal on direct                            and retroflexion views. Complications:            No immediate complications. Estimated blood loss:                            None. Estimated Blood Loss:     Estimated blood loss: none. Impression:               - The entire examined colon is normal on direct and                            retroflexion views.                           -  No specimens collected. Recommendation:           - Repeat colonoscopy in 10 years for screening                            purposes.                           - Patient has a contact number available for                            emergencies. The signs and symptoms of potential                            delayed complications were discussed with the                            patient. Return to normal activities tomorrow.                            Written discharge instructions were provided to the                            patient.                           - Resume previous diet.                           - Continue present medications. Meryl Dare, MD 03/14/2018 2:59:24 PM This report has been signed electronically.

## 2018-03-14 NOTE — Patient Instructions (Signed)
Impression/Recommendations:  Repeat colonoscopy in 10 years for screening.  Resume previous diet. Continue present medications.  YOU HAD AN ENDOSCOPIC PROCEDURE TODAY AT THE [facility]:   Refer to the procedure report that was given to you for any specific questions about what was found during the examination.  If the procedure report does not answer your questions, please call your gastroenterologist to clarify.  If you requested that your care partner not be given the details of your procedure findings, then the procedure report has been included in a sealed envelope for you to review at your convenience later.  YOU SHOULD EXPECT: Some feelings of bloating in the abdomen. Passage of more gas than usual.  Walking can help get rid of the air that was put into your GI tract during the procedure and reduce the bloating. If you had a lower endoscopy (such as a colonoscopy or flexible sigmoidoscopy) you may notice spotting of blood in your stool or on the toilet paper. If you underwent a bowel prep for your procedure, you may not have a normal bowel movement for a few days.  Please Note:  You might notice some irritation and congestion in your nose or some drainage.  This is from the oxygen used during your procedure.  There is no need for concern and it should clear up in a day or so.  SYMPTOMS TO REPORT IMMEDIATELY:   Following lower endoscopy (colonoscopy or flexible sigmoidoscopy):  Excessive amounts of blood in the stool  Significant tenderness or worsening of abdominal pains  Swelling of the abdomen that is new, acute  Fever of 100F or higher For urgent or emergent issues, a gastroenterologist can be reached at any hour by calling [phone].   DIET:  We do recommend a small meal at first, but then you may proceed to your regular diet.  Drink plenty of fluids but you should avoid alcoholic beverages for 24 hours.  ACTIVITY:  You should plan to take it easy for the rest  of today and you should NOT DRIVE or use heavy machinery until tomorrow (because of the sedation medicines used during the test).    FOLLOW UP: Our staff will call the number listed on your records the next business day following your procedure to check on you and address any questions or concerns that you may have regarding the information given to you following your procedure. If we do not reach you, we will leave a message.  However, if you are feeling well and you are not experiencing any problems, there is no need to return our call.  We will assume that you have returned to your regular daily activities without incident.  If any biopsies were taken you will be contacted by phone or by letter within the next 1-3 weeks.  Please call us at [phone] if you have not heard about the biopsies in 3 weeks.    SIGNATURES/CONFIDENTIALITY: You and/or your care partner have signed paperwork which will be entered into your electronic medical record.  These signatures attest to the fact that that the information above on your After Visit Summary has been reviewed and is understood.  Full responsibility of the confidentiality of this discharge information lies with you and/or your care-partner. 

## 2018-03-14 NOTE — Progress Notes (Signed)
A and O x3. Report to RN. Tolerated MAC anesthesia well.

## 2018-03-17 ENCOUNTER — Telehealth: Payer: Self-pay | Admitting: *Deleted

## 2018-03-17 NOTE — Telephone Encounter (Signed)
  Follow up Call-  Call back number 03/14/2018  Post procedure Call Back phone  # [phone]  Permission to leave phone message Yes  Some recent data might be hidden     Patient questions:  Do you have a fever, pain , or abdominal swelling? No. Pain Score  0 *  Have you tolerated food without any problems? Yes.    Have you been able to return to your normal activities? Yes.    Do you have any questions about your discharge instructions: Diet   No. Medications  No. Follow up visit  No.  Do you have questions or concerns about your Care? No.  Actions: * If pain score is 4 or above: No action needed, pain <4.

## 2018-04-07 ENCOUNTER — Other Ambulatory Visit (INDEPENDENT_AMBULATORY_CARE_PROVIDER_SITE_OTHER): Payer: Self-pay | Admitting: Physician Assistant

## 2018-04-07 DIAGNOSIS — G894 Chronic pain syndrome: Secondary | ICD-10-CM

## 2018-04-07 DIAGNOSIS — R51 Headache: Secondary | ICD-10-CM

## 2018-04-07 DIAGNOSIS — R519 Headache, unspecified: Secondary | ICD-10-CM

## 2018-04-07 NOTE — Telephone Encounter (Signed)
FWD to PCP. [name], CMA  

## 2018-04-08 ENCOUNTER — Other Ambulatory Visit (INDEPENDENT_AMBULATORY_CARE_PROVIDER_SITE_OTHER): Payer: Self-pay | Admitting: Physician Assistant

## 2018-04-08 ENCOUNTER — Ambulatory Visit (INDEPENDENT_AMBULATORY_CARE_PROVIDER_SITE_OTHER): Payer: BLUE CROSS/BLUE SHIELD | Admitting: Physician Assistant

## 2018-04-08 DIAGNOSIS — M797 Fibromyalgia: Secondary | ICD-10-CM

## 2018-04-08 DIAGNOSIS — G894 Chronic pain syndrome: Secondary | ICD-10-CM

## 2018-04-08 MED ORDER — GABAPENTIN 300 MG PO CAPS: 300.0000 mg | ORAL_CAPSULE | Freq: Three times a day (TID) | ORAL | 3 refills | Status: DC | PRN

## 2018-04-08 NOTE — Telephone Encounter (Signed)
Request for 90 day supply, fwd to pcp. Maryjean Mornempestt S Roberts, CMA

## 2018-05-02 ENCOUNTER — Other Ambulatory Visit (INDEPENDENT_AMBULATORY_CARE_PROVIDER_SITE_OTHER): Payer: Self-pay | Admitting: Physician Assistant

## 2018-05-02 DIAGNOSIS — G894 Chronic pain syndrome: Secondary | ICD-10-CM

## 2018-05-02 NOTE — Telephone Encounter (Signed)
FWD to PCP. [name], CMA  

## 2018-05-28 ENCOUNTER — Encounter (INDEPENDENT_AMBULATORY_CARE_PROVIDER_SITE_OTHER): Payer: Self-pay

## 2018-05-28 ENCOUNTER — Ambulatory Visit (INDEPENDENT_AMBULATORY_CARE_PROVIDER_SITE_OTHER): Payer: Self-pay | Admitting: Physician Assistant

## 2018-05-28 DIAGNOSIS — Z5321 Procedure and treatment not carried out due to patient leaving prior to being seen by health care provider: Secondary | ICD-10-CM

## 2018-05-28 NOTE — Progress Notes (Signed)
Pt left without being seen due to lack of insurance coverage. Did not want to be billed.

## 2018-05-29 ENCOUNTER — Ambulatory Visit (INDEPENDENT_AMBULATORY_CARE_PROVIDER_SITE_OTHER): Payer: Self-pay | Admitting: Physician Assistant

## 2018-06-03 ENCOUNTER — Other Ambulatory Visit (INDEPENDENT_AMBULATORY_CARE_PROVIDER_SITE_OTHER): Payer: Self-pay | Admitting: Physician Assistant

## 2018-06-03 DIAGNOSIS — G894 Chronic pain syndrome: Secondary | ICD-10-CM

## 2018-06-03 NOTE — Telephone Encounter (Signed)
FWD to PCP. Amy Cline, CMA  

## 2018-07-02 ENCOUNTER — Other Ambulatory Visit (INDEPENDENT_AMBULATORY_CARE_PROVIDER_SITE_OTHER): Payer: Self-pay | Admitting: Physician Assistant

## 2018-07-02 DIAGNOSIS — G894 Chronic pain syndrome: Secondary | ICD-10-CM

## 2018-07-02 NOTE — Telephone Encounter (Signed)
FWD to PCP. [name], CMA  

## 2018-07-03 ENCOUNTER — Other Ambulatory Visit (INDEPENDENT_AMBULATORY_CARE_PROVIDER_SITE_OTHER): Payer: Self-pay | Admitting: Physician Assistant

## 2018-07-03 DIAGNOSIS — G894 Chronic pain syndrome: Secondary | ICD-10-CM

## 2018-07-03 NOTE — Telephone Encounter (Signed)
FWD to PCP. Alvaro Aungst S Belvie Iribe, CMA  

## 2018-07-15 HISTORY — PX: COLONOSCOPY: SHX174

## 2018-08-01 ENCOUNTER — Other Ambulatory Visit (INDEPENDENT_AMBULATORY_CARE_PROVIDER_SITE_OTHER): Payer: Self-pay | Admitting: Physician Assistant

## 2018-08-01 DIAGNOSIS — G894 Chronic pain syndrome: Secondary | ICD-10-CM

## 2018-08-01 MED ORDER — OMEPRAZOLE 20 MG PO CPDR: DELAYED_RELEASE_CAPSULE | ORAL | 1 refills | Status: DC

## 2018-08-11 ENCOUNTER — Encounter (INDEPENDENT_AMBULATORY_CARE_PROVIDER_SITE_OTHER): Payer: Self-pay | Admitting: Physician Assistant

## 2018-08-11 ENCOUNTER — Ambulatory Visit (INDEPENDENT_AMBULATORY_CARE_PROVIDER_SITE_OTHER): Payer: BLUE CROSS/BLUE SHIELD | Admitting: Physician Assistant

## 2018-08-11 VITALS — BP 119/76 | HR 79 | Temp 98.1°F | Resp 18 | Ht 65.0 in | Wt 146.0 lb

## 2018-08-11 DIAGNOSIS — M797 Fibromyalgia: Secondary | ICD-10-CM

## 2018-08-11 DIAGNOSIS — N816 Rectocele: Secondary | ICD-10-CM | POA: Insufficient documentation

## 2018-08-11 DIAGNOSIS — N814 Uterovaginal prolapse, unspecified: Secondary | ICD-10-CM

## 2018-08-11 DIAGNOSIS — R519 Headache, unspecified: Secondary | ICD-10-CM | POA: Insufficient documentation

## 2018-08-11 DIAGNOSIS — R51 Headache: Secondary | ICD-10-CM | POA: Diagnosis not present

## 2018-08-11 MED ORDER — TOPIRAMATE 25 MG PO TABS: 50.0000 mg | ORAL_TABLET | Freq: Every day | ORAL | 5 refills | Status: DC

## 2018-08-11 MED ORDER — DULOXETINE HCL 60 MG PO CPEP: 60.0000 mg | ORAL_CAPSULE | Freq: Every day | ORAL | 5 refills | Status: DC

## 2018-08-11 NOTE — Patient Instructions (Signed)
Dolor de cabeza general sin causa (General Headache Without Cause) El dolor de cabeza es un dolor o malestar que se siente en la zona de la cabeza o del cuello. Hay muchas causas y tipos de dolores de cabeza. En algunos casos, es posible que no se encuentre la causa. CUIDADOS EN EL HOGAR Control del dolor  Tome los medicamentos de venta libre y los recetados solamente como se lo haya indicado el mdico.  Cuando sienta dolor de cabeza acustese en un cuarto oscuro y tranquilo.  Si se lo indican, aplique hielo sobre la cabeza y la zona del cuello: ? Ponga el hielo en una bolsa plstica. ? Coloque una toalla entre la piel y la bolsa de hielo. ? Coloque el hielo durante 20minutos, 2 a 3veces por da.  Utilice una almohadilla trmica o tome una ducha con agua caliente para aplicar calor en la cabeza y la zona del cuello como se lo haya indicado el mdico.  Mantenga las luces tenues si le molesta las luces brillantes o sus dolores de cabeza empeoran. Comida y bebida  Mantenga un horario para las comidas.  Beba menos alcohol.  Consuma menos o deje de tomar cafena. Instrucciones generales  Concurra a todas las visitas de control como se lo haya indicado el mdico. Esto es importante.  Lleve un registro diario para averiguar si ciertas cosas provocan los dolores de cabeza. Por ejemplo, escriba los siguientes datos: ? Lo que usted come y bebe. ? Cunto tiempo duerme. ? Algn cambio en su dieta o en los medicamentos.  Realice actividades relajantes, como recibir masajes.  Disminuya el nivel de estrs.  Sintese con la espalda recta. No contraiga (tensione) los msculos.  No consuma productos que contengan tabaco. Estos incluyen cigarrillos, tabaco para mascar y cigarrillos electrnicos. Si necesita ayuda para dejar de fumar, consulte al mdico.  Haga ejercicios con regularidad tal como se lo indic el mdico.  Duerma lo suficiente. Esto a menudo significa entre 7 y 9horas de  sueo. SOLICITE AYUDA SI:  Los medicamentos no logran aliviar los sntomas.  Tiene un dolor de cabeza que es diferente a los otros dolores de cabeza.  Tiene malestar estomacal (nuseas) o vomita.  Tiene fiebre. SOLICITE AYUDA DE INMEDIATO SI:  El dolor de cabeza empeora.  Sigue vomitando.  Presenta rigidez en el cuello.  Tiene dificultad para ver.  Tiene dificultad para hablar.  Siente dolor en el ojo o en el odo.  Sus msculos estn dbiles, o pierde el control muscular.  Pierde el equilibrio o tiene problemas para caminar.  Siente que se desvanece (pierde el conocimiento) o se desmaya.  Se siente confundido. Esta informacin no tiene como fin reemplazar el consejo del mdico. Asegrese de hacerle al mdico cualquier pregunta que tenga. Document Released: 12/24/2011 Document Revised: 06/22/2015 Document Reviewed: 01/24/2015 Elsevier Interactive Patient Education  2018 Elsevier Inc.  

## 2018-08-11 NOTE — Progress Notes (Signed)
Subjective:  Patient ID: Amy Cline, female    DOB: [birth year]  Age: 62 y.o. MRN: [id number]  CC: back pain and headache  HPI Amy Cline a 62 y.o.femalewith a medical history of bladder prolapse, herniation of rectum into vagina, breast mass, chronic headaches, CAD, fibromyalgia, GERD, HLD, HTN, insomnia, prediabetes, plantar fascitis, and vitamin D deficiency presentswith headache and chronic pain syndrome. Headache associated with scalp tenderness at the crown of head. Has body pain in the pattern typical for fibromyalgia with the exception of the medial knees. Pt feeling depressed and anxious. PHQ9 15 and GAD7 11. Stopped taking escitalopram on her own accord. Does not sleep well. Initiates sleep but can not maintain uninterrupted sleep. Does not endorse any other symptoms or complaints.      Outpatient Medications Prior to Visit  Medication Sig Dispense Refill  . alendronate (FOSAMAX) 35 MG tablet Take 1 tablet (35 mg total) by mouth every 7 (seven) days. Take with a full glass of water on an empty stomach. 4 tablet 11  . atorvastatin (LIPITOR) 20 MG tablet Take 1 tablet (20 mg total) by mouth daily. 90 tablet 3  . carboxymethylcellulose (REFRESH PLUS) 0.5 % SOLN 1 drop as needed.    . Cholecalciferol (VITAMIN D-3) 5000 units TABS Take by mouth daily at 6 (six) AM.    . gabapentin (NEURONTIN) 300 MG capsule Take 1 capsule (300 mg total) by mouth 3 (three) times daily as needed. 270 capsule 3  . hydrochlorothiazide (HYDRODIURIL) 12.5 MG tablet Take 1 tablet (12.5 mg total) by mouth daily. 30 tablet 5  . losartan (COZAAR) 50 MG tablet Take 1 tablet (50 mg total) by mouth daily. 30 tablet 5  . omeprazole (PRILOSEC) 20 MG capsule TAKE 1 CAPSULE BY MOUTH EVERY DAY 90 capsule 1  . topiramate (TOPAMAX) 25 MG tablet TAKE 1 TABLET BY MOUTH EVERY DAY 30 tablet 2  . triamcinolone cream (KENALOG) 0.1 % Apply 1 application topically 2 (two) times daily. 30 g 0  . B Complex-C (SUPER B  COMPLEX PO) Take by mouth daily at 6 (six) AM.    . clonazePAM (KLONOPIN) 0.5 MG tablet Take 1 tablet (0.5 mg total) by mouth 2 (two) times daily as needed for anxiety. (Patient not taking: Reported on 02/28/2018) 30 tablet 0  . escitalopram (LEXAPRO) 20 MG tablet Take 1 tablet (20 mg total) by mouth daily. (Patient not taking: Reported on 02/28/2018) 30 tablet 2   Facility-Administered Medications Prior to Visit  Medication Dose Route Frequency Provider Last Rate Last Dose  . 0.9 %  sodium chloride infusion  500 mL Intravenous Once Meryl Dare, MD         ROS Review of Systems  Constitutional: Positive for malaise/fatigue. Negative for chills and fever.  Eyes: Negative for blurred vision.  Respiratory: Negative for shortness of breath.   Cardiovascular: Negative for chest pain and palpitations.  Gastrointestinal: Negative for abdominal pain and nausea.  Genitourinary: Negative for dysuria and hematuria.  Musculoskeletal: Positive for joint pain and myalgias.  Skin: Negative for rash.  Neurological: Positive for headaches. Negative for tingling.  Psychiatric/Behavioral: Positive for depression. The patient is nervous/anxious and has insomnia.     Objective:  BP 119/76 (BP Location: Left Arm, Patient Position: Sitting, Cuff Size: Normal)   Pulse 79   Temp 98.1 F (36.7 C) (Oral)   Resp 18   Ht 5\' 5"  (1.651 m)   Wt 146 lb (66.2 kg)   SpO2 97%   BMI 24.30  kg/m   BP/Weight 08/11/2018 03/14/2018 02/28/2018  Systolic BP 119 109 -  Diastolic BP 76 74 -  Wt. (Lbs) 146 147 147.2  BMI 24.3 26.04 26.08      Physical Exam  Constitutional: She is oriented to person, place, and time.  Well developed, well nourished, NAD, polite  HENT:  Head: Normocephalic and atraumatic.  Eyes: EOM are normal. No scleral icterus.  Neck: Normal range of motion. Neck supple. No thyromegaly present.  Cardiovascular: Normal rate, regular rhythm and normal heart sounds.  Pulmonary/Chest: Effort  normal and breath sounds normal.  Musculoskeletal: She exhibits no edema.  Neurological: She is alert and oriented to person, place, and time. She has normal strength. She is not disoriented. No cranial nerve deficit or sensory deficit. Coordination and gait normal.  Skin: Skin is warm and dry. No rash noted. No erythema. No pallor.  Psychiatric: She has a normal mood and affect. Her behavior is normal. Thought content normal.  Vitals reviewed.    Assessment & Plan:   1. Fibromyalgia - Begin DULoxetine (CYMBALTA) 60 MG capsule; Take 1 capsule (60 mg total) by mouth daily.  Dispense: 30 capsule; Refill: 5  2. Cystocele with prolapse - Ambulatory referral to Gynecology  3. Nonintractable headache, unspecified chronicity pattern, unspecified headache type - topiramate (TOPAMAX) 25 MG tablet; Take 2 tablets (50 mg total) by mouth daily.  Dispense: 60 tablet; Refill: 5 -- Suspect may likely be related to poor sleep, depression, anxiety, and fibromyalgia.  4. Scalp tenderness - Ambulatory referral to Vascular Surgery   Meds ordered this encounter  Medications  . DULoxetine (CYMBALTA) 60 MG capsule    Sig: Take 1 capsule (60 mg total) by mouth daily.    Dispense:  30 capsule    Refill:  5    Order Specific Question:   Supervising Provider    Answer:   Hoy Register [[id number]]  . topiramate (TOPAMAX) 25 MG tablet    Sig: Take 2 tablets (50 mg total) by mouth daily.    Dispense:  60 tablet    Refill:  5    Order Specific Question:   Supervising Provider    Answer:   Hoy Register [[id number]]    Follow-up: Return in about 6 weeks (around 09/22/2018) for headache and depression.   Loletta Specter PA

## 2018-08-12 ENCOUNTER — Other Ambulatory Visit: Payer: Self-pay

## 2018-08-18 ENCOUNTER — Telehealth (INDEPENDENT_AMBULATORY_CARE_PROVIDER_SITE_OTHER): Payer: Self-pay | Admitting: Physician Assistant

## 2018-08-18 ENCOUNTER — Other Ambulatory Visit (INDEPENDENT_AMBULATORY_CARE_PROVIDER_SITE_OTHER): Payer: Self-pay | Admitting: Physician Assistant

## 2018-08-18 DIAGNOSIS — I10 Essential (primary) hypertension: Secondary | ICD-10-CM

## 2018-08-18 MED ORDER — LOSARTAN POTASSIUM 50 MG PO TABS: 50.0000 mg | ORAL_TABLET | Freq: Every day | ORAL | 5 refills | Status: DC

## 2018-08-18 MED ORDER — HYDROCHLOROTHIAZIDE 12.5 MG PO TABS: 12.5000 mg | ORAL_TABLET | Freq: Every day | ORAL | 5 refills | Status: DC

## 2018-08-18 NOTE — Telephone Encounter (Signed)
Left voice mail notifying patient that Rx [facility] to [facility] and should be able to pick.  Thank you Louisa Second

## 2018-08-18 NOTE — Telephone Encounter (Signed)
Patient called requesting medication refill for    hydrochlorothiazide (HYDRODIURIL) 12.5 MG   losartan (COZAAR) 50 MG tablet  States that PCP was suppose to refill on her last ov. Patient states that today makes 3 days that she has not taken this medication due to running out.  [id number] [place], [place] - [address]  Please advice [phone]  Thank you Louisa Second

## 2018-08-18 NOTE — Telephone Encounter (Signed)
FWD to PCP. [name], CMA  

## 2018-08-18 NOTE — Telephone Encounter (Signed)
[facility]

## 2018-09-03 ENCOUNTER — Other Ambulatory Visit (INDEPENDENT_AMBULATORY_CARE_PROVIDER_SITE_OTHER): Payer: Self-pay | Admitting: Physician Assistant

## 2018-09-03 DIAGNOSIS — M797 Fibromyalgia: Secondary | ICD-10-CM

## 2018-09-03 NOTE — Telephone Encounter (Signed)
FWD to PCP.  S , CMA  

## 2018-09-15 ENCOUNTER — Other Ambulatory Visit: Payer: Self-pay | Admitting: *Deleted

## 2018-09-15 ENCOUNTER — Other Ambulatory Visit: Payer: Self-pay | Admitting: Surgery

## 2018-09-15 ENCOUNTER — Ambulatory Visit (HOSPITAL_COMMUNITY)
Admission: RE | Admit: 2018-09-15 | Discharge: 2018-09-15 | Disposition: A | Discharge status: Home / Self Care | Payer: BLUE CROSS/BLUE SHIELD | Source: Ambulatory Visit | Attending: Surgery | Admitting: Surgery

## 2018-09-15 ENCOUNTER — Telehealth: Payer: Self-pay | Admitting: *Deleted

## 2018-09-15 ENCOUNTER — Encounter: Payer: Self-pay | Admitting: *Deleted

## 2018-09-15 ENCOUNTER — Other Ambulatory Visit: Payer: Self-pay

## 2018-09-15 ENCOUNTER — Ambulatory Visit: Payer: BLUE CROSS/BLUE SHIELD | Admitting: Surgery

## 2018-09-15 ENCOUNTER — Encounter: Payer: Self-pay | Admitting: Surgery

## 2018-09-15 VITALS — BP 122/79 | HR 90 | Temp 98.1°F | Resp 16 | Ht 65.0 in | Wt 143.8 lb

## 2018-09-15 DIAGNOSIS — H539 Unspecified visual disturbance: Secondary | ICD-10-CM | POA: Diagnosis present

## 2018-09-15 DIAGNOSIS — R519 Headache, unspecified: Secondary | ICD-10-CM

## 2018-09-15 DIAGNOSIS — R51 Headache: Secondary | ICD-10-CM | POA: Diagnosis not present

## 2018-09-15 NOTE — Telephone Encounter (Signed)
Call to ESTHER/ PAD to call patient today for surgery in am.

## 2018-09-15 NOTE — Anesthesia Preprocedure Evaluation (Addendum)
Anesthesia Evaluation  Patient identified by MRN, date of birth, ID band Patient awake    Reviewed: Allergy & Precautions, NPO status , Patient's Chart, lab work & pertinent test results  History of Anesthesia Complications Negative for: history of anesthetic complications  Airway Mallampati: I  TM Distance: >3 FB Neck ROM: Full    Dental  (+) Dental Advisory Given, Teeth Intact   Pulmonary neg pulmonary ROS,    breath sounds clear to auscultation       Cardiovascular hypertension, Pt. on medications + CAD   Rhythm:Regular Rate:Normal     Neuro/Psych  Headaches, negative psych ROS   GI/Hepatic Neg liver ROS, GERD  Medicated and Controlled,  Endo/Other   Pre-DM   Renal/GU negative Renal ROS    Bladder prolapse     Musculoskeletal  (+) Arthritis , Fibromyalgia -  Abdominal   Peds  Hematology negative hematology ROS (+)   Anesthesia Other Findings Glaucoma   Reproductive/Obstetrics                            Anesthesia Physical Anesthesia Plan  ASA: III  Anesthesia Plan: MAC   Post-op Pain Management:    Induction: Intravenous  PONV Risk Score and Plan: 2 and Propofol infusion, Treatment may vary due to age or medical condition and Ondansetron  Airway Management Planned: Nasal Cannula and Natural Airway  Additional Equipment: None  Intra-op Plan:   Post-operative Plan:   Informed Consent: I have reviewed the patients History and Physical, chart, labs and discussed the procedure including the risks, benefits and alternatives for the proposed anesthesia with the patient or authorized representative who has indicated his/her understanding and acceptance.     Plan Discussed with: CRNA and Anesthesiologist  Anesthesia Plan Comments: ( If surgeon requesting GA, will place LMA )       Anesthesia Quick Evaluation

## 2018-09-15 NOTE — Progress Notes (Signed)
Pt provided with pre-op instructions only using Spanish interpreter [place] # [phone]. Pt denies any acute cardiopulmonary issues. Pt denies being under the care of a cardiologist. Pt stated that her PCP is Sindy Messingoger Gomez, [place]. Pt denies having a stress test and cardiac cath. Pt denies having an EKG and chest x ray within the last year. Pt denies recent labs. Pt made aware to stop taking vitamins, fish oil and herbal medications. Do not take any NSAIDs ie: Ibuprofen, Advil, Naproxen (Aleve), Motrin, BC and Goody Powder. Please complete pt assessment on DOS. Pt verbalized understanding of all pre-op instructions.

## 2018-09-15 NOTE — Progress Notes (Signed)
[facility]  Patient name: Khalessi Blough MRN: [id number] DOB: [birth year] Sex: female   REQUESTING PROVIDER:    Sindy Messing   REASON FOR CONSULT:    Temporal arteritis  HISTORY OF PRESENT ILLNESS:   Kamiya Acord is a 62 y.o. female, who is referred for evaluation of temporal arteritis.  The patient states that she has had a long history of pain in her right scalp as well as pain that goes down her right arm.  About a month ago she began having short episodes of vision loss.  They last about 3 seconds.  A temporal artery biopsy has been requested.  The patient has history of coronary artery disease.  She takes a statin for hypercholesterolemia.  She is on an ARB for hypertension.  PAST MEDICAL HISTORY    Past Medical History:  Diagnosis Date  . Arthritis    Bil hands  . Bladder prolapse, female, acquired   . Breast mass    left  . Chronic headaches   . Coronary artery disease   . Fibromyalgia   . GERD (gastroesophageal reflux disease)   . Glaucoma   . Hyperlipemia   . Hypertension   . Insomnia   . Prediabetes   . Vitamin D deficiency      FAMILY HISTORY   Family History  Problem Relation Age of Onset  . Hypertension Mother   . Hypertension Father   . Migraines Neg Hx     SOCIAL HISTORY:   Social History   Socioeconomic History  . Marital status: Single    Spouse name: Maisie Fus  . Number of children: 2  . Years of education: 83  . Highest education level: Not on file  Occupational History  . Occupation: Retired  Engineer, production  . Financial resource strain: Not on file  . Food insecurity:    Worry: Not on file    Inability: Not on file  . Transportation needs:    Medical: Not on file    Non-medical: Not on file  Tobacco Use  . Smoking status: Never Smoker  . Smokeless tobacco: Never Used  Substance and Sexual Activity  . Alcohol use: Yes    Comment: SOCIAL  . Drug use: No  .  Sexual activity: Yes  Lifestyle  . Physical activity:    Days per week: Not on file    Minutes per session: Not on file  . Stress: Not on file  Relationships  . Social connections:    Talks on phone: Not on file    Gets together: Not on file    Attends religious service: Not on file    Active member of club or organization: Not on file    Attends meetings of clubs or organizations: Not on file    Relationship status: Not on file  . Intimate partner violence:    Fear of current or ex partner: Not on file    Emotionally abused: Not on file    Physically abused: Not on file    Forced sexual activity: Not on file  Other Topics Concern  . Not on file  Social History Narrative   Lives at home with husband, Maisie Fus.   Caffeine use:     ALLERGIES:    No Known Allergies  CURRENT MEDICATIONS:    Current Outpatient Medications  Medication Sig Dispense Refill  . alendronate (FOSAMAX) 35 MG tablet Take 1 tablet (35 mg total) by mouth every 7 (seven) days. Take with a  full glass of water on an empty stomach. 4 tablet 11  . atorvastatin (LIPITOR) 20 MG tablet Take 1 tablet (20 mg total) by mouth daily. 90 tablet 3  . B Complex-C (SUPER B COMPLEX PO) Take by mouth daily at 6 (six) AM.    . carboxymethylcellulose (REFRESH PLUS) 0.5 % SOLN 1 drop as needed.    . Cholecalciferol (VITAMIN D-3) 5000 units TABS Take by mouth daily at 6 (six) AM.    . DULoxetine (CYMBALTA) 60 MG capsule TAKE 1 CAPSULE BY MOUTH EVERY DAY 90 capsule 2  . gabapentin (NEURONTIN) 300 MG capsule Take 1 capsule (300 mg total) by mouth 3 (three) times daily as needed. 270 capsule 3  . hydrochlorothiazide (HYDRODIURIL) 12.5 MG tablet Take 1 tablet (12.5 mg total) by mouth daily. 30 tablet 5  . losartan (COZAAR) 50 MG tablet Take 1 tablet (50 mg total) by mouth daily. 30 tablet 5  . omeprazole (PRILOSEC) 20 MG capsule TAKE 1 CAPSULE BY MOUTH EVERY DAY 90 capsule 1  . topiramate (TOPAMAX) 25 MG tablet Take 2 tablets (50  mg total) by mouth daily. 60 tablet 5  . triamcinolone cream (KENALOG) 0.1 % Apply 1 application topically 2 (two) times daily. 30 g 0   Current Facility-Administered Medications  Medication Dose Route Frequency Provider Last Rate Last Dose  . 0.9 %  sodium chloride infusion  500 mL Intravenous Once Meryl Dare, MD        REVIEW OF SYSTEMS:   [X]  denotes positive finding, [ ]  denotes negative finding Cardiac  Comments:  Chest pain or chest pressure: x   Shortness of breath upon exertion:    Short of breath when lying flat:    Irregular heart rhythm:        Vascular    Pain in calf, thigh, or hip brought on by ambulation:    Pain in feet at night that wakes you up from your sleep:     Blood clot in your veins:    Leg swelling:         Pulmonary    Oxygen at home:    Productive cough:     Wheezing:         Neurologic    Sudden weakness in arms or legs:  x   Sudden numbness in arms or legs:  x   Sudden onset of difficulty speaking or slurred speech:    Temporary loss of vision in one eye:  x   Problems with dizziness:  x       Gastrointestinal    Blood in stool:      Vomited blood:         Genitourinary    Burning when urinating:     Blood in urine:        Psychiatric    Major depression:         Hematologic    Bleeding problems:    Problems with blood clotting too easily:        Skin    Rashes or ulcers:        Constitutional    Fever or chills:     PHYSICAL EXAM:   Vitals:   09/15/18 1503  BP: 122/79  Pulse: 90  Resp: 16  Temp: 98.1 F (36.7 C)  TempSrc: Oral  SpO2: 95%  Weight: 143 lb 12.8 oz (65.2 kg)  Height: 5\' 5"  (1.651 m)    GENERAL: The patient is a well-nourished female, in no acute  distress. The vital signs are documented above. CARDIAC: There is a regular rate and rhythm.  VASCULAR: No carotid bruits.  Palpable bilateral radial and pedal pulses PULMONARY: Nonlabored respirations ABDOMEN: Soft and non-tender with normal pitched  bowel sounds.  MUSCULOSKELETAL: There are no major deformities or cyanosis. NEUROLOGIC: No focal weakness or paresthesias are detected. SKIN: There are no ulcers or rashes noted. PSYCHIATRIC: The patient has a normal affect.  STUDIES:   I have ordered and reviewed her carotid duplex with the following findings:  Right: 1-39% stenosis Left:  40-59% stenosis  ASSESSMENT and PLAN   Rule out temporal arteritis:  I ordered a carotid duplex to make sure her visual disturbances were not related and this showed no significant right sided stenosis.   I discussed with the patient via her interpreter that we would proceed with a right temporal artery biopsy.  She has a easily identifiable temporal artery doppler signal.  I did discuss that this is in her hairline and so we would have to shave her hair slightly.  All of her questions were answered.  We will proceed tomorrow.  She understands that one of my partners may do her proceedure   Durene CalWells Mariaisabel Bodiford, MD [facility] Tel [phone] Pager [phone]

## 2018-09-16 ENCOUNTER — Encounter (HOSPITAL_COMMUNITY)
Admission: RE | Disposition: A | Discharge status: Home / Self Care | Payer: Self-pay | Source: Ambulatory Visit | Attending: Vascular Surgery

## 2018-09-16 ENCOUNTER — Ambulatory Visit (HOSPITAL_COMMUNITY)
Admission: RE | Admit: 2018-09-16 | Discharge: 2018-09-16 | Disposition: A | Discharge status: Home / Self Care | Payer: BLUE CROSS/BLUE SHIELD | Source: Ambulatory Visit | Attending: Vascular Surgery | Admitting: Vascular Surgery

## 2018-09-16 ENCOUNTER — Other Ambulatory Visit: Payer: Self-pay

## 2018-09-16 ENCOUNTER — Ambulatory Visit (HOSPITAL_COMMUNITY): Payer: BLUE CROSS/BLUE SHIELD | Admitting: Anesthesiology

## 2018-09-16 ENCOUNTER — Encounter (HOSPITAL_COMMUNITY): Payer: Self-pay | Admitting: Certified Registered Nurse Anesthetist

## 2018-09-16 DIAGNOSIS — Z8249 Family history of ischemic heart disease and other diseases of the circulatory system: Secondary | ICD-10-CM | POA: Diagnosis not present

## 2018-09-16 DIAGNOSIS — E559 Vitamin D deficiency, unspecified: Secondary | ICD-10-CM | POA: Diagnosis not present

## 2018-09-16 DIAGNOSIS — Z79899 Other long term (current) drug therapy: Secondary | ICD-10-CM | POA: Insufficient documentation

## 2018-09-16 DIAGNOSIS — E785 Hyperlipidemia, unspecified: Secondary | ICD-10-CM | POA: Diagnosis not present

## 2018-09-16 DIAGNOSIS — R7303 Prediabetes: Secondary | ICD-10-CM | POA: Insufficient documentation

## 2018-09-16 DIAGNOSIS — I10 Essential (primary) hypertension: Secondary | ICD-10-CM | POA: Diagnosis not present

## 2018-09-16 DIAGNOSIS — K219 Gastro-esophageal reflux disease without esophagitis: Secondary | ICD-10-CM | POA: Diagnosis not present

## 2018-09-16 DIAGNOSIS — I2510 Atherosclerotic heart disease of native coronary artery without angina pectoris: Secondary | ICD-10-CM | POA: Diagnosis not present

## 2018-09-16 DIAGNOSIS — R51 Headache: Secondary | ICD-10-CM | POA: Diagnosis not present

## 2018-09-16 DIAGNOSIS — M797 Fibromyalgia: Secondary | ICD-10-CM | POA: Insufficient documentation

## 2018-09-16 HISTORY — PX: ARTERY BIOPSY: SHX891

## 2018-09-16 LAB — COMPREHENSIVE METABOLIC PANEL
ALT: 19 U/L (ref 0–44)
AST: 24 U/L (ref 15–41)
Albumin: 3.5 g/dL (ref 3.5–5.0)
Alkaline Phosphatase: 66 U/L (ref 38–126)
Anion gap: 10 (ref 5–15)
BUN: 17 mg/dL (ref 8–23)
CO2: 23 mmol/L (ref 22–32)
Calcium: 8.4 mg/dL — ABNORMAL LOW (ref 8.9–10.3)
Chloride: 101 mmol/L (ref 98–111)
Creatinine, Ser: 0.79 mg/dL (ref 0.44–1.00)
GFR calc Af Amer: >60 mL/min (ref >60)
GFR calc non Af Amer: >60 mL/min (ref >60)
Glucose, Bld: 103 mg/dL — ABNORMAL HIGH (ref 70–99)
Potassium: 3.9 mmol/L (ref 3.5–5.1)
Sodium: 134 mmol/L — ABNORMAL LOW (ref 135–145)
Total Bilirubin: 0.8 mg/dL (ref 0.3–1.2)
Total Protein: 6.5 g/dL (ref 6.5–8.1)

## 2018-09-16 LAB — CBC
HCT: 39.6 % (ref 36.0–46.0)
Hemoglobin: 12.4 g/dL (ref 12.0–15.0)
MCH: 30.1 pg (ref 26.0–34.0)
MCHC: 31.3 g/dL (ref 30.0–36.0)
MCV: 96.1 fL (ref 80.0–100.0)
Platelets: 269 10*3/uL (ref 150–400)
RBC: 4.12 MIL/uL (ref 3.87–5.11)
RDW: 13 % (ref 11.5–15.5)
WBC: 5.3 10*3/uL (ref 4.0–10.5)
nRBC: 0 % (ref 0.0–0.2)

## 2018-09-16 LAB — GLUCOSE, CAPILLARY: Glucose-Capillary: 103 mg/dL — ABNORMAL HIGH (ref 70–99)

## 2018-09-16 SURGERY — BIOPSY TEMPORAL ARTERY
Anesthesia: Monitor Anesthesia Care | Laterality: Right

## 2018-09-16 MED ORDER — LIDOCAINE HCL (PF) 1 % IJ SOLN
INTRAMUSCULAR | Status: AC
  Filled 2018-09-16: qty 30

## 2018-09-16 MED ORDER — LIDOCAINE HCL (PF) 1 % IJ SOLN
INTRAMUSCULAR | Status: DC | PRN
  Administered 2018-09-16: 9 mL

## 2018-09-16 MED ORDER — SODIUM CHLORIDE 0.9 % IV SOLN: INTRAVENOUS | Status: DC

## 2018-09-16 MED ORDER — PROMETHAZINE HCL 25 MG/ML IJ SOLN: 6.2500 mg | INTRAMUSCULAR | Status: DC | PRN

## 2018-09-16 MED ORDER — FENTANYL CITRATE (PF) 100 MCG/2ML IJ SOLN: 25.0000 ug | INTRAMUSCULAR | Status: DC | PRN

## 2018-09-16 MED ORDER — MIDAZOLAM HCL 5 MG/5ML IJ SOLN
INTRAMUSCULAR | Status: DC | PRN
  Administered 2018-09-16: 2 mg via INTRAVENOUS

## 2018-09-16 MED ORDER — FENTANYL CITRATE (PF) 100 MCG/2ML IJ SOLN
INTRAMUSCULAR | Status: DC | PRN
  Administered 2018-09-16: 25 ug via INTRAVENOUS

## 2018-09-16 MED ORDER — CHLORHEXIDINE GLUCONATE 4 % EX LIQD: 1.0000 "application " | Freq: Once | CUTANEOUS | Status: DC

## 2018-09-16 MED ORDER — PROPOFOL 1000 MG/100ML IV EMUL
INTRAVENOUS | Status: AC
  Filled 2018-09-16: qty 100

## 2018-09-16 MED ORDER — MIDAZOLAM HCL 2 MG/2ML IJ SOLN
INTRAMUSCULAR | Status: AC
  Filled 2018-09-16: qty 2

## 2018-09-16 MED ORDER — LIDOCAINE HCL (CARDIAC) PF 100 MG/5ML IV SOSY
PREFILLED_SYRINGE | INTRAVENOUS | Status: DC | PRN
  Administered 2018-09-16: 30 mg via INTRATRACHEAL

## 2018-09-16 MED ORDER — ONDANSETRON HCL 4 MG/2ML IJ SOLN
INTRAMUSCULAR | Status: AC
  Filled 2018-09-16: qty 2

## 2018-09-16 MED ORDER — 0.9 % SODIUM CHLORIDE (POUR BTL) OPTIME
TOPICAL | Status: DC | PRN
  Administered 2018-09-16: 1000 mL

## 2018-09-16 MED ORDER — LIDOCAINE 2% (20 MG/ML) 5 ML SYRINGE
INTRAMUSCULAR | Status: AC
  Filled 2018-09-16: qty 5

## 2018-09-16 MED ORDER — ONDANSETRON HCL 4 MG/2ML IJ SOLN
INTRAMUSCULAR | Status: DC | PRN
  Administered 2018-09-16: 4 mg via INTRAVENOUS

## 2018-09-16 MED ORDER — LACTATED RINGERS IV SOLN
INTRAVENOUS | Status: DC | PRN
  Administered 2018-09-16: 07:00:00 via INTRAVENOUS

## 2018-09-16 MED ORDER — CEFAZOLIN SODIUM-DEXTROSE 2-4 GM/100ML-% IV SOLN
2.0000 g | INTRAVENOUS | Status: AC
  Administered 2018-09-16: 2 g via INTRAVENOUS
  Filled 2018-09-16: qty 100

## 2018-09-16 MED ORDER — PROPOFOL 500 MG/50ML IV EMUL
INTRAVENOUS | Status: DC | PRN
  Administered 2018-09-16: 100 ug/kg/min via INTRAVENOUS

## 2018-09-16 MED ORDER — FENTANYL CITRATE (PF) 250 MCG/5ML IJ SOLN
INTRAMUSCULAR | Status: AC
  Filled 2018-09-16: qty 5

## 2018-09-16 MED ORDER — PROPOFOL 10 MG/ML IV BOLUS
INTRAVENOUS | Status: DC | PRN
  Administered 2018-09-16: 20 mg via INTRAVENOUS

## 2018-09-16 MED ORDER — PHENYLEPHRINE 40 MCG/ML (10ML) SYRINGE FOR IV PUSH (FOR BLOOD PRESSURE SUPPORT)
PREFILLED_SYRINGE | INTRAVENOUS | Status: AC
  Filled 2018-09-16: qty 10

## 2018-09-16 MED ORDER — PROPOFOL 10 MG/ML IV BOLUS
INTRAVENOUS | Status: AC
  Filled 2018-09-16: qty 20

## 2018-09-16 MED ORDER — OXYCODONE-ACETAMINOPHEN 5-325 MG PO TABS: 1.0000 | ORAL_TABLET | Freq: Four times a day (QID) | ORAL | 0 refills | Status: DC | PRN

## 2018-09-16 MED ORDER — OXYCODONE HCL 5 MG PO TABS: 5.0000 mg | ORAL_TABLET | Freq: Once | ORAL | Status: DC | PRN

## 2018-09-16 MED ORDER — OXYCODONE HCL 5 MG/5ML PO SOLN: 5.0000 mg | Freq: Once | ORAL | Status: DC | PRN

## 2018-09-16 MED ORDER — PHENYLEPHRINE HCL 10 MG/ML IJ SOLN
INTRAMUSCULAR | Status: DC | PRN
  Administered 2018-09-16 (×2): 80 ug via INTRAVENOUS

## 2018-09-16 SURGICAL SUPPLY — 44 items
ADH SKN CLS APL DERMABOND .7 (GAUZE/BANDAGES/DRESSINGS) ×1
BALL CTTN LRG ABS STRL LF (GAUZE/BANDAGES/DRESSINGS) ×1
CANISTER SUCT 3000ML PPV (MISCELLANEOUS) ×2 IMPLANT
CLIP VESOCCLUDE SM WIDE 6/CT (CLIP) ×3 IMPLANT
CONT SPEC 4OZ CLIKSEAL STRL BL (MISCELLANEOUS) ×2 IMPLANT
COTTONBALL LRG STERILE PKG (GAUZE/BANDAGES/DRESSINGS) ×2 IMPLANT
COVER PROBE W GEL 5X96 (DRAPES) ×1 IMPLANT
COVER SURGICAL LIGHT HANDLE (MISCELLANEOUS) ×2 IMPLANT
COVER WAND RF STERILE (DRAPES) ×2 IMPLANT
DECANTER SPIKE VIAL GLASS SM (MISCELLANEOUS) ×2 IMPLANT
DERMABOND ADVANCED (GAUZE/BANDAGES/DRESSINGS) ×1
DERMABOND ADVANCED .7 DNX12 (GAUZE/BANDAGES/DRESSINGS) ×1 IMPLANT
DRAPE OPHTHALMIC 77X100 STRL (CUSTOM PROCEDURE TRAY) ×2 IMPLANT
DRSG OPSITE POSTOP 4X6 (GAUZE/BANDAGES/DRESSINGS) ×1 IMPLANT
ELECT NDL BLADE 2-5/6 (NEEDLE) ×1 IMPLANT
ELECT NEEDLE BLADE 2-5/6 (NEEDLE) ×2 IMPLANT
ELECT REM PT RETURN 9FT ADLT (ELECTROSURGICAL) ×2
ELECTRODE REM PT RTRN 9FT ADLT (ELECTROSURGICAL) ×1 IMPLANT
GAUZE 4X4 16PLY RFD (DISPOSABLE) ×2 IMPLANT
GEL ULTRASOUND 8.5O AQUASONIC (MISCELLANEOUS) ×2 IMPLANT
GLOVE BIO SURGEON STRL SZ7.5 (GLOVE) ×2 IMPLANT
GOWN STRL REUS W/ TWL LRG LVL3 (GOWN DISPOSABLE) ×1 IMPLANT
GOWN STRL REUS W/ TWL XL LVL3 (GOWN DISPOSABLE) ×1 IMPLANT
GOWN STRL REUS W/TWL LRG LVL3 (GOWN DISPOSABLE) ×2
GOWN STRL REUS W/TWL XL LVL3 (GOWN DISPOSABLE) ×2
KIT BASIN OR (CUSTOM PROCEDURE TRAY) ×2 IMPLANT
KIT TURNOVER KIT B (KITS) ×2 IMPLANT
LOOP VESSEL MINI RED (MISCELLANEOUS) ×2 IMPLANT
NDL HYPO 25GX1X1/2 BEV (NEEDLE) ×1 IMPLANT
NEEDLE HYPO 25GX1X1/2 BEV (NEEDLE) ×2 IMPLANT
NS IRRIG 1000ML POUR BTL (IV SOLUTION) ×2 IMPLANT
PACK GENERAL/GYN (CUSTOM PROCEDURE TRAY) ×2 IMPLANT
PAD ARMBOARD 7.5X6 YLW CONV (MISCELLANEOUS) ×4 IMPLANT
SUCTION FRAZIER HANDLE 10FR (MISCELLANEOUS) ×1
SUCTION TUBE FRAZIER 10FR DISP (MISCELLANEOUS) ×1 IMPLANT
SUT MNCRL AB 4-0 PS2 18 (SUTURE) ×2 IMPLANT
SUT PROLENE 6 0 BV (SUTURE) IMPLANT
SUT SILK 3 0 (SUTURE)
SUT SILK 3-0 18XBRD TIE 12 (SUTURE) IMPLANT
SUT VIC AB 3-0 SH 27 (SUTURE) ×2
SUT VIC AB 3-0 SH 27X BRD (SUTURE) ×1 IMPLANT
SYR CONTROL 10ML LL (SYRINGE) ×2 IMPLANT
TOWEL GREEN STERILE (TOWEL DISPOSABLE) ×2 IMPLANT
WATER STERILE IRR 1000ML POUR (IV SOLUTION) ×2 IMPLANT

## 2018-09-16 NOTE — Anesthesia Procedure Notes (Signed)
Procedure Name: MAC Performed by: Nygeria Lager L, CRNA Pre-anesthesia Checklist: Patient identified, Emergency Drugs available, Suction available, Timeout performed and Patient being monitored Patient Re-evaluated:Patient Re-evaluated prior to induction Oxygen Delivery Method: Simple face mask Induction Type: IV induction Dental Injury: Teeth and Oropharynx as per pre-operative assessment        

## 2018-09-16 NOTE — H&P (Signed)
   History and Physical Update  The patient was interviewed and re-examined.  The patient's previous History and Physical has been reviewed and is unchanged from office visit yesterday with Dr. Myra GianottiBrabham. Plan for right temporal artery biopsy.  Sharren Schnurr C. Randie Heinzain, MD [facility] [place] Office: [phone] Pager: [phone]  09/16/2018, 7:15 AM

## 2018-09-16 NOTE — Op Note (Signed)
    Patient name: Amy Cline MRN: [id number] DOB: [birth year] Sex: female  09/16/2018 Pre-operative Diagnosis: Right-sided headaches Post-operative diagnosis:  Same Surgeon:  Erlene Quan C. Donzetta Matters, MD Procedure Performed: Right temporal artery biopsy  Indications: 62 year old female was sent for evaluation of temporal arteritis.  She has a long history of pain in her right scalp radiating down her right arm.  She has been worked up for carotid stenosis and does not have any.  Findings: The temporal artery grossly appeared normal.  5 cm segment was sent to pathology.   Procedure:  The patient was identified in the holding area and taken to the operating room where MAC anesthesia was induced.  She was sterilely prepped and draped in the right face and ear area given antibiotics timeout was called.  We began with Doppler to identify the artery for a several centimeters segment.  The area was anesthetized 1% lidocaine with epinephrine.  Longitudinal incision was made.  The artery was easily identifiable.  Was confirmed with Doppler.  I dissected a 5 cm segment clipped on either end as well as sidebranch and send it for pathology.  The area was irrigated hemostasis was obtained and the skin was closed with 4-0 Monocryl.  Dermabond was placed above that.  She was then awakened from anesthesia having tolerated procedure well without immediate complication.  All counts were correct at completion.  EBL 5 cc  Brandon C. Donzetta Matters, MD [facility] [place] [place] Office: [phone] Pager: [phone]

## 2018-09-16 NOTE — Anesthesia Postprocedure Evaluation (Signed)
Anesthesia Post Note  Patient: Amy Cline  Procedure(s) Performed: BIOPSY TEMPORAL ARTERY RIGHT (Right )     Patient location during evaluation: PACU Anesthesia Type: MAC Level of consciousness: awake and alert Pain management: pain level controlled Vital Signs Assessment: post-procedure vital signs reviewed and stable Respiratory status: spontaneous breathing, nonlabored ventilation and respiratory function stable Cardiovascular status: stable and blood pressure returned to baseline Anesthetic complications: no    Last Vitals:  Vitals:   09/16/18 0835 09/16/18 0850  BP: (!) 146/97 (!) 143/92  Pulse: 72 74  Resp: 13 13  Temp:  (!) 36.2 C  SpO2: 100% 100%    Last Pain:  Vitals:   09/16/18 0850  PainSc: 0-No pain                 Audry Pili

## 2018-09-16 NOTE — Transfer of Care (Signed)
Immediate Anesthesia Transfer of Care Note  Patient: Amy Cline  Procedure(s) Performed: BIOPSY TEMPORAL ARTERY RIGHT (Right )  Patient Location: PACU  Anesthesia Type:MAC  Level of Consciousness: awake and drowsy  Airway & Oxygen Therapy: Patient Spontanous Breathing and Patient connected to face mask oxygen  Post-op Assessment: Report given to RN and Post -op Vital signs reviewed and stable  Post vital signs: Reviewed and stable  Last Vitals:  Vitals Value Taken Time  BP 125/96 09/16/2018  8:22 AM  Temp    Pulse 81 09/16/2018  8:24 AM  Resp 16 09/16/2018  8:24 AM  SpO2 99 % 09/16/2018  8:24 AM  Vitals shown include unvalidated device data.  Last Pain:  Vitals:   09/16/18 0700  PainSc: 0-No pain      Patients Stated Pain Goal: 3 (80/03/49 1791)  Complications: No apparent anesthesia complications

## 2018-09-17 ENCOUNTER — Other Ambulatory Visit (INDEPENDENT_AMBULATORY_CARE_PROVIDER_SITE_OTHER): Payer: Self-pay | Admitting: Physician Assistant

## 2018-09-17 ENCOUNTER — Encounter (HOSPITAL_COMMUNITY): Payer: Self-pay | Admitting: Vascular Surgery

## 2018-09-17 DIAGNOSIS — H539 Unspecified visual disturbance: Secondary | ICD-10-CM

## 2018-09-19 ENCOUNTER — Telehealth (INDEPENDENT_AMBULATORY_CARE_PROVIDER_SITE_OTHER): Payer: Self-pay

## 2018-09-19 NOTE — Telephone Encounter (Signed)
-----   Message from Loletta Specteroger David Gomez, PA-C sent at 09/17/2018  5:40 PM EST ----- Biopsy does not show sign of inflammation. I have made urgent referral to see ophthalmologist since she complained of visual disturbance to vascular. Also, we should monitor symptoms and proceed with another biopsy if symptoms continue.

## 2018-09-19 NOTE — Telephone Encounter (Signed)
Call placed using pacific interpreter [phone]([phone]) patient aware that biopsy did not show signs of inflammation. But if symptoms continue we will proceed with another biopsy. Urgent referral has been placed to ophthalmologist since she complained of visual disturbance. Maryjean Mornempestt S Roberts, CMA

## 2018-09-22 ENCOUNTER — Ambulatory Visit (INDEPENDENT_AMBULATORY_CARE_PROVIDER_SITE_OTHER): Payer: BLUE CROSS/BLUE SHIELD | Admitting: Physician Assistant

## 2018-09-22 ENCOUNTER — Encounter (INDEPENDENT_AMBULATORY_CARE_PROVIDER_SITE_OTHER): Payer: Self-pay | Admitting: Physician Assistant

## 2018-09-22 ENCOUNTER — Other Ambulatory Visit: Payer: Self-pay

## 2018-09-22 VITALS — BP 123/81 | HR 68 | Temp 97.6°F | Ht 65.0 in | Wt 145.6 lb

## 2018-09-22 DIAGNOSIS — R8290 Unspecified abnormal findings in urine: Secondary | ICD-10-CM

## 2018-09-22 DIAGNOSIS — R51 Headache: Secondary | ICD-10-CM | POA: Diagnosis not present

## 2018-09-22 DIAGNOSIS — M797 Fibromyalgia: Secondary | ICD-10-CM

## 2018-09-22 DIAGNOSIS — H539 Unspecified visual disturbance: Secondary | ICD-10-CM

## 2018-09-22 DIAGNOSIS — R519 Headache, unspecified: Secondary | ICD-10-CM

## 2018-09-22 LAB — POCT URINALYSIS DIPSTICK
Blood, UA: NEGATIVE
Glucose, UA: NEGATIVE
Leukocytes, UA: NEGATIVE
Nitrite, UA: NEGATIVE
Protein, UA: POSITIVE — AB
Spec Grav, UA: 1.02 (ref 1.010–1.025)
Urobilinogen, UA: 1 E.U./dL
pH, UA: 6 (ref 5.0–8.0)

## 2018-09-22 NOTE — Progress Notes (Signed)
Subjective:  Patient ID: Mysty Kielty, female    DOB: October 15, 1956  Age: 62 y.o. MRN: [id number]  CC: f/u HA and depression  HPI Jessic Bruneris a 62 y.o.femalewith a medical history of bladder prolapse, herniation of rectum into vagina, breast mass, chronic headaches, CAD, fibromyalgia, GERD, HLD, HTN, insomnia, prediabetes, plantar fascitis, and vitamin D deficiency presents to f/u on HA and depression. Headache associated with scalp tenderness at the crown of head and with visual disturbance of right eye. She was referred to vascular for temporal artery biopsy for a work up of GCA. Result of biopsy was normal. Pt was prescribed topiramate and has felt mildly better. Topiramate has caused nausea initially but feels nausea may be subsiding. Has not been to her ophthalmologist in over a year. Says she was previously diagnosed with glaucoma by her ophthalmologist. Has not returned to ophthalmologist for f/u and would like to referral.     Pt taking duloxetine as directed and is feeling much better in regards to fibromyalgia pain. Pain rated at 3/10. Used to be "twenty out of ten".    Lastly pt, complaining of foul smelling urine. Said she did not have foul smelling urine and her urine was "crystal clear" when she had normal saline infusion prior to her temporal artery biopsy. Says she is hydrating properly but does not know how much water she is drinking.       Outpatient Medications Prior to Visit  Medication Sig Dispense Refill  . alendronate (FOSAMAX) 35 MG tablet Take 1 tablet (35 mg total) by mouth every 7 (seven) days. Take with a full glass of water on an empty stomach. 4 tablet 11  . atorvastatin (LIPITOR) 20 MG tablet Take 1 tablet (20 mg total) by mouth daily. 90 tablet 3  . B Complex-C (SUPER B COMPLEX PO) Take by mouth daily at 6 (six) AM.    . Cholecalciferol (VITAMIN D-3) 5000 units TABS Take by mouth daily at 6 (six) AM.    . DULoxetine (CYMBALTA) 60 MG capsule TAKE 1 CAPSULE BY  MOUTH EVERY DAY 90 capsule 2  . gabapentin (NEURONTIN) 300 MG capsule Take 1 capsule (300 mg total) by mouth 3 (three) times daily as needed. 270 capsule 3  . hydrochlorothiazide (HYDRODIURIL) 12.5 MG tablet Take 1 tablet (12.5 mg total) by mouth daily. 30 tablet 5  . losartan (COZAAR) 50 MG tablet Take 1 tablet (50 mg total) by mouth daily. 30 tablet 5  . omeprazole (PRILOSEC) 20 MG capsule TAKE 1 CAPSULE BY MOUTH EVERY DAY 90 capsule 1  . oxyCODONE-acetaminophen (PERCOCET/ROXICET) 5-325 MG tablet Take 1 tablet by mouth every 6 (six) hours as needed. 6 tablet 0  . topiramate (TOPAMAX) 25 MG tablet Take 2 tablets (50 mg total) by mouth daily. 60 tablet 5  . carboxymethylcellulose (REFRESH PLUS) 0.5 % SOLN 1 drop as needed.    . triamcinolone cream (KENALOG) 0.1 % Apply 1 application topically 2 (two) times daily. (Patient not taking: Reported on 09/22/2018) 30 g 0   Facility-Administered Medications Prior to Visit  Medication Dose Route Frequency Provider Last Rate Last Dose  . 0.9 %  sodium chloride infusion  500 mL Intravenous Once Meryl Dare, MD         ROS Review of Systems  Constitutional: Negative for chills, fever and malaise/fatigue.  Eyes: Negative for blurred vision.  Respiratory: Negative for shortness of breath.   Cardiovascular: Negative for chest pain and palpitations.  Gastrointestinal: Negative for abdominal pain and nausea.  Genitourinary: Negative for dysuria and hematuria.  Musculoskeletal: Positive for myalgias (mild). Negative for joint pain.  Skin: Negative for rash.  Neurological: Positive for headaches. Negative for tingling.  Psychiatric/Behavioral: Negative for depression. The patient is not nervous/anxious.     Objective:  BP 123/81 (BP Location: Left Arm, Patient Position: Sitting, Cuff Size: Normal)   Pulse 68   Temp 97.6 F (36.4 C) (Oral)   Ht 5\' 5"  (1.651 m)   Wt 145 lb 9.6 oz (66 kg)   SpO2 99%   BMI 24.23 kg/m   BP/Weight 09/22/2018  09/16/2018 09/15/2018  Systolic BP 123 143 122  Diastolic BP 81 92 79  Wt. (Lbs) 145.6 143.8 143.8  BMI 24.23 23.93 23.93      Physical Exam  Constitutional: She is oriented to person, place, and time.  Well developed, well nourished, NAD, polite  HENT:  Head: Normocephalic and atraumatic.  Eyes: Pupils are equal, round, and reactive to light. Conjunctivae and EOM are normal. No scleral icterus.  Neck: Normal range of motion. Neck supple. No thyromegaly present.  Cardiovascular: Normal rate, regular rhythm and normal heart sounds.  Pulmonary/Chest: Effort normal and breath sounds normal.  Musculoskeletal: She exhibits no edema.  Neurological: She is alert and oriented to person, place, and time.  Skin: Skin is warm and dry. No rash noted. No erythema. No pallor.  Incisional site on right temporal region healing well and without sign of infection.   Psychiatric: She has a normal mood and affect. Her behavior is normal. Thought content normal.  Vitals reviewed.    Assessment & Plan:    1. Fibromyalgia - Doing much better with Cymbalta and increased dose of Gabapentin.  2. Nonintractable headache, unspecified chronicity pattern, unspecified headache type - Ambulatory referral to Neurology - Continue Topiramate 50 mg qhs as there has been mild decrease of headache. Pt does not want to increase dose.   3. Visual disturbance - Ambulatory referral to Ophthalmology  4. Foul smelling urine - Urinalysis Dipstick unremarkable.      Follow-up: Return in about 12 weeks (around 12/15/2018) for headache.   Loletta Specteroger David Naitik Hermann PA

## 2018-09-22 NOTE — Patient Instructions (Signed)
Dolor de cabeza general sin causa (General Headache Without Cause) El dolor de cabeza es un dolor o malestar que se siente en la zona de la cabeza o del cuello. Hay muchas causas y tipos de dolores de cabeza. En algunos casos, es posible que no se encuentre la causa. CUIDADOS EN EL HOGAR Control del dolor  Tome los medicamentos de venta libre y los recetados solamente como se lo haya indicado el mdico.  Cuando sienta dolor de cabeza acustese en un cuarto oscuro y tranquilo.  Si se lo indican, aplique hielo sobre la cabeza y la zona del cuello: ? Ponga el hielo en una bolsa plstica. ? Coloque una toalla entre la piel y la bolsa de hielo. ? Coloque el hielo durante 20minutos, 2 a 3veces por da.  Utilice una almohadilla trmica o tome una ducha con agua caliente para aplicar calor en la cabeza y la zona del cuello como se lo haya indicado el mdico.  Mantenga las luces tenues si le molesta las luces brillantes o sus dolores de cabeza empeoran. Comida y bebida  Mantenga un horario para las comidas.  Beba menos alcohol.  Consuma menos o deje de tomar cafena. Instrucciones generales  Concurra a todas las visitas de control como se lo haya indicado el mdico. Esto es importante.  Lleve un registro diario para averiguar si ciertas cosas provocan los dolores de cabeza. Por ejemplo, escriba los siguientes datos: ? Lo que usted come y bebe. ? Cunto tiempo duerme. ? Algn cambio en su dieta o en los medicamentos.  Realice actividades relajantes, como recibir masajes.  Disminuya el nivel de estrs.  Sintese con la espalda recta. No contraiga (tensione) los msculos.  No consuma productos que contengan tabaco. Estos incluyen cigarrillos, tabaco para mascar y cigarrillos electrnicos. Si necesita ayuda para dejar de fumar, consulte al mdico.  Haga ejercicios con regularidad tal como se lo indic el mdico.  Duerma lo suficiente. Esto a menudo significa entre 7 y 9horas de  sueo. SOLICITE AYUDA SI:  Los medicamentos no logran aliviar los sntomas.  Tiene un dolor de cabeza que es diferente a los otros dolores de cabeza.  Tiene malestar estomacal (nuseas) o vomita.  Tiene fiebre. SOLICITE AYUDA DE INMEDIATO SI:  El dolor de cabeza empeora.  Sigue vomitando.  Presenta rigidez en el cuello.  Tiene dificultad para ver.  Tiene dificultad para hablar.  Siente dolor en el ojo o en el odo.  Sus msculos estn dbiles, o pierde el control muscular.  Pierde el equilibrio o tiene problemas para caminar.  Siente que se desvanece (pierde el conocimiento) o se desmaya.  Se siente confundido. Esta informacin no tiene como fin reemplazar el consejo del mdico. Asegrese de hacerle al mdico cualquier pregunta que tenga. Document Released: 12/24/2011 Document Revised: 06/22/2015 Document Reviewed: 01/24/2015 Elsevier Interactive Patient Education  2018 Elsevier Inc.  

## 2018-09-24 ENCOUNTER — Encounter: Payer: Self-pay | Admitting: Obstetrics & Gynecology

## 2018-09-24 ENCOUNTER — Ambulatory Visit: Payer: BLUE CROSS/BLUE SHIELD | Admitting: Obstetrics & Gynecology

## 2018-09-24 VITALS — BP 128/84 | HR 85 | Ht 65.0 in | Wt 145.0 lb

## 2018-09-24 DIAGNOSIS — N816 Rectocele: Secondary | ICD-10-CM

## 2018-09-24 NOTE — Progress Notes (Signed)
Patient ID: Amy Cline, female   DOB: Jul 02, 1956, 62 y.o.   MRN: [id number]  Chief Complaint  Patient presents with  . Gynecologic Exam   H/o pelvic prolapse, previous repair HPI Amy Cline is a 62 y.o. female.  [id number] S/p TAH in 2008 and prolapse surgery in [place], has worsening sx of bulging from the vagina.No urinary sx HPI  Past Medical History:  Diagnosis Date  . Arthritis    Bil hands  . Bladder prolapse, female, acquired   . Breast mass    left  . Chronic headaches   . Coronary artery disease   . Fibromyalgia   . GERD (gastroesophageal reflux disease)   . Glaucoma   . Hyperlipemia   . Hypertension   . Insomnia   . Prediabetes   . Vitamin D deficiency     Past Surgical History:  Procedure Laterality Date  . ABDOMINAL HYSTERECTOMY  2008  . APPENDECTOMY    . ARTERY BIOPSY Right 09/16/2018   Procedure: BIOPSY TEMPORAL ARTERY RIGHT;  Surgeon: Maeola Harman, MD;  Location[facility];  Service: Vascular;  Laterality: Right;  . BREAST BIOPSY     benign/left breast    Family History  Problem Relation Age of Onset  . Hypertension Mother   . Hypertension Father   . Migraines Neg Hx     Social History Social History   Tobacco Use  . Smoking status: Never Smoker  . Smokeless tobacco: Never Used  Substance Use Topics  . Alcohol use: Yes    Comment: SOCIAL  . Drug use: No    No Known Allergies  Current Outpatient Medications  Medication Sig Dispense Refill  . alendronate (FOSAMAX) 35 MG tablet Take 1 tablet (35 mg total) by mouth every 7 (seven) days. Take with a full glass of water on an empty stomach. 4 tablet 11  . atorvastatin (LIPITOR) 20 MG tablet Take 1 tablet (20 mg total) by mouth daily. 90 tablet 3  . B Complex-C (SUPER B COMPLEX PO) Take by mouth daily at 6 (six) AM.    . carboxymethylcellulose (REFRESH PLUS) 0.5 % SOLN 1 drop as needed.    . Cholecalciferol (VITAMIN D-3) 5000 units TABS Take by mouth daily at 6 (six) AM.    .  DULoxetine (CYMBALTA) 60 MG capsule TAKE 1 CAPSULE BY MOUTH EVERY DAY 90 capsule 2  . gabapentin (NEURONTIN) 300 MG capsule Take 1 capsule (300 mg total) by mouth 3 (three) times daily as needed. 270 capsule 3  . hydrochlorothiazide (HYDRODIURIL) 12.5 MG tablet Take 1 tablet (12.5 mg total) by mouth daily. 30 tablet 5  . losartan (COZAAR) 50 MG tablet Take 1 tablet (50 mg total) by mouth daily. 30 tablet 5  . omeprazole (PRILOSEC) 20 MG capsule TAKE 1 CAPSULE BY MOUTH EVERY DAY 90 capsule 1  . oxyCODONE-acetaminophen (PERCOCET/ROXICET) 5-325 MG tablet Take 1 tablet by mouth every 6 (six) hours as needed. 6 tablet 0  . topiramate (TOPAMAX) 25 MG tablet Take 2 tablets (50 mg total) by mouth daily. 60 tablet 5  . triamcinolone cream (KENALOG) 0.1 % Apply 1 application topically 2 (two) times daily. (Patient not taking: Reported on 09/22/2018) 30 g 0   Current Facility-Administered Medications  Medication Dose Route Frequency Provider Last Rate Last Dose  . 0.9 %  sodium chloride infusion  500 mL Intravenous Once Meryl Dare, MD        Review of Systems Review of Systems  Constitutional: Negative.   Respiratory: Negative.  Gastrointestinal: Negative.   Genitourinary: Negative for difficulty urinating, frequency and pelvic pain.       Prolapse    Blood pressure 128/84, pulse 85, height 5\' 5"  (1.651 m), weight 65.8 kg.  Physical Exam Physical Exam  Constitutional: She appears well-developed. No distress.  HENT:  Head: Normocephalic.  Eyes: Pupils are equal, round, and reactive to light.  Neck: Normal range of motion.  Cardiovascular: Normal rate.  Pulmonary/Chest: Effort normal.  Abdominal: Soft. She exhibits no distension.  Genitourinary: No vaginal discharge found.  Genitourinary Comments: Cervix absent, cuff well supported no cystocele, 3rd degree rectocele, mucosa nl, no mass  Vitals reviewed. Breasts: breasts appear normal, no suspicious masses, no skin or nipple changes or  axillary nodes.   Data Reviewed Office notes Dr Parke SimmersBland 09/20/2016  Assessment    Sx rectocele, previous prolapse surgery and hysterectomy    Plan    [facility] C[facility] for [facility] re: possible rectocele repair due to h/o previous surgery and recurrence   I reassured her that this is a benign condition    Scheryl DarterJames Arnold 09/24/2018, 10:05 AM

## 2018-09-24 NOTE — Patient Instructions (Signed)
Colporrafa anterior y posterior (Anterior and Posterior Colporrhaphy) La colporrafa anterior o posterior es una ciruga que se realiza para reparar un prolapso de los rganos del tracto genital. Prolapso significa la cada, abultamiento, descenso o bajada de un rgano. Los rganos que habitualmente prolapsan son el recto, la vejiga, la vagina y Careers information officer. Un prolapso puede afectar a un nico rgano o a varios rganos al [employer]. Con frecuencia empeora cuando la mujer deja de tener sus perodos menstruales (menopausia) ya que la prdida de estrgenos debilitan los msculos y tejidos del tracto genital. Adems, el prolapso ocurre cuando los rganos estn daados o debilitados. Con frecuencia ocurre despus de un parto y como resultado del envejecimiento. La ciruga generalmente se indica para prolapsos graves. El tipo de colporrafa que se realiza depende del tipo de prolapso genital. Los tipos de prolapso genital incluyen:  Cistocele. Este es el prolapso de la pared superior (anterior) de la vagina. La pared anterior se abulta hacia la vagina y lleva la vejiga con ella.  Rectocele. Este es el prolapso de la pared inferior (posterior) de la vagina. La pared posterior se abulta hacia la vagina y lleva el recto con ella.  Enterocele. Este es un prolapso de una parte de los rganos plvicos llamado saco de [place]. Tambin involucra a una porcin del intestino delgado. Aparece como un abultamiento debajo del cuello del tero en la parte superior de la pared posterior de la vagina.  Procidencia. Es el prolapso completo del tero y el cuello. El prolapso puede verse y sentirse como que sale de la vagina. INFORME A SU MDICO:  Cualquier alergia que tenga.  Todos los [employer] [place], incluyendo vitaminas, hierbas, gotas oftlmicas, cremas y [address] de [address].  Problemas previos que usted o los [employer] de su familia hayan tenido con el uso de anestsicos.  Enfermedades de [employer] [facility].  Cirugas previas.  Padecimientos mdicos.  Antecedentes de fumador o de consumo de alcohol.  Posibilidad de embarazo, si correspondiera.  RIESGOS Y COMPLICACIONES Generalmente, la colporrafa anterior o posterior es un procedimiento seguro. Sin embargo, Tree surgeon procedimiento, pueden surgir complicaciones. Las complicaciones posibles son:  Infeccin.  Daos a otros Therapist, art.  Sangrado despus de la [place].  Dificultad para orinar.  Problemas con la anestesia. ANTES DEL PROCEDIMIENTO  Consulte a su mdico si debe cambiar o suspender los medicamentos que toma habitualmente.  No debe comer ni beber nada durante al menos 8 horas antes de la [place].  Si fuma, no lo haga al [employer] previas a la [place].  Haga arreglos para que alguien la lleve a su casa despus la hospitalizacin. Pdale a alguna persona que la ayude con sus actividades [place] se recupera.  PROCEDIMIENTO Antes del procedimiento le darn un medicamento para que pueda relajarse (sedante). Le aplicarn un medicamento para que se duerma (anestesia general) o un medicamento que la adormecer de la cintura para abajo (anestesia espinal). Este medicamento se aplica a travs de una va intravenosa (IV) que se coloca en una vena. El procedimiento variar segn el tipo de reparacin:  Reparacin anterior. Se realiza un corte (incisin) en la zona media de la parte anterior de la pared vaginal. Se retira una pieza de forma rectangular de tejido vaginal, y se sutura un tejido ms fuerte y sano, para que de soporte y suspensin a la vejiga.  Reparacin posterior. Se realiza una incisin en la lnea media en la pared posterior de la vagina. Se  retira una porcin triangular de piel de la vagina para exponer el msculo. El exceso de tejido se retira y se suturan juntos un msculo y un ligamento ms fuerte y ms sano para Medical illustratorsostener el recto.  Reparacin anterior y posterior.  Ambos procedimientos se realizan en la misma ciruga. DESPUS DEL PROCEDIMIENTO La trasladarn a una sala de recuperacin. Le controlarn la presin arterial, el pulso, la respiracin y Retail buyerla temperatura (signos vitales). Estos se controlan hasta que se encuentre estable. Luego la trasladarn a una habitacin comn en el hospital. Despus de la ciruga tendr un pequeo tubo de goma en el lugar para drenar la vejiga (catter urinario). Este se Database administratordejar en el lugar durante 2 a 7 das o hasta que la vejiga funcione adecuadamente por s misma. El acceso IV ser retirado en 1 a 3 das. Le colocarn una gasa en la vagina para evitar el sangrado. Esta se retirar en 2 o 3 das despus de la [place]. Ser necesario que Administrator, Civil Servicepermanezca en el hospital durante 3 a 5 das. Esta informacin no tiene Theme park managercomo fin reemplazar el consejo del mdico. Asegrese de hacerle al mdico cualquier pregunta que tenga. Document Released: 06/03/2013 Document Revised: 06/03/2013 Document Reviewed: 02/20/2013 Elsevier Interactive Patient Education  2017 [employer]

## 2018-09-24 NOTE — Progress Notes (Signed)
New GYN presents for Prolapse.  Last PAP 05/23/2016.  Last Mammo 01/29/18

## 2018-10-02 ENCOUNTER — Encounter: Payer: BLUE CROSS/BLUE SHIELD | Admitting: Vascular Surgery

## 2018-10-06 ENCOUNTER — Other Ambulatory Visit (INDEPENDENT_AMBULATORY_CARE_PROVIDER_SITE_OTHER): Payer: Self-pay | Admitting: Physician Assistant

## 2018-10-06 DIAGNOSIS — E785 Hyperlipidemia, unspecified: Secondary | ICD-10-CM

## 2018-10-06 NOTE — Telephone Encounter (Signed)
FWD to PCP. Shadman Tozzi S Hani Patnode, CMA  

## 2018-11-18 ENCOUNTER — Encounter: Payer: Self-pay | Admitting: Neurology

## 2018-11-18 ENCOUNTER — Ambulatory Visit: Payer: Self-pay | Admitting: Neurology

## 2018-11-18 VITALS — BP 130/74 | HR 84 | Ht 63.0 in | Wt 148.0 lb

## 2018-11-18 DIAGNOSIS — G43709 Chronic migraine without aura, not intractable, without status migrainosus: Secondary | ICD-10-CM

## 2018-11-18 MED ORDER — SUMATRIPTAN SUCCINATE 100 MG PO TABS: 100.0000 mg | ORAL_TABLET | Freq: Once | ORAL | 12 refills | Status: DC | PRN

## 2018-11-18 MED ORDER — ONDANSETRON 4 MG PO TBDP: 4.0000 mg | ORAL_TABLET | Freq: Three times a day (TID) | ORAL | 3 refills | Status: DC | PRN

## 2018-11-18 MED ORDER — NORTRIPTYLINE HCL 25 MG PO CAPS: 25.0000 mg | ORAL_CAPSULE | Freq: Every day | ORAL | 3 refills | Status: DC

## 2018-11-18 NOTE — Patient Instructions (Addendum)
Take nortriptyline every night. This will help prevent headaches When you have a headache: Take sumatriptan right away and you can repeat it once in 2 hours. Maximum twice daily Ondansetron: can take for nausea   Cefalea migraosa Migraine Headache  Una cefalea migraosa es un dolor intenso y punzante en uno o ambos lados de la cabeza. Las migraas tambin pueden causar otros sntomas, como nuseas, vmitos y sensibilidad a la luz y el ruido. Cules son las causas? Hay ciertos factores que pueden provocar migraas, como los siguientes:  Alcohol.  Fumar.  Medicamentos, por ejemplo: ? Medicamentos para Engineer, materials torcico (nitroglicerina). ? Pldoras anticonceptivas. ? Comprimidos de estrgeno. ? Ciertos medicamentos para la presin arterial.  Quesos curados, chocolate o cafena.  Los alimentos o las bebidas que contienen nitratos, glutamato, aspartamo o tiramina.  Realizar actividad fsica. Otros factores que pueden provocar migraa incluyen los siguientes:  La menstruacin  [place].  Hambre.  Estrs, poco o demasiado sueo, o fatiga.  Los cambios climticos. Qu incrementa el riesgo? Los siguientes factores pueden hacer que usted sea ms propenso a Warehouse manager migraas:  La edad. Los riesgos aumentan con la edad.  Antecedentes familiares de migraa.  Ser de Engineer, manufacturing.  Depresin y ansiedad.  Obesidad.  Ser mujer.  Tener un agujero en el corazn (persistencia del agujero oval) u otros problemas cardacos. Cules son los signos o los sntomas? El principal sntoma de esta afeccin es el dolor intenso y punzante. El dolor:  Puede aparecer en cualquier regin de la cabeza, tanto de un lado como de [place].  Puede interferir con las actividades de la vida cotidiana.  Puede empeorar con la actividad fsica.  Puede empeorar ante la exposicin a luces brillantes o a ruidos fuertes. Otros sntomas pueden incluir lo  siguiente:  Nuseas.  Vmitos.  Mareos.  Sensibilidad general a las luces brillantes, a los ruidos fuertes o a los [employer] Brands. Antes de sufrir una migraa, puede percibir seales de advertencia (aura). Un aura puede incluir:  Ver luces intermitentes o tener puntos ciegos.  Ver puntos brillantes, halos o lneas en zigzag.  Tener una visin en tnel o visin borrosa.  Sentir entumecimiento u hormigueo.  Tener dificultad para hablar.  Debilidad muscular. Cmo se diagnostica? La cefalea migraosa se diagnostica en funcin de lo siguiente:  Sus sntomas.  Un examen fsico.  Estudios, como una tomografa computarizada o una resonancia magntica de la cabeza. Estos estudios de diagnstico por imgenes pueden ayudar a Teacher, early years/pre causas de cefalea.  Lauris Poag de lquido cefalorraqudeo (puncin lumbar) para Chiropractor (anlisis de lquido cefalorraqudeo o anlisis de LCR). Cmo se trata? Las cefaleas migraosas suelen tratarse con medicamentos que:  Associate Professor.  Alivian las nuseas.  Evitan la recurrencia de las migraas. El tratamiento tambin puede incluir lo siguiente:  Acupuntura.  Cambios en el estilo de vida, como evitar alimentos que provoquen [place]. Siga estas instrucciones en su casa: Medicamentos  [employer] de venta libre y los recetados solamente como se lo haya indicado el mdico.  No conduzca ni use maquinaria pesada mientras toma analgsicos recetados.  A fin de prevenir o tratar el estreimiento mientras toma analgsicos recetados, el mdico puede recomendarle lo siguiente: ? Beba suficiente lquido para mantener la orina clara o de color amarillo plido. ? Tomar medicamentos recetados o de [employer]. ? Consumir alimentos ricos en fibra, como frutas y verduras frescas, cereales integrales y frijoles. ? Limitar el consumo de alimentos con alto contenido de grasas y azcares procesados,  como alimentos fritos o dulces. Estilo de  vida  Evite el consumo de alcohol.  No consuma ningn producto que contenga nicotina o tabaco, como cigarrillos y Administrator, Civil Servicecigarrillos electrnicos. Si necesita ayuda para dejar de fumar, consulte al mdico.  Duerma como mnimo 8horas todas las noches.  Evite las situaciones de estrs. Instrucciones generales   Lleve un registro diario para Financial risk analystaveriguar lo que Advice workerpuede provocar las cefaleas migraosas. Por ejemplo, escriba: ? Lo que usted come y bebe. ? Cunto tiempo duerme. ? Algn cambio en su dieta o en los medicamentos.  Si tiene una migraa: ? Evite los factores que CSX Corporationempeoren los sntomas, como las luces brillantes. ? Resulta til acostarse en una habitacin oscura y silenciosa. ? No conduzca vehculos ni opere maquinaria pesada. ? Pregntele al mdico qu actividades son seguras para usted cuando tiene sntomas.  Concurra a todas las visitas de control como se lo haya indicado el mdico. Esto es importante. Comunquese con un mdico si:  Tiene sntomas de migraa distintos o ms intensos que los habituales. Solicite ayuda de inmediato si:  La migraa se hace cada vez ms intensa.  Tiene fiebre.  Presenta rigidez en el cuello.  Tiene prdida de visin.  Siente debilidad en los msculos o que no puede controlarlos.  Comienza a perder el equilibrio con frecuencia.  Comienza a tener dificultades para caminar.  Se desmaya. Esta informacin no tiene Theme park managercomo fin reemplazar el consejo del mdico. Asegrese de hacerle al mdico cualquier pregunta que tenga. Document Released: 10/01/2005 Document Revised: 01/08/2017 Document Reviewed: 03/19/2016 Elsevier Interactive Patient Education  2019 Elsevier Inc.  Sumatriptan tablets Qu es Rocky Boy's Agencyeste medicamento? El SUMATRIPTAN se [place] Islandsutiliza para tratar las migraas con o sin aura. Una aura es una sensacin o perturbacin visual particular que advierte sobre el ataque. Este medicamento no se [place] Islandsutiliza para prevenir ataques de [place] Rivermigraa. Este medicamento  puede ser utilizado para otros usos; si tiene alguna pregunta consulte con su proveedor de atencin mdica o con su farmacutico. MARCAS COMUNES: Imitrex, Migraine Pack Qu le debo informar a mi profesional de la salud antes de tomar este medicamento? Necesitan saber si usted presenta alguno de los siguientes problemas o situaciones: problemas de Dispensing opticiancirculacin en los dedos de manos y pies diabetes enfermedad cardiaca alta presin sangunea alto nivel de colesterol antecedentes de ritmo cardiaco irregular antecedentes de derrame cerebral enfermedad renal enfermedad heptica es posmenopusica o se le ha realizado una extraccin Barbadosquirrgica del tero y los ovarios convulsiones fuma tabaco problemas estomacales o intestinales una reaccin alrgica o inusual al sumatriptn, a otros medicamentos, alimentos, colorantes o conservantes si est embarazada o buscando quedar embarazada si est amamantando a un beb Cmo debo SLM Corporationutilizar este medicamento? Tome este medicamento por va oral con un vaso de agua. Siga las instrucciones de la etiqueta del Mosbymedicamento. Utilice este medicamento al sentir los primeros sntomas de un ataque de Moxeemigraa. Este medicamento no es para uso cotidiano. Si despus de una dosis su migraa reaparece, puede tomar otra dosis segn las indicaciones dadas. Debe haber un intervalo de por lo menos 2 horas entre una dosis y la siguiente. No utilice ms de 100 mg por dosis. No tome ms de 200 mg en total por cada perodo de 24 hs. Si los sntomas no mejoran en absoluto despus de la primera dosis, no tome una segunda dosis sin consultar con su mdico o con su profesional de Radiographer, therapeuticla salud. No tome su medicamento con una frecuencia mayor que la indicada. Hable con su pediatra para informarse acerca del uso  de este medicamento en nios. Puede requerir atencin especial. Sobredosis: Pngase en contacto inmediatamente con un centro toxicolgico o una sala de urgencia si usted cree que haya tomado demasiado  medicamento. ATENCIN: [employer] es solo para usted. No comparta este medicamento con nadie. Qu sucede si me olvido de una dosis? No se aplica en este caso. Este medicamento no es para uso regular. Qu puede interactuar con este medicamento? No tome esta medicina con ninguno de los siguientes medicamentos: cocana alcaloides ergotnicos, tales como dihidroergotamina, ergonovina, ergotamina, metilergonovina matricaria IMAO, tales como [place], [place], [place], [place] y Parnate otros medicamentos para la migraa, como almotriptn, eletriptn, frovatriptn, naratriptn, rizatriptn, zolmitriptn triptfano Esta medicina tambin puede interactuar con los siguientes medicamentos: ciertos medicamentos para la depresin, ansiedad o trastornos psicticos Puede ser que esta lista no menciona todas las posibles interacciones. Informe a su profesional de [employer] de In[employer] Inc productos a base de hierbas, medicamentos de [place] o suplementos nutritivos que est tomando. Si usted fuma, consume bebidas alcohlicas o si utiliza drogas ilegales, indqueselo tambin a su profesional de [employer]. Algunas sustancias pueden interactuar con su medicamento. A qu debo estar atento al usar PPL Corporation? Slo tome este medicamento cuando tenga migraas. Tmelo al sentir sntomas de advertencia o al comienzo de un ataque de migraa. No debe utilizarlo en forma regular para prevenir migraas. Puede experimentar somnolencia o mareos. No conduzca ni utilice maquinaria ni haga nada que Scientist, research (life sciences) en estado de alerta hasta que sepa cmo le afecta este medicamento. Para reducir los Kellogg, no se siente ni se ponga de pie con rapidez, especialmente si es un paciente de edad avanzada. El alcohol puede aumentar su somnolencia, su sensacin de [place] y su enrojecimiento. Evite consumir bebidas alcohlicas. Al fumar, puede aumentar el riesgo de efectos secundarios relacionados con el corazn por  el uso de [place]. Si toma medicamentos para migraas por 10 das o ms en un mes, las migraas pueden [address] Mantenga un diario de [address]  [address] de [address] de [place] y el uso de [place]. Consulte a su profesional de la salud si los ataques de migraas ocurren con ms frecuencia. Qu efectos secundarios puedo tener al [employer] este medicamento? Efectos secundarios que debe informar a su mdico o a Producer, television/film/video de la salud tan pronto como sea posible: Therapist, art, como erupcin cutnea, picazn o urticarias, e hinchazn de la cara, los labios o la lengua diarrea con sangre o acuosa alucinaciones, prdida del contacto con la realidad Engineer, mining, hormigueo, entumecimiento de la cara, las manos o los pies convulsiones signos y sntomas de un cogulo sanguneo, tales como problemas respiratorios; cambios en la visin; dolor en el pecho; dolor de cabeza severo, repentino; dolor, hinchazn, calor en la pierna; dificultad para hablar; entumecimiento o debilidad repentina de la cara, el brazo o la pierna signos y sntomas de un cambio peligroso en el pulso o ritmo cardiaco, tales como dolor en el pecho; mareos; ritmo cardiaco rpido o irregular; palpitaciones; sensacin de desmayos o aturdimiento; cadas; problemas respiratorios signos y sntomas de un derrame cerebral, tales como cambios en la visin; confusin; dificultad para hablar o entender; dolores de cabeza severos; entumecimiento o debilidad repentina de la cara, el brazo o la pierna; problemas al caminar; Research scientist (life sciences); prdida del equilibrio o la coordinacin dolor estomacal Efectos secundarios que generalmente no requieren atencin mdica (infrmelos a su mdico o a su profesional de la salud si persisten o si son molestos): cambios en el sentido del gusto enrojecimiento  del rostro dolor de cabeza calambres musculares dolor muscular nuseas, vmito debilidad o cansancio Puede ser que esta lista no menciona todos los posibles efectos secundarios.  Comunquese a su mdico por asesoramiento mdico [employer]lett[employer]ard. Usted puede informar los efectos secundarios a la FDA por telfono al 1-800-FDA-1088. Dnde debo guardar mi medicina? Mantngala fuera del alcance de los nios. Gurdela a [employer]SCI, entre 2 y 30 grados C (36 y 52 grados F). Deseche todo el medicamento que no haya utilizado, despus de la fecha de vencimiento. ATENCIN: Este folleto es un resumen. Puede ser que no cubra toda la posible informacin. Si usted tiene preguntas acerca de esta medicina, consulte con su mdico, su farmacutico o su profesional de Radiographer, therapeutic.  2019 Elsevier/Gold Standard (2016-01-16 00:00:00)   Nortriptyline capsules Qu es este medicamento? La NORTRIPTILINA se utiliza para tratar la depresin. Este medicamento puede ser utilizado para otros usos; si tiene alguna pregunta consulte con su proveedor de atencin mdica o con su farmacutico. MARCAS COMUNES: Aventyl, Pamelor Qu le debo informar a mi profesional de la salud antes de tomar este medicamento? Necesitan saber si usted presenta alguno de los siguientes problemas o situaciones: trastorno bipolar sndrome de [place] dificultad para orinar glaucoma enfermedad cardiaca si bebe alcohol enfermedad heptica esquizofrenia convulsiones ideas, planes o intento de suicidio; si usted o alguien de su familia ha intentado suicidarse previamente enfermedad tiroidea una reaccin alrgica o inusual a la nortriptilina, a otros antidepresivos tricclicos, a otros medicamentos, alimentos, colorantes o conservantes si est embarazada o buscando quedar embarazada si est amamantando a un beb Cmo debo utilizar este medicamento? Tome este medicamento por va oral con un vaso de agua. Siga las instrucciones de la etiqueta del [place]. Tome sus dosis a intervalos regulares. No lo tome con una frecuencia mayor a la indicada. No deje de tomar PPL Corporation de repente a menos que as lo  indique su mdico. Dejar de Visual merchandiser medicamento demasiado rpido puede causar efectos secundarios graves o podra empeorar su afeccin. Su farmacutico le dar una [place] del medicamento especial (MedGuide, nombre en ingls) con cada receta y en cada ocasin que la vuelva a surtir. Asegrese de leer esta informacin cada vez cuidadosamente. Hable con su pediatra para informarse acerca del uso de este medicamento en nios. Puede requerir atencin especial. Sobredosis: Pngase en contacto inmediatamente con un centro toxicolgico o una sala de urgencia si usted cree que haya tomado demasiado medicamento. ATENCIN: [employer] es solo para usted. No comparta este medicamento con nadie. Qu sucede si me olvido de una dosis? Si olvida una dosis, tmela lo antes posible. Si es casi la hora de la prxima dosis, tome slo esa dosis. No tome dosis adicionales o dobles. Qu puede interactuar con este medicamento? No use este medicamento con ninguno de los siguientes frmacos: cisaprida dofetilida dronedarona linezolida IMAO, tales como Carbex, Eldepryl, Marplan, [place] y Parnate azul de metileno (inyectado en una vena) pimozida tioridazina Este medicamento tambin puede Product/process development scientist con los siguientes frmacos: alcohol antihistamnicos para Programmer, multimedia, tos y resfro atropina ciertos medicamentos para problemas de vejiga, tales como oxibutinina y tolterodina ciertos medicamentos para la depresin, tales como amitriptilina, fluoxetina, sertralina ciertos medicamentos para el mal de Parkinson, tales como benzatropina y trihexifenidilo ciertos medicamentos para Dietitian, tales como diciclomina e hiosciamina ciertos medicamentos para el mareo por movimiento, como escopolamina clorpropamida cimetidina ipratropio otros medicamentos que prolongan el intervalo QT (un ritmo cardiaco anormal) otros medicamentos que pueden causar sndrome serotoninrgico (de la serotonina), tales como  hierba de [place],  [place], litio, tramadol, triptfano, buspirona y algunos medicamentos para el dolor de cabeza como sumatriptn o rizatriptn quinidina reserpina medicamento para la tiroides Puede ser que esta lista no menciona todas las posibles interacciones. Informe a su profesional de [employer] de Ingram Micro Inc productos a base de hierbas, medicamentos de [place] o suplementos nutritivos que est tomando. Si usted fuma, consume bebidas alcohlicas o si utiliza drogas ilegales, indqueselo tambin a su profesional de [employer]. Algunas sustancias pueden interactuar con su medicamento. A qu debo estar atento al usar [employer] Corporation? Informe a su mdico si sus sntomas no mejoran o si empeoran. Visite a su mdico o a su profesional de la salud para chequear su evolucin peridicamente. Debido que puede ser necesario tomar este medicamento durante varias semanas para que sea posible observar sus efectos en forma [place], es importante que sigue su tratamiento como recetado por su mdico. Los pacientes y sus familias deben estar atentos si empeora la depresin o ideas suicidas. Tambin est atento a cambios repentinos o severos de emocin, tales como el sentirse ansioso, agitado, lleno de pnico, irritable, hostil, agresivo, impulsivo, inquietud severa, demasiado excitado y hiperactivo o dificultad para conciliar el sueo. Si esto ocurre, especialmente al comenzar con un tratamiento antidepresivo o al cambiar de dosis, comunquese con su profesional de [employer]. Puede experimentar somnolencia o mareos. No conduzca ni utilice maquinaria ni haga nada que Scientist, research (life sciences) en estado de alerta hasta que sepa cmo le afecta este medicamento. No se siente ni se ponga de pie con rapidez, especialmente si es un paciente de edad avanzada. Esto reduce el riesgo de mareos o [employer]. El alcohol puede interferir con el efecto de [place]. Evite consumir bebidas alcohlicas. No se trate usted mismo si tiene tos, resfro o  Environmental consultant sin Science writer con su mdico o con su profesional de [employer]. Algunos ingredientes pueden aumentar los posibles efectos secundarios. Se le podr secar la boca. Masticar chicle sin azcar, chupar caramelos duros y tomar agua en abundancia lo ayudar a mantener la boca hmeda. Si el problema no desaparece o es severo, consulte a su mdico. Este medicamento puede resecarle los ojos y provocar visin borrosa. Si [place] lentes de contacto, puede sentir ciertas molestias. Las gotas lubricantes pueden ser tiles. Si el problema no desaparece o es severo, consulte con su mdico de los ojos. Este medicamento causar estreimiento. Trate de evacuar los intestinos al menos cada 2  3 das. Si no evacua los intestinos durante [address]  [address], comunquese con su mdico o con su profesional de [employer]. Este medicamento puede aumentar la sensibilidad al sol. Mantngase fuera de Secretary/administrator. Si no lo puede evitar, utilice ropa protectora y crema de Orthoptist. No utilice lmparas solares, camas solares ni cabinas solares. Qu efectos secundarios puedo tener al [employer] este medicamento? Efectos secundarios que debe informar a su mdico o a Producer, television/film/video de la salud tan pronto como sea posible: Therapist, art, como erupcin cutnea, comezn/picazn o urticarias, e hinchazn de la cara, los labios o la lengua ansiedad problemas respiratorios cambios en la visin confusin estado de nimo elevado, menor necesidad de dormir, pensamientos acelerados, conducta impulsiva dolor ocular ritmo cardiaco rpido, irregular sensacin de desmayos o aturdimiento, cadas sensacin de agitacin, enojo o irritabilidad fiebre con aumento de la sudoracin alucinaciones, prdida del contacto con la realidad convulsiones rigidez de los msculos ideas suicidas u otros cambios en el estado de nimo hormigueo, Engineer, mining o entumecimiento de los pies  o las manos dificultad para orinar o cambios en el volumen de orina dificultad para conciliar  el sueo cansancio o debilidad inusual vmito color amarillento de los ojos o la piel Efectos secundarios que generalmente no requieren atencin mdica (infrmelos a su mdico o a Producer, television/film/videosu profesional de la salud si persisten o si son molestos): cambios en el deseo o desempeo sexual cambios en el apetito o el peso estreimiento mareos boca seca nuseas cansancio temblores Programme researcher, broadcasting/film/videomalestar estomacal Puede ser que esta lista no menciona todos los posibles efectos secundarios. Comunquese a su mdico por asesoramiento mdico Hewlett-Packardsobre los efectos secundarios. Usted puede informar los efectos secundarios a la FDA por telfono al 1-800-FDA-1088. Dnde debo guardar mi medicina? Mantngala fuera del alcance de los nios. Gurdela a Sanmina-SCItemperatura ambiente, entre 15 y 30 grados C (4459 y 8486 grados F). Mantenga el envase bien cerrado. Deseche todo el medicamento que no haya utilizado, despus de la fecha de vencimiento. ATENCIN: Este folleto es un resumen. Puede ser que no cubra toda la posible informacin. Si usted tiene preguntas acerca de esta medicina, consulte con su mdico, su farmacutico o su profesional de Radiographer, therapeuticla salud.  2019 Elsevier/Gold Standard (2018-03-17 00:00:00)

## 2018-11-18 NOTE — Progress Notes (Signed)
[facility]    Provider:  Dr Lucia GaskinsAhern Referring Provider: Sallye Lathristopher Groat, MD, Loletta SpecterGomez, Roger David, PA-C  Primary Care Physician:  Loletta SpecterGomez, Roger David, PA-C  CC:  Dizziness and Headache  HPI: This is a 63 year old patient here from Dr. Dione BoozeGroat and Loletta SpecterGomez, Roger David, PA-C for visual disturbance and non intractable headache.  She was last seen in 2016 here in our office and diagnosed with chronic tension type headache.  She has a past medical history of chronic headaches, coronary artery disease, hypertension,.  Recently evaluated for temporal arteritis due to vision changes which was negative biopsy.  She also has a past medical history of fibromyalgia, arthritis, glaucoma. Patient the biopsy was negative.  She was sent to ophthalmology.  The headache in on the top of her head. Laying own and closing eyes and turning off the lights help. It is pounding. The light bothers her. Movement makes it worse. Nausea. She feels dizzy and lightheaded. She feels she is going to faint.  She denies any vision loss and she says her vision is much better since she got new glasses no more vision complaints since getting the new glasses. She has side effects to the Topamax. She is stil having insomnia. She has the migraines 3-4 days a week. Mo medication overuse.   Meds tried: Topamax, cymbalta, gabapentin, losartan, zofran, compazine, elavil, lexapro,   CT head 2013 showed No acute intracranial abnormalities including mass lesion or mass effect, hydrocephalus, extra-axial fluid collection, midline shift, hemorrhage, or acute infarction, large ischemic events (personally reviewed images)   I reviewed the charts patient was seen by vascular surgeon Dr. Myra GianottiBrabham in December 2019 for evaluation of temporal arteritis.  She has a long history of pain in her right scalp as well as pain that goes down her right arm.  A month prior she started having short episodes of visual loss lasting about 3 seconds.   Temporal artery biopsy was requested.  She does have a history of coronary artery disease. Patient the biopsy was negative.  She was sent to ophthalmology.    CMP with elevated glucose  HPI 08/15/2015 :  Amy EdelsonLucina Cline is a 63 y.o. female here as a referral from Dr. Mariel AloeGomezfor dizziness and headache. Past medical history of hypertension, anxiety, fibromyalgia. She is non-english speaking and is here with an interpreter. Symptoms started 2 months ago. No inciting factors, no trauma, no new medication. She has been having a lot of stress, she has depression after losing her son at the age of 65 in an accident. She has been thinking of that recently. She is teary in the office. She has had headaches for a long time. She has been having headaches for 14 years. She has a history of depression. Then the headaches started. The headaches are all over th ehead, pressure, throbbing, on both sides of the head. No light sensitivity or sound sensitivity. She has sensitivity in the scalp, she feels a burning sensation on the top of her head. No nausea or vomiting. The headaches are 4 days a week. She takes alleve for the headaches, not every day. No vision changes, no speech changes, no hearing changes. The pain can be severe, worsening. She gets blurry vision and dizziness with the headaches. Symptoms have been gong on for 12 years but they are worsening recently. Amitriptyline in the past 25mg  helped her a lot. No cardiac problems. She has insomnia.  Ct of the head 07/2012 showed No acute intracranial abnormalities including mass  lesion or mass effect, hydrocephalus, extra-axial fluid collection, midline shift, hemorrhage, or acute infarction, large ischemic events (personally reviewed images)   Review of Systems: Patient complains of symptoms per HPI as well as the following symptoms:Ringing in ears, light sensitivity, blurred vision, dizziness, headache, weakness, decreased concentration, joint pain, back pain, neck  pain . Pertinent negatives per HPI. All others negative.   Social History   Socioeconomic History  . Marital status: Legally Separated    Spouse name: Maisie Fus  . Number of children: 2  . Years of education: 40  . Highest education level: Not on file  Occupational History  . Occupation: Retired  Engineer, production  . Financial resource strain: Not on file  . Food insecurity:    Worry: Not on file    Inability: Not on file  . Transportation needs:    Medical: Not on file    Non-medical: Not on file  Tobacco Use  . Smoking status: Never Smoker  . Smokeless tobacco: Never Used  Substance and Sexual Activity  . Alcohol use: Not Currently    Comment: SOCIAL  . Drug use: No  . Sexual activity: Yes    Birth control/protection: Surgical  Lifestyle  . Physical activity:    Days per week: Not on file    Minutes per session: Not on file  . Stress: Not on file  Relationships  . Social connections:    Talks on phone: Not on file    Gets together: Not on file    Attends religious service: Not on file    Active member of club or organization: Not on file    Attends meetings of clubs or organizations: Not on file    Relationship status: Not on file  . Intimate partner violence:    Fear of current or ex partner: Not on file    Emotionally abused: Not on file    Physically abused: Not on file    Forced sexual activity: Not on file  Other Topics Concern  . Not on file  Social History Narrative   Lives at home alone   Caffeine use: 1-2 cups per day    Family History  Problem Relation Age of Onset  . Hypertension Mother   . Hypertension Father   . Migraines Neg Hx     Past Medical History:  Diagnosis Date  . Arthritis    Bil hands  . Bladder prolapse, female, acquired   . Breast mass    left  . Chronic headaches   . Coronary artery disease   . Fibromyalgia   . GERD (gastroesophageal reflux disease)   . Glaucoma   . Hyperlipemia   . Hypertension   . Insomnia   .  Prediabetes   . Vitamin D deficiency     Past Surgical History:  Procedure Laterality Date  . ABDOMINAL HYSTERECTOMY  2008  . APPENDECTOMY    . ARTERY BIOPSY Right 09/16/2018   Procedure: BIOPSY TEMPORAL ARTERY RIGHT;  Surgeon: Maeola Harman, MD;  [facility]: [facility];  Service: Vascular;  Laterality: Right;  . BREAST BIOPSY     benign/left breast  . COLONOSCOPY  07/2018    Current Outpatient Medications  Medication Sig Dispense Refill  . alendronate (FOSAMAX) 35 MG tablet Take 1 tablet (35 mg total) by mouth every 7 (seven) days. Take with a full glass of water on an empty stomach. 4 tablet 11  . atorvastatin (LIPITOR) 20 MG tablet TAKE 1 TABLET BY MOUTH EVERY DAY 90 tablet  1  . B Complex-C (SUPER B COMPLEX PO) Take by mouth daily at 6 (six) AM.    . carboxymethylcellulose (REFRESH PLUS) 0.5 % SOLN 1 drop as needed.    . Cholecalciferol (VITAMIN D-3) 5000 units TABS Take by mouth daily at 6 (six) AM.    . DULoxetine (CYMBALTA) 60 MG capsule TAKE 1 CAPSULE BY MOUTH EVERY DAY 90 capsule 2  . gabapentin (NEURONTIN) 300 MG capsule Take 1 capsule (300 mg total) by mouth 3 (three) times daily as needed. 270 capsule 3  . hydrochlorothiazide (HYDRODIURIL) 12.5 MG tablet Take 1 tablet (12.5 mg total) by mouth daily. 30 tablet 5  . losartan (COZAAR) 50 MG tablet Take 1 tablet (50 mg total) by mouth daily. 30 tablet 5  . omeprazole (PRILOSEC) 20 MG capsule TAKE 1 CAPSULE BY MOUTH EVERY DAY 90 capsule 1  . triamcinolone cream (KENALOG) 0.1 % Apply 1 application topically 2 (two) times daily. (Patient taking differently: Apply 1 application topically as needed. ) 30 g 0  . nortriptyline (PAMELOR) 25 MG capsule Take 1 capsule (25 mg total) by mouth at bedtime. For migraine prevention. 30 capsule 3  . ondansetron (ZOFRAN-ODT) 4 MG disintegrating tablet Take 1 tablet (4 mg total) by mouth every 8 (eight) hours as needed for nausea. 30 tablet 3  . SUMAtriptan (IMITREX) 100 MG tablet Take 1 tablet  (100 mg total) by mouth once as needed for up to 1 dose. May repeat in 2 hours if headache persists or recurs. 10 tablet 12   Current Facility-Administered Medications  Medication Dose Route Frequency Provider Last Rate Last Dose  . 0.9 %  sodium chloride infusion  500 mL Intravenous Once Meryl Dare, MD        Allergies as of 11/18/2018  . (No Known Allergies)    Vitals: BP 130/74 (BP Location: Right Arm, Patient Position: Sitting)   Pulse 84   Ht 5\' 3"  (1.6 m)   Wt 148 lb (67.1 kg)   BMI 26.22 kg/m  Last Weight:  Wt Readings from Last 1 Encounters:  11/18/18 148 lb (67.1 kg)   Last Height:   Ht Readings from Last 1 Encounters:  11/18/18 5\' 3"  (1.6 m)   Physical exam: Exam: Gen: NAD, conversant, well nourised, obese, well groomed                     CV: RRR, no MRG. No Carotid Bruits. No peripheral edema, warm, nontender Eyes: Conjunctivae clear without exudates or hemorrhage  Neuro: Detailed Neurologic Exam  Speech:    Speech is normal; fluent and spontaneous with normal comprehension.  Cognition:    The patient is oriented to person, place, and time;     recent and remote memory intact;     language fluent;     normal attention, concentration,     fund of knowledge Cranial Nerves:    The pupils are equal, round, and reactive to light. The fundi are normal and spontaneous venous pulsations are present. Visual fields are full to finger confrontation. Extraocular movements are intact. Trigeminal sensation is intact and the muscles of mastication are normal. The face is symmetric. The palate elevates in the midline. Hearing intact. Voice is normal. Shoulder shrug is normal. The tongue has normal motion without fasciculations.   Coordination:    Normal finger to nose and heel to shin. Normal rapid alternating movements.   Gait:    Heel-toe and tandem gait are normal.   Motor Observation:  No asymmetry, no atrophy, and no involuntary movements noted. Tone:     Normal muscle tone.    Posture:    Posture is normal. normal erect    Strength:    Strength is V/V in the upper and lower limbs.      Sensation: intact to LT     Reflex Exam:  DTR's:    Deep tendon reflexes in the upper and lower extremities are normal bilaterally.   Toes:    The toes are downgoing bilaterally.   Clonus:    Clonus is absent.  Assessment/Plan:  63 year old with chronic migraines  Declined MRI of the brain and MRA of the head she is uninsured. Will help her get [facility] Financial Assistance so she can see [place] regularly and get imaging of the brain and possibly try the new CGRP injection if we can get [facility] to cover her meds as well  Start Nortriptyline for headaches Nortriptyline may help with insomnia as well Acute management: Sumatriptan  Discussed: To prevent or relieve headaches, try the following: Cool Compress. Lie down and place a cool compress on your head.  Avoid headache triggers. If certain foods or odors seem to have triggered your migraines in the past, avoid them. A headache diary might help you identify triggers.  Include physical activity in your daily routine. Try a daily walk or other moderate aerobic exercise.  Manage stress. Find healthy ways to cope with the stressors, such as delegating tasks on your to-do list.  Practice relaxation techniques. Try deep breathing, yoga, massage and visualization.  Eat regularly. Eating regularly scheduled meals and maintaining a healthy diet might help prevent headaches. Also, drink plenty of fluids.  Follow a regular sleep schedule. Sleep deprivation might contribute to headaches Consider biofeedback. With this mind-body technique, you learn to control certain bodily functions - such as muscle tension, heart rate and blood pressure - to prevent headaches or reduce headache pain.    Proceed to emergency room if you experience new or worsening symptoms or symptoms do not resolve, if you have new neurologic symptoms  or if headache is severe, or for any concerning symptom.   Provided education and documentation from American headache Society toolbox including articles on: chronic migraine medication overuse headache, chronic migraines, prevention of migraines, behavioral and other nonpharmacologic treatments for headache.  Cc: Sallye Lat, MD, Loletta Specter, PA-C   Naomie Dean, MD  [facility] [address] [place], [place] [address]  Phone [phone] Fax [phone]

## 2018-12-14 ENCOUNTER — Other Ambulatory Visit: Payer: Self-pay | Admitting: Neurology

## 2018-12-22 ENCOUNTER — Ambulatory Visit (INDEPENDENT_AMBULATORY_CARE_PROVIDER_SITE_OTHER): Payer: Self-pay | Admitting: Primary Care

## 2018-12-22 ENCOUNTER — Other Ambulatory Visit: Payer: Self-pay

## 2018-12-22 ENCOUNTER — Encounter (INDEPENDENT_AMBULATORY_CARE_PROVIDER_SITE_OTHER): Payer: Self-pay | Admitting: Primary Care

## 2018-12-22 VITALS — BP 143/94 | HR 81 | Temp 98.1°F | Wt 149.4 lb

## 2018-12-22 DIAGNOSIS — G894 Chronic pain syndrome: Secondary | ICD-10-CM

## 2018-12-22 DIAGNOSIS — I10 Essential (primary) hypertension: Secondary | ICD-10-CM

## 2018-12-22 DIAGNOSIS — M797 Fibromyalgia: Secondary | ICD-10-CM

## 2018-12-22 DIAGNOSIS — E785 Hyperlipidemia, unspecified: Secondary | ICD-10-CM

## 2018-12-22 DIAGNOSIS — J069 Acute upper respiratory infection, unspecified: Secondary | ICD-10-CM

## 2018-12-22 MED ORDER — LOSARTAN POTASSIUM 50 MG PO TABS: 50.0000 mg | ORAL_TABLET | Freq: Every day | ORAL | 5 refills | Status: DC

## 2018-12-22 MED ORDER — GABAPENTIN 300 MG PO CAPS: 300.0000 mg | ORAL_CAPSULE | Freq: Three times a day (TID) | ORAL | 3 refills | Status: DC

## 2018-12-22 MED ORDER — ALENDRONATE SODIUM 35 MG PO TABS: 35.0000 mg | ORAL_TABLET | ORAL | 11 refills | Status: DC

## 2018-12-22 MED ORDER — HYDROCHLOROTHIAZIDE 12.5 MG PO TABS: 12.5000 mg | ORAL_TABLET | Freq: Every day | ORAL | 5 refills | Status: DC

## 2018-12-22 MED ORDER — DULOXETINE HCL 60 MG PO CPEP: ORAL_CAPSULE | ORAL | 3 refills | Status: DC

## 2018-12-22 MED ORDER — ATORVASTATIN CALCIUM 20 MG PO TABS: 20.0000 mg | ORAL_TABLET | Freq: Every day | ORAL | 1 refills | Status: DC

## 2018-12-22 NOTE — Progress Notes (Signed)
Acute Office Visit  Subjective:    Patient ID: Amy Cline, female    DOB: 10-15-1956, 63 y.o.   MRN: [id number]  Chief Complaint  Patient presents with  . URI    headaches     HPI Patient is in today for acute she has had and continues to have fever with chill, headache, body ache and productive cough.  Past Medical History:  Diagnosis Date  . Arthritis    Bil hands  . Bladder prolapse, female, acquired   . Breast mass    left  . Chronic headaches   . Coronary artery disease   . Fibromyalgia   . GERD (gastroesophageal reflux disease)   . Glaucoma   . Hyperlipemia   . Hypertension   . Insomnia   . Prediabetes   . Vitamin D deficiency     Past Surgical History:  Procedure Laterality Date  . ABDOMINAL HYSTERECTOMY  2008  . APPENDECTOMY    . ARTERY BIOPSY Right 09/16/2018   Procedure: BIOPSY TEMPORAL ARTERY RIGHT;  Surgeon: Maeola Harman, MD;  Location: [facility] OR;  Service: Vascular;  Laterality: Right;  . BREAST BIOPSY     benign/left breast  . COLONOSCOPY  07/2018    Family History  Problem Relation Age of Onset  . Hypertension Mother   . Hypertension Father   . Migraines Neg Hx     Social History   Socioeconomic History  . Marital status: Legally Separated    Spouse name: Maisie Fus  . Number of children: 2  . Years of education: 66  . Highest education level: Not on file  Occupational History  . Occupation: Retired  Engineer, production  . Financial resource strain: Not on file  . Food insecurity:    Worry: Not on file    Inability: Not on file  . Transportation needs:    Medical: Not on file    Non-medical: Not on file  Tobacco Use  . Smoking status: Never Smoker  . Smokeless tobacco: Never Used  Substance and Sexual Activity  . Alcohol use: Not Currently    Comment: SOCIAL  . Drug use: No  . Sexual activity: Yes    Birth control/protection: Surgical  Lifestyle  . Physical activity:    Days per week: Not on file    Minutes per  session: Not on file  . Stress: Not on file  Relationships  . Social connections:    Talks on phone: Not on file    Gets together: Not on file    Attends religious service: Not on file    Active member of club or organization: Not on file    Attends meetings of clubs or organizations: Not on file    Relationship status: Not on file  . Intimate partner violence:    Fear of current or ex partner: Not on file    Emotionally abused: Not on file    Physically abused: Not on file    Forced sexual activity: Not on file  Other Topics Concern  . Not on file  Social History Narrative   Lives at home alone   Caffeine use: 1-2 cups per day    Outpatient Medications Prior to Visit  Medication Sig Dispense Refill  . alendronate (FOSAMAX) 35 MG tablet Take 1 tablet (35 mg total) by mouth every 7 (seven) days. Take with a full glass of water on an empty stomach. 4 tablet 11  . atorvastatin (LIPITOR) 20 MG tablet TAKE 1 TABLET BY MOUTH  EVERY DAY 90 tablet 1  . B Complex-C (SUPER B COMPLEX PO) Take by mouth daily at 6 (six) AM.    . Cholecalciferol (VITAMIN D-3) 5000 units TABS Take by mouth daily at 6 (six) AM.    . DULoxetine (CYMBALTA) 60 MG capsule TAKE 1 CAPSULE BY MOUTH EVERY DAY 90 capsule 2  . gabapentin (NEURONTIN) 300 MG capsule Take 1 capsule (300 mg total) by mouth 3 (three) times daily as needed. 270 capsule 3  . nortriptyline (PAMELOR) 25 MG capsule TAKE 1 CAPSULE (25 MG TOTAL) BY MOUTH AT BEDTIME. FOR MIGRAINE PREVENTION. 90 capsule 2  . omeprazole (PRILOSEC) 20 MG capsule TAKE 1 CAPSULE BY MOUTH EVERY DAY 90 capsule 1  . ondansetron (ZOFRAN-ODT) 4 MG disintegrating tablet Take 1 tablet (4 mg total) by mouth every 8 (eight) hours as needed for nausea. 30 tablet 3  . SUMAtriptan (IMITREX) 100 MG tablet Take 1 tablet (100 mg total) by mouth once as needed for up to 1 dose. May repeat in 2 hours if headache persists or recurs. 10 tablet 12  . carboxymethylcellulose (REFRESH PLUS) 0.5 %  SOLN 1 drop as needed.    . hydrochlorothiazide (HYDRODIURIL) 12.5 MG tablet Take 1 tablet (12.5 mg total) by mouth daily. (Patient not taking: Reported on 12/22/2018) 30 tablet 5  . losartan (COZAAR) 50 MG tablet Take 1 tablet (50 mg total) by mouth daily. (Patient not taking: Reported on 12/22/2018) 30 tablet 5  . triamcinolone cream (KENALOG) 0.1 % Apply 1 application topically 2 (two) times daily. (Patient taking differently: Apply 1 application topically as needed. ) 30 g 0   Facility-Administered Medications Prior to Visit  Medication Dose Route Frequency Provider Last Rate Last Dose  . 0.9 %  sodium chloride infusion  500 mL Intravenous Once Meryl Dare, MD        No Known Allergies  Review of Systems  Constitutional: Positive for chills and fever.  HENT: Positive for congestion.   Eyes: Negative.   Respiratory: Positive for cough, sputum production and shortness of breath.   Cardiovascular: Negative.   Gastrointestinal: Positive for diarrhea.  Genitourinary: Negative.   Musculoskeletal: Positive for myalgias.  Neurological: Negative.   Endo/Heme/Allergies: Negative.   Psychiatric/Behavioral: Negative.        Objective:    Physical Exam  Constitutional: She is oriented to person, place, and time. She appears well-developed and well-nourished.  HENT:  Head: Normocephalic.  Neck: Normal range of motion.  Cardiovascular: Normal rate and regular rhythm.  Pulmonary/Chest: Effort normal and breath sounds normal.  Abdominal: Soft. Bowel sounds are normal.  Musculoskeletal: Normal range of motion.  Neurological: She is alert and oriented to person, place, and time.  Skin: Skin is warm and dry.  Psychiatric: She has a normal mood and affect.    BP (!) 143/94 (BP Location: Right Arm, Patient Position: Sitting, Cuff Size: Normal)   Pulse 81   Temp 98.1 F (36.7 C) (Oral)   Wt 149 lb 6.4 oz (67.8 kg)   SpO2 96%   BMI 26.47 kg/m  Wt Readings from Last 3 Encounters:   12/22/18 149 lb 6.4 oz (67.8 kg)  11/18/18 148 lb (67.1 kg)  09/24/18 145 lb (65.8 kg)    Health Maintenance Due  Topic Date Due  . PAP SMEAR-Modifier  07/12/1975  . INFLUENZA VACCINE  05/15/2018    There are no preventive care reminders to display for this patient.   Lab Results  Component Value Date   TSH  2.660 08/09/2017   Lab Results  Component Value Date   WBC 5.3 09/16/2018   HGB 12.4 09/16/2018   HCT 39.6 09/16/2018   MCV 96.1 09/16/2018   PLT 269 09/16/2018   Lab Results  Component Value Date   NA 134 (L) 09/16/2018   K 3.9 09/16/2018   CO2 23 09/16/2018   GLUCOSE 103 (H) 09/16/2018   BUN 17 09/16/2018   CREATININE 0.79 09/16/2018   BILITOT 0.8 09/16/2018   ALKPHOS 66 09/16/2018   AST 24 09/16/2018   ALT 19 09/16/2018   PROT 6.5 09/16/2018   ALBUMIN 3.5 09/16/2018   CALCIUM 8.4 (L) 09/16/2018   ANIONGAP 10 09/16/2018   Lab Results  Component Value Date   CHOL 206 (H) 08/09/2017   Lab Results  Component Value Date   HDL 52 08/09/2017   Lab Results  Component Value Date   LDLCALC 111 (H) 08/09/2017   Lab Results  Component Value Date   TRIG 217 (H) 08/09/2017   Lab Results  Component Value Date   CHOLHDL 4.0 08/09/2017   No results found for: HGBA1C     Assessment & Plan:   Problem List Items Addressed This Visit    None    1. Essential hypertension Elevated at this visit not feeling well. Cont current Bp meds will re-eval f/u visit   2. Acute upper respiratory infection tx s/s fever chills tylenol , ibuprofen, cough OTC cough meds  3. Hyperlipidemia, unspecified hyperlipidemia type On atorvastatin on routine visit obtain FLP  - atorvastatin (LIPITOR) 20 MG tablet; Take 1 tablet (20 mg total) by mouth daily.  Dispense: 90 tablet; Refill: 1  4. Fibromyalgia - DULoxetine (CYMBALTA) 60 MG capsule; TAKE 1 CAPSULE BY MOUTH EVERY DAY  Dispense: 30 capsule; Refill: 3  5. Chronic pain syndrome - gabapentin (NEURONTIN) 300 MG  capsule; Take 1 capsule (300 mg total) by mouth 3 (three) times daily.  Dispense: 270 capsule; Refill: 3  6. Hypertension, unspecified type  - hydrochlorothiazide (HYDRODIURIL) 12.5 MG tablet; Take 1 tablet (12.5 mg total) by mouth daily.  Dispense: 30 tablet; Refill: 5 - losartan (COZAAR) 50 MG tablet; Take 1 tablet (50 mg total) by mouth daily.  Dispense: 30 tablet; Refill: 5  No orders of the defined types were placed in this encounter.    Grayce Sessions, NP

## 2018-12-22 NOTE — Patient Instructions (Signed)
Infeccin de las vas respiratorias superiores, en adultos  Upper Respiratory Infection, Adult  Una infeccin de las vas respiratorias superiores (IVRS) afecta la nariz, la garganta y las vas respiratorias superiores. Las IVRS son causadas por microbios (virus). El tipo ms comn de IVRS es el resfro comn.  Las IVRS no se curan con medicamentos, pero hay ciertas cosas que puede hacer en su casa para aliviar los sntomas. Una IVRS suele mejorar en el transcurso de 7 a 10 das.  Siga estas indicaciones en su casa:  Actividad   Descanse todo lo que sea necesario.   Si tiene fiebre, permanezca en su casa, sin ir al trabajo o a la escuela, hasta que ya no tenga fiebre, o hasta que el mdico le indique que puede regresar al trabajo o a la escuela.  ? Debe permanecer en su casa hasta que ya no pueda propagar (contagiar) la infeccin.  ? Es posible que el mdico le indique que use una mascarilla para tener menos riesgo de propagar la infeccin.  Para aliviar los sntomas   Haga grgaras con una mezcla de agua y sal 3 o 4veces al da, o cuando sea necesario. Para preparar la mezcla de agua y sal, disuelva totalmente de media a 1cucharadita de sal en 1taza de agua tibia.   Use un humidificador de aire fro para agregar humedad al aire. Esto puede ayudarlo a que respire mejor.  Qu debe comer y beber     Beba suficiente lquido para mantener la orina de color amarillo plido.   Tome sopas y caldos transparentes.  Instrucciones generales     Tome los medicamentos de venta libre y los recetados solamente como se lo haya indicado el mdico. Estos incluyen medicamentos para el resfro, para bajar la fiebre y antitusivos.   No consuma ningn producto que contenga nicotina o tabaco. Esto incluye cigarrillos y cigarrillos electrnicos. Si necesita ayuda para dejar de fumar, consulte al mdico.   Evite estar cerca de personas que fuman (evite el humo ambiental de tabaco).   Asegrese de vacunarse regularmente y  recibir la vacuna contra la gripe todos los aos.   Concurra a todas las visitas de seguimiento como se lo haya indicado el mdico. Esto es importante.  Cmo evitar la propagacin de la infeccin a otras personas     Lvese las manos frecuentemente con agua y jabn. Use un desinfectante para manos si no dispone de agua y jabn.   Evite tocarse la boca, la cara, los ojos o la nariz.   Tosa o estornude en un pauelo de papel o sobre su manga o codo. No tosa o estornude al aire ni se cubra la boca o la nariz con la mano.  Comunquese con un mdico si:   Siente que empeora o que no mejora.   Tiene alguno de estos sntomas:  ? Fiebre.  ? Escalofros.  ? Mucosidad color marrn o roja en la nariz.  ? Lquido amarillento o amarronado (secrecin)que le sale de la nariz.  ? Dolor en la cara, especialmente al inclinarse hacia adelante.  ? Ganglios del cuello inflamados.  ? Dolor al tragar.  ? Zonas blancas en la parte de atrs de la garganta.  Solicite ayuda de inmediato si:   La falta de aire empeora.   Los siguientes sntomas son muy intensos o constantes:  ? Dolor de cabeza.  ? Dolor de odo.  ? Dolor en la frente, detrs de los ojos y por encima de los pmulos (dolor sinusal).  ?   Dolor en el pecho.   Tiene una enfermedad pulmonar prolongada (crnica) junto con cualquiera de estos sntomas:  ? Sibilancias.  ? Tos prolongada.  ? Tos con sangre.  ? Cambio en la mucosidad habitual.   Presenta rigidez en el cuello.   Tiene cambios en:  ? La visin.  ? La audicin.  ? El razonamiento.  ? El estado de nimo.  Resumen   Una infeccin de las vas respiratorias superiores (IVRS) es causada por un microbio llamado virus. El tipo ms comn de IVRS es el resfro comn.   Una IVRS suele mejorar en el transcurso de 7 a 10 das.   Tome los medicamentos de venta libre y los recetados solamente como se lo haya indicado el mdico.  Esta informacin no tiene como fin reemplazar el consejo del mdico. Asegrese de hacerle al  mdico cualquier pregunta que tenga.  Document Released: 03/05/2011 Document Revised: 08/02/2017 Document Reviewed: 08/02/2017  Elsevier Interactive Patient Education  2019 [employer]

## 2018-12-25 ENCOUNTER — Telehealth (INDEPENDENT_AMBULATORY_CARE_PROVIDER_SITE_OTHER): Payer: Self-pay | Admitting: Primary Care

## 2018-12-25 NOTE — Telephone Encounter (Signed)
Patients daughter also called wanting to know what OTC cough medication is safe for patient to take. Please address this along with patients other concern in previous phone note. Maryjean Morn, CMA

## 2018-12-25 NOTE — Telephone Encounter (Signed)
Patient called and says her lips are cracked and has tried to put vaseline on it as well as washing it with water. Patient states she is experiencing fever and believes this is what is causing the cracked lips and would like for something to be called in for her lips. Please follow up.

## 2018-12-29 NOTE — Telephone Encounter (Signed)
Cracked lips can indicate dry lips recc 64 oz QD. Check with pharmacist for OTC cough medication they are able to run any interactions with present meds

## 2018-12-31 ENCOUNTER — Ambulatory Visit: Payer: Self-pay

## 2019-01-02 NOTE — Telephone Encounter (Signed)
Patient is aware to drink water and ask pharmacist about OTC cough medicine that will not interact with her other medications. Amy Cline, CMA

## 2019-01-05 ENCOUNTER — Ambulatory Visit (INDEPENDENT_AMBULATORY_CARE_PROVIDER_SITE_OTHER): Payer: Self-pay

## 2019-02-02 ENCOUNTER — Ambulatory Visit (INDEPENDENT_AMBULATORY_CARE_PROVIDER_SITE_OTHER): Payer: Self-pay

## 2019-02-13 ENCOUNTER — Other Ambulatory Visit: Payer: Self-pay | Admitting: Pharmacist

## 2019-02-13 DIAGNOSIS — G894 Chronic pain syndrome: Secondary | ICD-10-CM

## 2019-02-13 MED ORDER — OMEPRAZOLE 20 MG PO CPDR: DELAYED_RELEASE_CAPSULE | ORAL | 0 refills | Status: DC

## 2019-02-16 ENCOUNTER — Ambulatory Visit: Payer: Self-pay | Admitting: Family Medicine

## 2019-03-23 ENCOUNTER — Other Ambulatory Visit: Payer: Self-pay | Admitting: Nurse Practitioner

## 2019-03-23 DIAGNOSIS — R5381 Other malaise: Secondary | ICD-10-CM

## 2019-03-26 ENCOUNTER — Other Ambulatory Visit: Payer: Self-pay

## 2019-03-26 ENCOUNTER — Ambulatory Visit: Payer: Self-pay | Attending: Primary Care | Admitting: Primary Care

## 2019-03-26 ENCOUNTER — Encounter: Payer: Self-pay | Admitting: Primary Care

## 2019-03-26 DIAGNOSIS — M797 Fibromyalgia: Secondary | ICD-10-CM

## 2019-03-26 DIAGNOSIS — M79601 Pain in right arm: Secondary | ICD-10-CM

## 2019-03-26 DIAGNOSIS — I10 Essential (primary) hypertension: Secondary | ICD-10-CM

## 2019-03-26 DIAGNOSIS — G894 Chronic pain syndrome: Secondary | ICD-10-CM

## 2019-03-26 MED ORDER — GABAPENTIN 300 MG PO CAPS: 300.0000 mg | ORAL_CAPSULE | Freq: Three times a day (TID) | ORAL | 3 refills | Status: DC

## 2019-03-26 MED ORDER — HYDROCHLOROTHIAZIDE 12.5 MG PO TABS: 12.5000 mg | ORAL_TABLET | Freq: Every day | ORAL | 3 refills | Status: DC

## 2019-03-26 MED ORDER — LOSARTAN POTASSIUM 50 MG PO TABS: 50.0000 mg | ORAL_TABLET | Freq: Every day | ORAL | 3 refills | Status: DC

## 2019-03-26 MED ORDER — DULOXETINE HCL 60 MG PO CPEP: ORAL_CAPSULE | ORAL | 3 refills | Status: DC

## 2019-03-26 NOTE — Progress Notes (Signed)
Virtual Visit via Telephone Note  I connected with Amy Cline on 03/26/19 at  9:10 AM EDT by telephone and verified that I am speaking with the correct person using two identifiers.   I discussed the limitations, risks, security and privacy concerns of performing an evaluation and management service by telephone and the availability of in person appointments. I also discussed with the patient that there may be a patient responsible charge related to this service. The patient expressed understanding and agreed to proceed.   History of Present Illness: [name] initial visit was to be referred to GYN to schedule for a well woman physical explain via interpretor that can be done in the office appt made. She also needed medication refills for chronic conditions.   Observations/Objective: Review of Systems  Constitutional: Negative.   HENT: Negative.   Eyes: Negative.        Recently had ophthalmology visit and new glasses  Respiratory: Negative.   Cardiovascular: Negative.   Gastrointestinal: Positive for heartburn.  Genitourinary: Positive for dysuria.       Smells strong drinks 5 bottles of H2O daily  Musculoskeletal: Negative.        Shoulder pain right side  Skin: Negative.   Neurological: Positive for headaches.       Uses Aleve   Endo/Heme/Allergies: Negative.   Psychiatric/Behavioral: Positive for depression. The patient has insomnia.     Assessment and Plan: Diagnoses and all orders for this visit:  Fibromyalgia/Depression  -     DULoxetine (CYMBALTA) 60 MG capsule; TAKE 1 CAPSULE BY MOUTH EVERY DAY  Hypertension, unspecified type -     hydrochlorothiazide (HYDRODIURIL) 12.5 MG tablet; Take 1 tablet (12.5 mg total) by mouth daily. -     losartan (COZAAR) 50 MG tablet; Take 1 tablet (50 mg total) by mouth daily.  Chronic pain syndrome -     gabapentin (NEURONTIN) 300 MG capsule; Take 1 capsule (300 mg total) by mouth 3 (three) times daily.  Right arm  pain Stable unable to identify cause denies injuries .She state Aleves help with the pain cont to take will f/u on in person visit  Gastroesophageal reflux disease without esophagitis Controlled will PPI refilled   Follow Up Instructions:    I discussed the assessment and treatment plan with the patient. The patient was provided an opportunity to ask questions and all were answered. The patient agreed with the plan and demonstrated an understanding of the instructions.   The patient was advised to call back or seek an in-person evaluation if the symptoms worsen or if the condition fails to improve as anticipated.  I provided 18 minutes of non-face-to-face time during this encounter.   Kerin Perna, NP

## 2019-03-26 NOTE — Progress Notes (Signed)
Patient verified DOB Patient has taken medication today. Patient has eaten today. Patient complains of pain in the right shoulder increasing over the past 6 days ago. Patient has fibromyalgia and attributes the discomfort to this condition. Patient states she drops items because of the discomfort. Patient needs a mammogram referral placed and patient needs an GYN referral for her prolapse with Cystocele.

## 2019-04-01 ENCOUNTER — Other Ambulatory Visit: Payer: Self-pay

## 2019-04-01 ENCOUNTER — Encounter (INDEPENDENT_AMBULATORY_CARE_PROVIDER_SITE_OTHER): Payer: Self-pay | Admitting: Primary Care

## 2019-04-01 ENCOUNTER — Ambulatory Visit (INDEPENDENT_AMBULATORY_CARE_PROVIDER_SITE_OTHER): Payer: Self-pay | Admitting: Primary Care

## 2019-04-01 DIAGNOSIS — Z01419 Encounter for gynecological examination (general) (routine) without abnormal findings: Secondary | ICD-10-CM

## 2019-04-01 NOTE — Progress Notes (Signed)
Virtual Visit via Telephone Note  I connected with Amy Cline on 04/01/19 at  2:10 PM EDT by telephone and verified that I am speaking with the correct person using two identifiers.   I discussed the limitations, risks, security and privacy concerns of performing an evaluation and management service by telephone and the availability of in person appointments. I also discussed with the patient that there may be a patient responsible charge related to this service. The patient expressed understanding and agreed to proceed.   History of Present Illness: Amy Cline was being seen today for a Pap however she is also needing a mammogram.  Explained to patient there is assistance available through [facility] to do both exams and mammogram.  She has no other concerns complaints to address.    Observations/Objective: Review of systems 12 point negative  Assessment and Plan: Amy Cline was seen today for referral.  Diagnoses and all orders for this visit:  Well woman exam Patient will be referred to GYN for mammogram and Pap via [facility] assistance program.    Follow Up Instructions:    I discussed the assessment and treatment plan with the patient. The patient was provided an opportunity to ask questions and all were answered. The patient agreed with the plan and demonstrated an understanding of the instructions.   The patient was advised to call back or seek an in-person evaluation if the symptoms worsen or if the condition fails to improve as anticipated.  I provided 8 minutes of non-face-to-face time during this encounter.   Kerin Perna, NP

## 2019-05-17 ENCOUNTER — Other Ambulatory Visit: Payer: Self-pay | Admitting: Family Medicine

## 2019-05-17 DIAGNOSIS — G894 Chronic pain syndrome: Secondary | ICD-10-CM

## 2019-06-11 ENCOUNTER — Other Ambulatory Visit: Payer: Self-pay

## 2019-06-11 ENCOUNTER — Encounter (INDEPENDENT_AMBULATORY_CARE_PROVIDER_SITE_OTHER): Payer: Self-pay | Admitting: Primary Care

## 2019-06-11 ENCOUNTER — Ambulatory Visit (INDEPENDENT_AMBULATORY_CARE_PROVIDER_SITE_OTHER): Payer: Self-pay | Admitting: Primary Care

## 2019-06-11 DIAGNOSIS — R5383 Other fatigue: Secondary | ICD-10-CM

## 2019-06-11 DIAGNOSIS — R05 Cough: Secondary | ICD-10-CM

## 2019-06-11 DIAGNOSIS — R51 Headache: Secondary | ICD-10-CM

## 2019-06-11 DIAGNOSIS — Z1159 Encounter for screening for other viral diseases: Secondary | ICD-10-CM

## 2019-06-11 DIAGNOSIS — R509 Fever, unspecified: Secondary | ICD-10-CM

## 2019-06-11 DIAGNOSIS — R0602 Shortness of breath: Secondary | ICD-10-CM

## 2019-06-11 DIAGNOSIS — U071 COVID-19: Secondary | ICD-10-CM

## 2019-06-11 NOTE — Progress Notes (Signed)
Please place order for patient to have COVID-19 testing at [facility]. As symptomatic patients can not test at [facility].   Onset of symptoms Monday afternoon

## 2019-06-11 NOTE — Progress Notes (Signed)
Virtual Visit via Telephone Note  I connected with Amy Cline on 06/11/19 at  2:30 PM EDT by telephone and verified that I am speaking with the correct person using two identifiers.   I discussed the limitations, risks, security and privacy concerns of performing an evaluation and management service by telephone and the availability of in person appointments. I also discussed with the patient that there may be a patient responsible charge related to this service. The patient expressed understanding and agreed to proceed.   History of Present Illness: Amy Cline  is having a tele visit today she has been having symptoms since August 24 of fever  chills, Cough, Shortness of breath or difficulty breathing, Fatigue, Muscle or body aches, and Headache, Past Medical History:  Diagnosis Date  . Arthritis    Bil hands  . Bladder prolapse, female, acquired   . Breast mass    left  . Chronic headaches   . Coronary artery disease   . Fibromyalgia   . GERD (gastroesophageal reflux disease)   . Glaucoma   . Hyperlipemia   . Hypertension   . Insomnia   . Prediabetes   . Vitamin D deficiency    Observations/Objective Review of systems include positive for fever  chills, Cough, Shortness of breath or difficulty breathing, Fatigue, Muscle or body aches, and Headache.  All other systems of review are negative  Assessment and Plan: Amy Cline was seen today for covid symptoms .  Diagnoses and all orders for this visit:  2019 novel coronavirus disease (COVID-19) Coronavirus (COVID-19) Are you at risk?  Are you at risk for the Coronavirus (COVID-19)?  To be considered HIGH RISK for Coronavirus (COVID-19), you have to meet the following criteria:  . Traveled to [place], [place], [place], [place] or [place]; or in the [place] to [place], [place], [place], or [place]; and have fever, cough, and shortness of breath within the last 2 weeks of travel OR . Been in close contact with a person  diagnosed with COVID-19 within the last 2 weeks and have fever, cough, and shortness of breath . IF YOU DO NOT MEET THESE CRITERIA, YOU ARE CONSIDERED LOW RISK FOR COVID-19.  What to do if you are HIGH RISK for COVID-19?  Marland Kitchen. If you are having a medical emergency, call 911. . Seek medical care right away. Before you go to a doctor's office, urgent care or emergency department, call ahead and tell them about your recent travel, contact with someone diagnosed with COVID-19, and your symptoms. You should receive instructions from your physician's office regarding next steps of care.  . When you arrive at healthcare provider, tell the healthcare staff immediately you have returned from visiting [place], [place], [place], [place] or [place]; or traveled in the [place] to [place], [place], [place], or [place]; in the last two weeks or you have been in close contact with a person diagnosed with COVID-19 in the last 2 weeks.   . Tell the health care staff about your symptoms: fever, cough and shortness of breath. . After you have been seen by a medical provider, you will be either: o Tested for (COVID-19) and discharged home on quarantine except to seek medical care if symptoms worsen, and asked to  - Stay home and avoid contact with others until you get your results (4-5 days)  - Avoid travel on public transportation if possible (such as bus, train, or airplane) or o Sent to the Emergency Department by  EMS for evaluation, COVID-19 testing, and possible admission depending on your condition and test results.  What to do if you are LOW RISK for COVID-19?  Reduce your risk of any infection by using the same precautions used for avoiding the common cold or flu:  Marland Kitchen Wash your hands often with soap and warm water for at least 20 seconds.  If soap and water are not readily available, use an alcohol-based hand sanitizer with at least 60% alcohol.  . If coughing or sneezing, cover your mouth and nose by  coughing or sneezing into the elbow areas of your shirt or coat, into a tissue or into your sleeve (not your hands). . Avoid shaking hands with others and consider head nods or verbal greetings only. . Avoid touching your eyes, nose, or mouth with unwashed hands.  . Avoid close contact with people who are sick. . Avoid places or events with large numbers of people in one location, like concerts or sporting events. . Carefully consider travel plans you have or are making. . If you are planning any travel outside or inside the [place], visit the CDC's Travelers' Health webpage for the latest health notices. . If you have some symptoms but not all symptoms, continue to monitor at home and seek medical attention if your symptoms worsen. . If you are having a medical emergency, call 911.   [place] / e-Visit: [email or web address]         [facility]: [facility]: [phone]                   [facility]: [phone]  -     Cancel: Novel Coronavirus, NAA ([facility])    Follow Up Instructions:    I discussed the assessment and treatment plan with the patient. The patient was provided an opportunity to ask questions and all were answered. The patient agreed with the plan and demonstrated an understanding of the instructions.   The patient was advised to call back or seek an in-person evaluation if the symptoms worsen or if the condition fails to improve as anticipated.  I provided 18 minutes of non-face-to-face time during this encounter.   Kerin Perna, NP

## 2019-06-12 ENCOUNTER — Other Ambulatory Visit: Payer: Self-pay

## 2019-06-12 ENCOUNTER — Ambulatory Visit (INDEPENDENT_AMBULATORY_CARE_PROVIDER_SITE_OTHER): Payer: Self-pay | Admitting: Primary Care

## 2019-06-12 DIAGNOSIS — Z20822 Contact with and (suspected) exposure to covid-19: Secondary | ICD-10-CM

## 2019-06-13 LAB — NOVEL CORONAVIRUS, NAA: SARS-CoV-2, NAA: NOT DETECTED

## 2019-06-16 ENCOUNTER — Telehealth (INDEPENDENT_AMBULATORY_CARE_PROVIDER_SITE_OTHER): Payer: Self-pay

## 2019-06-16 NOTE — Telephone Encounter (Signed)
Please advise. [name], CMA  

## 2019-06-16 NOTE — Telephone Encounter (Signed)
Please schedule for an appointment

## 2019-06-16 NOTE — Telephone Encounter (Signed)
Patient wanting to know the results for her Covid-19 test. Patient also stated that she thinks she probably fainted on Sunday afternoon. Patient states she woke up and does not remember how or why she woke up in that location. Patient states she got up from the floor and was confused. Patient states she is worried because she has to drive and does not want this to happen while driving. Patient  States she is also having bowel movements without having the urgency. Patient states she goes to the bathroom to urinate but realizes she has had a bowel movement. Patient also states she is still having headaches. The pain is unbearable to the point were she can not open her eyes at point.   Please advice [phone]   Thank you Whitney Post

## 2019-06-20 ENCOUNTER — Other Ambulatory Visit (INDEPENDENT_AMBULATORY_CARE_PROVIDER_SITE_OTHER): Payer: Self-pay | Admitting: Primary Care

## 2019-06-21 ENCOUNTER — Other Ambulatory Visit (INDEPENDENT_AMBULATORY_CARE_PROVIDER_SITE_OTHER): Payer: Self-pay | Admitting: Primary Care

## 2019-06-24 NOTE — Telephone Encounter (Signed)
Patient has an appointment on 9-17 @11 :10 am

## 2019-07-02 ENCOUNTER — Encounter (INDEPENDENT_AMBULATORY_CARE_PROVIDER_SITE_OTHER): Payer: Self-pay | Admitting: Primary Care

## 2019-07-02 ENCOUNTER — Ambulatory Visit (INDEPENDENT_AMBULATORY_CARE_PROVIDER_SITE_OTHER): Payer: Medicare HMO | Admitting: Primary Care

## 2019-07-02 ENCOUNTER — Other Ambulatory Visit: Payer: Self-pay

## 2019-07-02 VITALS — BP 148/99 | HR 94 | Temp 97.1°F | Ht 63.0 in | Wt 160.2 lb

## 2019-07-02 DIAGNOSIS — M797 Fibromyalgia: Secondary | ICD-10-CM

## 2019-07-02 DIAGNOSIS — G894 Chronic pain syndrome: Secondary | ICD-10-CM

## 2019-07-02 DIAGNOSIS — E785 Hyperlipidemia, unspecified: Secondary | ICD-10-CM | POA: Diagnosis not present

## 2019-07-02 DIAGNOSIS — R300 Dysuria: Secondary | ICD-10-CM

## 2019-07-02 DIAGNOSIS — N390 Urinary tract infection, site not specified: Secondary | ICD-10-CM | POA: Diagnosis not present

## 2019-07-02 DIAGNOSIS — R42 Dizziness and giddiness: Secondary | ICD-10-CM | POA: Diagnosis not present

## 2019-07-02 DIAGNOSIS — R51 Headache: Secondary | ICD-10-CM

## 2019-07-02 DIAGNOSIS — I10 Essential (primary) hypertension: Secondary | ICD-10-CM

## 2019-07-02 LAB — POCT URINALYSIS DIP (CLINITEK)
Bilirubin, UA: NEGATIVE
Glucose, UA: NEGATIVE mg/dL
Nitrite, UA: POSITIVE — AB
POC PROTEIN,UA: NEGATIVE
Spec Grav, UA: 1.025 (ref 1.010–1.025)
Urobilinogen, UA: 0.2 E.U./dL
pH, UA: 5.5 (ref 5.0–8.0)

## 2019-07-02 MED ORDER — SULFAMETHOXAZOLE-TRIMETHOPRIM 800-160 MG PO TABS: 1.0000 | ORAL_TABLET | Freq: Two times a day (BID) | ORAL | 0 refills | Status: DC

## 2019-07-02 MED ORDER — LOSARTAN POTASSIUM-HCTZ 100-25 MG PO TABS: 1.0000 | ORAL_TABLET | Freq: Every day | ORAL | 0 refills | Status: DC

## 2019-07-02 MED ORDER — ATORVASTATIN CALCIUM 20 MG PO TABS: 20.0000 mg | ORAL_TABLET | Freq: Every day | ORAL | 1 refills | Status: DC

## 2019-07-02 MED ORDER — DULOXETINE HCL 60 MG PO CPEP: ORAL_CAPSULE | ORAL | 3 refills | Status: DC

## 2019-07-02 MED ORDER — GABAPENTIN 300 MG PO CAPS: 300.0000 mg | ORAL_CAPSULE | Freq: Three times a day (TID) | ORAL | 3 refills | Status: DC

## 2019-07-02 MED ORDER — FLUCONAZOLE 150 MG PO TABS: 150.0000 mg | ORAL_TABLET | Freq: Once | ORAL | 0 refills | Status: AC

## 2019-07-02 NOTE — Progress Notes (Signed)
Pt complains of falling a lot and not being aware Pt complains of sore throat and runny nose  Pt complains of excessive sweating  Pt had surgery on the right side of her head and she complains of sharp pains at times on that side

## 2019-07-02 NOTE — Progress Notes (Signed)
Established Patient Office Visit  Subjective:  Patient ID: Amy Cline, female    DOB: [birth year]  Age: 63 y.o. MRN: [id number]  CC:  Chief Complaint  Patient presents with  . Dizziness  . Headache    HPI Amy Cline presents for an acute visit with complaints of dizziness and headaches. She is unable to associated either symptom with cause, reaction and denies injury trauma. After asking a series of questions sometimes she become dizzy if she moves to quickly positional.  Past Medical History:  Diagnosis Date  . Arthritis    Bil hands  . Bladder prolapse, female, acquired   . Breast mass    left  . Chronic headaches   . Coronary artery disease   . Fibromyalgia   . GERD (gastroesophageal reflux disease)   . Glaucoma   . Hyperlipemia   . Hypertension   . Insomnia   . Prediabetes   . Vitamin D deficiency     Past Surgical History:  Procedure Laterality Date  . ABDOMINAL HYSTERECTOMY  2008  . APPENDECTOMY    . ARTERY BIOPSY Right 09/16/2018   Procedure: BIOPSY TEMPORAL ARTERY RIGHT;  Surgeon: Waynetta Sandy, MD;  Location: [facility];  Service: Vascular;  Laterality: Right;  . BREAST BIOPSY     benign/left breast  . COLONOSCOPY  07/2018    Family History  Problem Relation Age of Onset  . Hypertension Mother   . Hypertension Father   . Migraines Neg Hx     Social History   Socioeconomic History  . Marital status: Legally Separated    Spouse name: Marcello Moores  . Number of children: 2  . Years of education: 20  . Highest education level: Not on file  Occupational History  . Occupation: Retired  Scientific laboratory technician  . Financial resource strain: Not on file  . Food insecurity    Worry: Not on file    Inability: Not on file  . Transportation needs    Medical: Not on file    Non-medical: Not on file  Tobacco Use  . Smoking status: Never Smoker  . Smokeless tobacco: Never Used  Substance and Sexual Activity  . Alcohol use: Not Currently    Comment:  SOCIAL  . Drug use: No  . Sexual activity: Yes    Birth control/protection: Surgical  Lifestyle  . Physical activity    Days per week: Not on file    Minutes per session: Not on file  . Stress: Not on file  Relationships  . Social Herbalist on phone: Not on file    Gets together: Not on file    Attends religious service: Not on file    Active member of club or organization: Not on file    Attends meetings of clubs or organizations: Not on file    Relationship status: Not on file  . Intimate partner violence    Fear of current or ex partner: Not on file    Emotionally abused: Not on file    Physically abused: Not on file    Forced sexual activity: Not on file  Other Topics Concern  . Not on file  Social History Narrative   Lives at home alone   Caffeine use: 1-2 cups per day    Outpatient Medications Prior to Visit  Medication Sig Dispense Refill  . alendronate (FOSAMAX) 35 MG tablet Take 1 tablet (35 mg total) by mouth every 7 (seven) days. Take with a full glass of  water on an empty stomach. 4 tablet 11  . B Complex-C (SUPER B COMPLEX PO) Take by mouth daily at 6 (six) AM.    . carboxymethylcellulose (REFRESH PLUS) 0.5 % SOLN 1 drop as needed.    . Cholecalciferol (VITAMIN D-3) 5000 units TABS Take by mouth daily at 6 (six) AM.    . omeprazole (PRILOSEC) 20 MG capsule TAKE 1 CAPSULE BY MOUTH EVERY DAY 90 capsule 0  . ondansetron (ZOFRAN-ODT) 4 MG disintegrating tablet Take 1 tablet (4 mg total) by mouth every 8 (eight) hours as needed for nausea. 30 tablet 3  . SUMAtriptan (IMITREX) 100 MG tablet Take 1 tablet (100 mg total) by mouth once as needed for up to 1 dose. May repeat in 2 hours if headache persists or recurs. 10 tablet 12  . triamcinolone cream (KENALOG) 0.1 % Apply 1 application topically 2 (two) times daily. (Patient taking differently: Apply 1 application topically as needed. ) 30 g 0  . atorvastatin (LIPITOR) 20 MG tablet Take 1 tablet (20 mg total) by  mouth daily. 90 tablet 1  . DULoxetine (CYMBALTA) 60 MG capsule TAKE 1 CAPSULE BY MOUTH EVERY DAY 30 capsule 3  . gabapentin (NEURONTIN) 300 MG capsule Take 1 capsule (300 mg total) by mouth 3 (three) times daily. 270 capsule 3  . losartan-hydrochlorothiazide (HYZAAR) 50-12.5 MG tablet TAKE 1 TABLET BY MOUTH EVERY DAY 90 tablet 1  . nortriptyline (PAMELOR) 25 MG capsule TAKE 1 CAPSULE (25 MG TOTAL) BY MOUTH AT BEDTIME. FOR MIGRAINE PREVENTION. 90 capsule 2   Facility-Administered Medications Prior to Visit  Medication Dose Route Frequency Provider Last Rate Last Dose  . 0.9 %  sodium chloride infusion  500 mL Intravenous Once Ladene Artist, MD        No Known Allergies  ROS Review of Systems  Constitutional: Positive for fatigue.  Genitourinary: Positive for dysuria and urgency.  Musculoskeletal: Positive for back pain.  Neurological: Positive for dizziness and headaches.  Psychiatric/Behavioral: Positive for agitation. The patient is nervous/anxious.       Objective:    Physical Exam  Constitutional: She appears well-developed and well-nourished.  HENT:  Head: Normocephalic.  Eyes: Pupils are equal, round, and reactive to light. EOM are normal.  Neck: Normal range of motion. Neck supple.  Abdominal: Soft.    BP (!) 148/99 (BP Location: Right Arm, Patient Position: Sitting, Cuff Size: Normal)   Pulse 94   Temp (!) 97.1 F (36.2 C) (Tympanic)   Ht 5' 3" (1.6 m)   Wt 160 lb 3.2 oz (72.7 kg)   SpO2 98%   BMI 28.38 kg/m  Wt Readings from Last 3 Encounters:  07/02/19 160 lb 3.2 oz (72.7 kg)  12/22/18 149 lb 6.4 oz (67.8 kg)  11/18/18 148 lb (67.1 kg)     Health Maintenance Due  Topic Date Due  . PAP SMEAR-Modifier  07/12/1975  . INFLUENZA VACCINE  05/16/2019    There are no preventive care reminders to display for this patient.  Lab Results  Component Value Date   TSH 2.660 08/09/2017   Lab Results  Component Value Date   WBC 5.7 07/02/2019   HGB 12.7  07/02/2019   HCT 38.2 07/02/2019   MCV 94 07/02/2019   PLT 278 07/02/2019   Lab Results  Component Value Date   NA 140 07/02/2019   K 5.2 07/02/2019   CO2 26 07/02/2019   GLUCOSE 81 07/02/2019   BUN 25 07/02/2019   CREATININE 0.83 07/02/2019  BILITOT <0.2 07/02/2019   ALKPHOS 91 07/02/2019   AST 29 07/02/2019   ALT 24 07/02/2019   PROT 7.2 07/02/2019   ALBUMIN 4.1 07/02/2019   CALCIUM 9.9 07/02/2019   ANIONGAP 10 09/16/2018   Lab Results  Component Value Date   CHOL 148 07/02/2019   Lab Results  Component Value Date   HDL 52 07/02/2019   Lab Results  Component Value Date   LDLCALC 111 (H) 08/09/2017   Lab Results  Component Value Date   TRIG 201 (H) 07/02/2019   Lab Results  Component Value Date   CHOLHDL 2.8 07/02/2019   No results found for: HGBA1C    Assessment & Plan:  Shaguana was seen today for dizziness and headache.  Diagnoses and all orders for this visit:  Essential hypertension Goal BP:  For patients younger than 60: Goal BP < 130/80 For patients 60 and older: Goal BP < 150/90. For patients with diabetes: Goal BP < 130/80. Your most recent BP: 148/98  mmHg.  Take your medications faithfully as instructed. Maintain a healthy weight. Get at least 150 minutes of aerobic exercise per week. Minimize salt intake. Minimize alcohol intake  -     CBC with Differential -     CMP14+EGFR -     Lipid Panel  Fibromyalgia  May alternate with heat and ice application for pain relief. May also alternate with acetaminophen and Ibuprofen as prescribed pain relief. Other alternatives include massage, acupuncture and water aerobics.  You must stay active and avoid a sedentary lifestyle. -     DULoxetine (CYMBALTA) 60 MG capsule; TAKE 1 CAPSULE BY MOUTH EVERY DAY  Hyperlipidemia, unspecified hyperlipidemia type Encourage to trying to eating a low fat, heart healthy diet and participate in regular aerobic exercise program to control as well. Exercise at  least  30 minutes per day-5 days per week. Avoid red meat. No fried foods. No junk foods, sodas, sugary foods or drinks, unhealthy snacking, alcohol or smoking. -     atorvastatin (LIPITOR) 20 MG tablet; Take 1 tablet (20 mg total) by mouth daily.  Chronic pain syndrome -     gabapentin (NEURONTIN) 300 MG capsule; Take 1 capsule (300 mg total) by mouth 3 (three) times daily.  Urinary tract infection without hematuria, site unspecified Increase urination frequency, burning and pain maybe signs of infection. A urinalysis will be done in the case a urinary tract infection is present .The urine will be sent for culture and sensitivity. -     Urine Culture -     Cancel: Urinalysis  Burning with urination Rule out UTI -     POCT URINALYSIS DIP (CLINITEK)  Other orders -     losartan-hydrochlorothiazide (HYZAAR) 100-25 MG tablet; Take 1 tablet by mouth daily. -     sulfamethoxazole-trimethoprim (BACTRIM DS) 800-160 MG tablet; Take 1 tablet by mouth 2 (two) times daily. -     fluconazole (DIFLUCAN) 150 MG tablet; Take 1 tablet (150 mg total) by mouth once for 1 dose.    Meds ordered this encounter  Medications  . losartan-hydrochlorothiazide (HYZAAR) 100-25 MG tablet    Sig: Take 1 tablet by mouth daily.    Dispense:  90 tablet    Refill:  0  . DULoxetine (CYMBALTA) 60 MG capsule    Sig: TAKE 1 CAPSULE BY MOUTH EVERY DAY    Dispense:  30 capsule    Refill:  3  . atorvastatin (LIPITOR) 20 MG tablet    Sig: Take 1  tablet (20 mg total) by mouth daily.    Dispense:  90 tablet    Refill:  1  . gabapentin (NEURONTIN) 300 MG capsule    Sig: Take 1 capsule (300 mg total) by mouth 3 (three) times daily.    Dispense:  270 capsule    Refill:  3    ICD 10 - M79.7  . sulfamethoxazole-trimethoprim (BACTRIM DS) 800-160 MG tablet    Sig: Take 1 tablet by mouth 2 (two) times daily.    Dispense:  14 tablet    Refill:  0  . fluconazole (DIFLUCAN) 150 MG tablet    Sig: Take 1 tablet (150 mg total)  by mouth once for 1 dose.    Dispense:  1 tablet    Refill:  0    Follow-up: Return in about 6 weeks (around 08/13/2019) for Bp recheck .    Kerin Perna, NP

## 2019-07-03 LAB — CMP14+EGFR
ALT: 24 IU/L (ref 0–32)
AST: 29 IU/L (ref 0–40)
Albumin/Globulin Ratio: 1.3 (ref 1.2–2.2)
Albumin: 4.1 g/dL (ref 3.8–4.8)
Alkaline Phosphatase: 91 IU/L (ref 39–117)
BUN/Creatinine Ratio: 30 — ABNORMAL HIGH (ref 12–28)
BUN: 25 mg/dL (ref 8–27)
Bilirubin Total: <0.2 mg/dL (ref 0.0–1.2)
CO2: 26 mmol/L (ref 20–29)
Calcium: 9.9 mg/dL (ref 8.7–10.3)
Chloride: 101 mmol/L (ref 96–106)
Creatinine, Ser: 0.83 mg/dL (ref 0.57–1.00)
GFR calc Af Amer: 86 mL/min/{1.73_m2} (ref >59)
GFR calc non Af Amer: 75 mL/min/{1.73_m2} (ref >59)
Globulin, Total: 3.1 g/dL (ref 1.5–4.5)
Glucose: 81 mg/dL (ref 65–99)
Potassium: 5.2 mmol/L (ref 3.5–5.2)
Sodium: 140 mmol/L (ref 134–144)
Total Protein: 7.2 g/dL (ref 6.0–8.5)

## 2019-07-03 LAB — CBC WITH DIFFERENTIAL/PLATELET
Basophils Absolute: 0 10*3/uL (ref 0.0–0.2)
Basos: 1 %
EOS (ABSOLUTE): 0 10*3/uL (ref 0.0–0.4)
Eos: 1 %
Hematocrit: 38.2 % (ref 34.0–46.6)
Hemoglobin: 12.7 g/dL (ref 11.1–15.9)
Immature Grans (Abs): 0 10*3/uL (ref 0.0–0.1)
Immature Granulocytes: 0 %
Lymphocytes Absolute: 1.6 10*3/uL (ref 0.7–3.1)
Lymphs: 28 %
MCH: 31.4 pg (ref 26.6–33.0)
MCHC: 33.2 g/dL (ref 31.5–35.7)
MCV: 94 fL (ref 79–97)
Monocytes Absolute: 0.5 10*3/uL (ref 0.1–0.9)
Monocytes: 9 %
Neutrophils Absolute: 3.5 10*3/uL (ref 1.4–7.0)
Neutrophils: 61 %
Platelets: 278 10*3/uL (ref 150–450)
RBC: 4.05 x10E6/uL (ref 3.77–5.28)
RDW: 13 % (ref 11.7–15.4)
WBC: 5.7 10*3/uL (ref 3.4–10.8)

## 2019-07-03 LAB — LIPID PANEL
Chol/HDL Ratio: 2.8 ratio (ref 0.0–4.4)
Cholesterol, Total: 148 mg/dL (ref 100–199)
HDL: 52 mg/dL (ref >39)
LDL Chol Calc (NIH): 63 mg/dL (ref 0–99)
Triglycerides: 201 mg/dL — ABNORMAL HIGH (ref 0–149)
VLDL Cholesterol Cal: 33 mg/dL (ref 5–40)

## 2019-07-04 LAB — URINE CULTURE

## 2019-07-06 ENCOUNTER — Telehealth (INDEPENDENT_AMBULATORY_CARE_PROVIDER_SITE_OTHER): Payer: Self-pay

## 2019-07-06 NOTE — Telephone Encounter (Signed)
Patient called to inform that she is having really bad back pain and that her shoulder blades feel swollen. Patient states she can not move around without have a lot of pain. Patient would like to know what she could take for this pain and inflammation. Patient states she already took 3 of gabapentin 300mg  to help ease the pain but it is not working.   Please advice [phone]  Thank you Whitney Post

## 2019-07-06 NOTE — Telephone Encounter (Signed)
FWD to PCP. Prateek Knipple S Mialee Weyman, CMA  

## 2019-07-07 ENCOUNTER — Other Ambulatory Visit (INDEPENDENT_AMBULATORY_CARE_PROVIDER_SITE_OTHER): Payer: Self-pay | Admitting: Primary Care

## 2019-07-07 MED ORDER — IBUPROFEN 600 MG PO TABS: 600.0000 mg | ORAL_TABLET | Freq: Three times a day (TID) | ORAL | 1 refills | Status: DC | PRN

## 2019-07-07 NOTE — Telephone Encounter (Signed)
Sent in ibuprofen may take with food 3 times a day for moderate pain

## 2019-07-09 NOTE — Telephone Encounter (Signed)
Patients daughter is aware that ibuprofen has been sent to pharmacy. Patient can take up to three times daily with food for pain. Daughter began to ask questions about patient taking double the dose of antihypertensives. Transferred call to PCP. Nat Christen, CMA

## 2019-07-22 ENCOUNTER — Ambulatory Visit (INDEPENDENT_AMBULATORY_CARE_PROVIDER_SITE_OTHER): Payer: Medicare HMO | Admitting: Primary Care

## 2019-08-12 ENCOUNTER — Other Ambulatory Visit: Payer: Self-pay | Admitting: Primary Care

## 2019-08-12 DIAGNOSIS — Z0000 Encounter for general adult medical examination without abnormal findings: Secondary | ICD-10-CM | POA: Diagnosis not present

## 2019-08-12 DIAGNOSIS — Z131 Encounter for screening for diabetes mellitus: Secondary | ICD-10-CM | POA: Diagnosis not present

## 2019-08-12 DIAGNOSIS — I10 Essential (primary) hypertension: Secondary | ICD-10-CM | POA: Diagnosis not present

## 2019-08-12 DIAGNOSIS — K219 Gastro-esophageal reflux disease without esophagitis: Secondary | ICD-10-CM | POA: Diagnosis not present

## 2019-08-12 DIAGNOSIS — E559 Vitamin D deficiency, unspecified: Secondary | ICD-10-CM | POA: Diagnosis not present

## 2019-08-12 DIAGNOSIS — R002 Palpitations: Secondary | ICD-10-CM | POA: Diagnosis not present

## 2019-08-12 DIAGNOSIS — M797 Fibromyalgia: Secondary | ICD-10-CM | POA: Diagnosis not present

## 2019-08-12 DIAGNOSIS — E785 Hyperlipidemia, unspecified: Secondary | ICD-10-CM | POA: Diagnosis not present

## 2019-08-12 DIAGNOSIS — G894 Chronic pain syndrome: Secondary | ICD-10-CM

## 2019-08-12 DIAGNOSIS — Z0100 Encounter for examination of eyes and vision without abnormal findings: Secondary | ICD-10-CM | POA: Diagnosis not present

## 2019-08-13 ENCOUNTER — Ambulatory Visit (INDEPENDENT_AMBULATORY_CARE_PROVIDER_SITE_OTHER): Payer: Medicare HMO | Admitting: Primary Care

## 2019-08-18 DIAGNOSIS — Z1231 Encounter for screening mammogram for malignant neoplasm of breast: Secondary | ICD-10-CM | POA: Diagnosis not present

## 2019-08-31 DIAGNOSIS — E559 Vitamin D deficiency, unspecified: Secondary | ICD-10-CM | POA: Diagnosis not present

## 2019-08-31 DIAGNOSIS — M810 Age-related osteoporosis without current pathological fracture: Secondary | ICD-10-CM | POA: Diagnosis not present

## 2019-08-31 DIAGNOSIS — K219 Gastro-esophageal reflux disease without esophagitis: Secondary | ICD-10-CM | POA: Diagnosis not present

## 2019-08-31 DIAGNOSIS — M797 Fibromyalgia: Secondary | ICD-10-CM | POA: Diagnosis not present

## 2019-08-31 DIAGNOSIS — E785 Hyperlipidemia, unspecified: Secondary | ICD-10-CM | POA: Diagnosis not present

## 2019-08-31 DIAGNOSIS — N8110 Cystocele, unspecified: Secondary | ICD-10-CM | POA: Diagnosis not present

## 2019-08-31 DIAGNOSIS — I10 Essential (primary) hypertension: Secondary | ICD-10-CM | POA: Diagnosis not present

## 2019-08-31 DIAGNOSIS — R002 Palpitations: Secondary | ICD-10-CM | POA: Diagnosis not present

## 2019-09-07 DIAGNOSIS — R3121 Asymptomatic microscopic hematuria: Secondary | ICD-10-CM | POA: Diagnosis not present

## 2019-09-07 DIAGNOSIS — N819 Female genital prolapse, unspecified: Secondary | ICD-10-CM | POA: Diagnosis not present

## 2019-09-29 ENCOUNTER — Other Ambulatory Visit (INDEPENDENT_AMBULATORY_CARE_PROVIDER_SITE_OTHER): Payer: Self-pay | Admitting: Primary Care

## 2019-09-29 DIAGNOSIS — N819 Female genital prolapse, unspecified: Secondary | ICD-10-CM | POA: Diagnosis not present

## 2019-09-29 DIAGNOSIS — R350 Frequency of micturition: Secondary | ICD-10-CM | POA: Diagnosis not present

## 2019-09-29 NOTE — Telephone Encounter (Signed)
Sent to PCP ?

## 2019-10-06 DIAGNOSIS — N819 Female genital prolapse, unspecified: Secondary | ICD-10-CM | POA: Diagnosis not present

## 2019-11-03 ENCOUNTER — Other Ambulatory Visit: Payer: Self-pay | Admitting: Primary Care

## 2019-11-03 DIAGNOSIS — G894 Chronic pain syndrome: Secondary | ICD-10-CM

## 2019-11-04 DIAGNOSIS — N816 Rectocele: Secondary | ICD-10-CM | POA: Diagnosis not present

## 2019-11-04 DIAGNOSIS — R350 Frequency of micturition: Secondary | ICD-10-CM | POA: Diagnosis not present

## 2019-11-06 DIAGNOSIS — N816 Rectocele: Secondary | ICD-10-CM | POA: Diagnosis not present

## 2019-11-06 DIAGNOSIS — R350 Frequency of micturition: Secondary | ICD-10-CM | POA: Diagnosis not present

## 2019-11-18 ENCOUNTER — Other Ambulatory Visit: Payer: Self-pay | Admitting: Urology

## 2019-11-30 DIAGNOSIS — N816 Rectocele: Secondary | ICD-10-CM | POA: Diagnosis not present

## 2019-11-30 DIAGNOSIS — E785 Hyperlipidemia, unspecified: Secondary | ICD-10-CM | POA: Diagnosis not present

## 2019-11-30 DIAGNOSIS — E559 Vitamin D deficiency, unspecified: Secondary | ICD-10-CM | POA: Diagnosis not present

## 2019-11-30 DIAGNOSIS — R55 Syncope and collapse: Secondary | ICD-10-CM | POA: Diagnosis not present

## 2019-11-30 DIAGNOSIS — M797 Fibromyalgia: Secondary | ICD-10-CM | POA: Diagnosis not present

## 2019-11-30 DIAGNOSIS — N8110 Cystocele, unspecified: Secondary | ICD-10-CM | POA: Diagnosis not present

## 2019-11-30 DIAGNOSIS — R002 Palpitations: Secondary | ICD-10-CM | POA: Diagnosis not present

## 2019-11-30 DIAGNOSIS — Z131 Encounter for screening for diabetes mellitus: Secondary | ICD-10-CM | POA: Diagnosis not present

## 2019-11-30 DIAGNOSIS — K219 Gastro-esophageal reflux disease without esophagitis: Secondary | ICD-10-CM | POA: Diagnosis not present

## 2019-11-30 DIAGNOSIS — I10 Essential (primary) hypertension: Secondary | ICD-10-CM | POA: Diagnosis not present

## 2019-12-11 NOTE — Patient Instructions (Addendum)
DUE TO COVID-19 ONLY ONE VISITOR IS ALLOWED TO COME WITH YOU AND STAY IN THE WAITING ROOM ONLY DURING PRE OP AND PROCEDURE DAY OF SURGERY. THE 1 VISITOR MAY VISIT WITH YOU AFTER SURGERY IN YOUR PRIVATE ROOM DURING VISITING HOURS ONLY!  YOU NEED TO HAVE A COVID 19 TEST ON:12/18/19@  10:00 am , THIS TEST MUST BE DONE BEFORE SURGERY, COME  [address],  [place] , [address].  [facility]) ONCE YOUR COVID TEST IS COMPLETED, PLEASE BEGIN THE QUARANTINE INSTRUCTIONS AS OUTLINED IN YOUR HANDOUT.                Amy Cline     Your procedure is scheduled on: 12/22/19    Report to [facility] Main  Entrance   Report to SHORT STAY at: 5:30 AM     Call this number if you have problems the morning of surgery [phone]    Remember: Do not eat food or drink liquids :After Midnight.   BRUSH YOUR TEETH MORNING OF SURGERY AND RINSE YOUR MOUTH OUT, NO CHEWING GUM CANDY OR MINTS.     Take these medicines the morning of surgery with A SIP OF WATER: DULOXETINE(CYMBALTA),GABAPENTIN,OMEPRAZOLE,SULFAMETHOXAZOLE,SUMATRIPTAN(AS NEEDED)                                 You may not have any metal on your body including hair pins and              piercings  Do not wear jewelry, make-up, lotions, powders or perfumes, deodorant             Do not wear nail polish on your fingernails.  Do not shave  48 hours prior to surgery.                Do not bring valuables to the hospital. [facility] IS NOT             RESPONSIBLE   FOR VALUABLES.  Contacts, dentures or bridgework may not be worn into surgery.  Leave suitcase in the car. After surgery it may be brought to your room.     Patients discharged the day of surgery will not be allowed to drive home. IF YOU ARE HAVING SURGERY AND GOING HOME THE SAME DAY, YOU MUST HAVE AN ADULT TO DRIVE YOU HOME AND BE WITH YOU FOR 24 HOURS. YOU MAY GO HOME BY TAXI OR UBER OR ORTHERWISE, BUT AN ADULT MUST ACCOMPANY YOU HOME AND STAY WITH YOU FOR  24 HOURS.  Name and phone number of your driver:  Special Instructions: N/A              Please read over the following fact sheets you were given: _____________________________________________________________________             [facility] - Preparing for Surgery Before surgery, you can play an important role.  Because skin is not sterile, your skin needs to be as free of germs as possible.  You can reduce the number of germs on your skin by washing with CHG (chlorahexidine gluconate) soap before surgery.  CHG is an antiseptic cleaner which kills germs and bonds with the skin to continue killing germs even after washing. Please DO NOT use if you have an allergy to CHG or antibacterial soaps.  If your skin becomes reddened/irritated stop using the CHG and inform your nurse when you arrive at Short Stay.  Do not shave (including legs and underarms) for at least 48 hours prior to the first CHG shower.  You may shave your face/neck. Please follow these instructions carefully:  1.  Shower with CHG Soap the night before surgery and the  morning of Surgery.  2.  If you choose to wash your hair, wash your hair first as usual with your  normal  shampoo.  3.  After you shampoo, rinse your hair and body thoroughly to remove the  shampoo.                           4.  Use CHG as you would any other liquid soap.  You can apply chg directly  to the skin and wash                       Gently with a scrungie or clean washcloth.  5.  Apply the CHG Soap to your body ONLY FROM THE NECK DOWN.   Do not use on face/ open                           Wound or open sores. Avoid contact with eyes, ears mouth and genitals (private parts).                       Wash face,  Genitals (private parts) with your normal soap.             6.  Wash thoroughly, paying special attention to the area where your surgery  will be performed.  7.  Thoroughly rinse your body with warm water from the neck down.  8.  DO NOT shower/wash  with your normal soap after using and rinsing off  the CHG Soap.                9.  Pat yourself dry with a clean towel.            10.  Wear clean pajamas.            11.  Place clean sheets on your bed the night of your first shower and do not  sleep with pets. Day of Surgery : Do not apply any lotions/deodorants the morning of surgery.  Please wear clean clothes to the hospital/surgery center.  FAILURE TO FOLLOW THESE INSTRUCTIONS MAY RESULT IN THE CANCELLATION OF YOUR SURGERY PATIENT SIGNATURE_________________________________  NURSE SIGNATURE__________________________________  ________________________________________________________________________

## 2019-12-14 ENCOUNTER — Encounter (HOSPITAL_COMMUNITY)
Admission: RE | Admit: 2019-12-14 | Discharge: 2019-12-14 | Disposition: A | Discharge status: Home / Self Care | Payer: Medicare HMO | Source: Ambulatory Visit | Attending: Urology | Admitting: Urology

## 2019-12-14 ENCOUNTER — Encounter (HOSPITAL_COMMUNITY): Payer: Self-pay

## 2019-12-14 ENCOUNTER — Other Ambulatory Visit: Payer: Self-pay

## 2019-12-14 DIAGNOSIS — Z01818 Encounter for other preprocedural examination: Secondary | ICD-10-CM | POA: Diagnosis not present

## 2019-12-14 LAB — HEMOGLOBIN A1C
Hgb A1c MFr Bld: 6.1 % — ABNORMAL HIGH (ref 4.8–5.6)
Mean Plasma Glucose: 128.37 mg/dL

## 2019-12-14 LAB — BASIC METABOLIC PANEL
Anion gap: 5 (ref 5–15)
BUN: 22 mg/dL (ref 8–23)
CO2: 28 mmol/L (ref 22–32)
Calcium: 8.6 mg/dL — ABNORMAL LOW (ref 8.9–10.3)
Chloride: 104 mmol/L (ref 98–111)
Creatinine, Ser: 0.68 mg/dL (ref 0.44–1.00)
GFR calc Af Amer: >60 mL/min (ref >60)
GFR calc non Af Amer: >60 mL/min (ref >60)
Glucose, Bld: 98 mg/dL (ref 70–99)
Potassium: 4 mmol/L (ref 3.5–5.1)
Sodium: 137 mmol/L (ref 135–145)

## 2019-12-14 LAB — CBC
HCT: 38 % (ref 36.0–46.0)
Hemoglobin: 12.6 g/dL (ref 12.0–15.0)
MCH: 31.6 pg (ref 26.0–34.0)
MCHC: 33.2 g/dL (ref 30.0–36.0)
MCV: 95.2 fL (ref 80.0–100.0)
Platelets: 242 10*3/uL (ref 150–400)
RBC: 3.99 MIL/uL (ref 3.87–5.11)
RDW: 13.2 % (ref 11.5–15.5)
WBC: 4.8 10*3/uL (ref 4.0–10.5)
nRBC: 0 % (ref 0.0–0.2)

## 2019-12-14 LAB — PROTIME-INR
INR: 1 (ref 0.8–1.2)
Prothrombin Time: 12.6 seconds (ref 11.4–15.2)

## 2019-12-14 NOTE — Progress Notes (Signed)
PCP - Gwinda Passe NP LOV: 07/02/19 Cardiologist -   Chest x-ray -  EKG -  Stress Test -  ECHO -  Cardiac Cath -   Sleep Study -  CPAP -   Fasting Blood Sugar -  Checks Blood Sugar _____ times a day  Blood Thinner Instructions: Aspirin Instructions: Last Dose:  Anesthesia review:   Patient denies shortness of breath, fever, cough and chest pain at PAT appointment   Patient verbalized understanding of instructions that were given to them at the PAT appointment. Patient was also instructed that they will need to review over the PAT instructions again at home before surgery.

## 2019-12-18 ENCOUNTER — Other Ambulatory Visit (HOSPITAL_COMMUNITY)
Admission: RE | Admit: 2019-12-18 | Discharge: 2019-12-18 | Disposition: A | Discharge status: Home / Self Care | Payer: Medicare HMO | Source: Ambulatory Visit | Attending: Urology | Admitting: Urology

## 2019-12-18 DIAGNOSIS — Z20822 Contact with and (suspected) exposure to covid-19: Secondary | ICD-10-CM | POA: Insufficient documentation

## 2019-12-18 DIAGNOSIS — Z01812 Encounter for preprocedural laboratory examination: Secondary | ICD-10-CM | POA: Diagnosis not present

## 2019-12-18 LAB — SARS CORONAVIRUS 2 (TAT 6-24 HRS): SARS Coronavirus 2: NEGATIVE

## 2019-12-20 NOTE — H&P (Signed)
I am re-evaluating the patient has prolapse. She was a liner once or twice a day and she may leak a little bit of urine not associated awareness. She denies stress incontinence and urge incontinence. She may have discharge instead. History was a little bit difficult to the translator. She voids every 30-60 minutes and cannot hold it for 2 hours. She has no nocturia   She has had a hysterectomy. She can feel vaginal bulging sometimes reduces it. No splinting. Normal bowel movements. Prolapse may be worse with constipation   May have had kidney stones in the past. No previous bladder surgery. No bladder infection. No neurologic issues   Urodynamics did not demonstrate urge incontinence or stress incontinence   On pelvic examination the examination was consistent. She had a large grade 2 rectocele especially distally. I could not identify an enterocele. Vaginal cuff to sent from 8 or 9 cm to 5 cm. When I reduce the cuff posteriorly she had a flat small grade 2 cystocele. There would be no defect to fix. It is almost like a high trap door. No stress incontinence   Picture drawn. I went over transvaginal vault suspension with rectocele repair and graft and intraoperative inspection for enterocele. Watchful waiting pessary discussed  Frequency due to overactive bladder unrelated   We talked quite a bit about a pessary. She is active and would like to proceed with surgery and this good choice for her. Interpreted a great job. She is sexually active.        ALLERGIES: None   MEDICATIONS: Omeprazole 20 mg capsule,delayed release  Amlodipine Besylate 2.5 mg tablet  Flonase Allergy Relief 50 mcg/actuation spray, suspension  Gabapentin 300 mg capsule  Losartan Potassium 50 mg tablet  Losartan Potassium 50 mg tablet  Melatonin 10 mg capsule  Nortriptyline Hcl 25 mg capsule  Vitamin D3     GU PSH: Complex cystometrogram, w/ void pressure and urethral pressure profile studies, any technique -  09/29/2019 Complex Uroflow - 09/29/2019 Emg surf Electrd - 09/29/2019 Hysterectomy, 2008 Inject For cystogram - 09/29/2019 Intrabd voidng Press - 09/29/2019       PSH Notes: colonoscopy (2019)   NON-GU PSH: None   GU PMH: Rectocele - 11/04/2019 Female genital prolapse, unspecified - 10/06/2019, - 09/07/2019 Urinary Frequency - 09/29/2019 Microscopic hematuria - 09/07/2019    NON-GU PMH: Arthritis Fibromyalgia GERD Hypertension    FAMILY HISTORY: 1 Daughter - Daughter 2 sons - Son   SOCIAL HISTORY: Marital Status: Single Preferred Language: Spanish; Castilian; Ethnicity:  Current Smoking Status: Patient has never smoked.   Tobacco Use Assessment Completed: Used Tobacco in last 30 days? Has never drank.  Drinks 1 caffeinated drink per day. Patient's occupation Nurse, children's.    REVIEW OF SYSTEMS:    GU Review Female:   Patient denies frequent urination, hard to postpone urination, burning /pain with urination, get up at night to urinate, leakage of urine, stream starts and stops, trouble starting your stream, have to strain to urinate, and being pregnant.  Gastrointestinal (Upper):   Patient denies nausea, vomiting, and indigestion/ heartburn.  Gastrointestinal (Lower):   Patient denies diarrhea and constipation.  Constitutional:   Patient denies fever, night sweats, weight loss, and fatigue.  Skin:   Patient denies skin rash/ lesion and itching.  Eyes:   Patient denies blurred vision and double vision.  Ears/ Nose/ Throat:   Patient denies sore throat and sinus problems.  Hematologic/Lymphatic:   Patient denies swollen glands and easy bruising.  Cardiovascular:  Patient denies leg swelling and chest pains.  Respiratory:   Patient denies cough and shortness of breath.  Endocrine:   Patient denies excessive thirst.  Musculoskeletal:   Patient denies back pain and joint pain.  Neurological:   Patient denies headaches and dizziness.  Psychologic:   Patient  denies depression and anxiety.   VITAL SIGNS:      11/06/2019 02:13 PM  Weight 160 lb / 72.57 kg  BP 126/78 mmHg  Pulse 92 /min  Temperature 98.0 F / 36.6 C   PAST DATA REVIEWED:  Source Of History:  Patient   PROCEDURES:          Urinalysis Dipstick Dipstick Cont'd  Color: Yellow Bilirubin: Neg mg/dL  Appearance: Clear Ketones: Neg mg/dL  Specific Gravity: 1.015 Blood: Neg ery/uL  pH: 6.0 Protein: Neg mg/dL  Glucose: Neg mg/dL Urobilinogen: 0.2 mg/dL    Nitrites: Neg    Leukocyte Esterase: Neg leu/uL    ASSESSMENT:      ICD-10 Details  1 GU:   Rectocele - N81.6   2   Urinary Frequency - R35.0               Notes:   I drew her a picture and we talked about prolapse surgery in detail. Pros, cons, general surgical and anesthetic risks, and other options including behavioral therapy, pessaries, and watchful waiting were discussed. She understands that prolapse repairs are successful in 80-85% of cases for prolapse symptoms and can recur anteriorly, posteriorly, and/or apically. She understands that in most cases I use a graft and general risks were discussed. Surgical risks were described but not limited to the discussion of injury to neighboring structures including the bowel (with possible life-threatening sepsis and colostomy), bladder, urethra, vagina (all resulting in further surgery), and ureter (resulting in re-implantation). We talked about injury to nerves/soft tissue leading to debilitating and intractable pelvic, abdominal, and lower extremity pain syndromes and neuropathies. The risks of buttock pain, intractable dyspareunia, and vaginal narrowing and shortening with sequelae were discussed. Bleeding risks, transfusion rates, and infection were discussed. The risk of persistent, de novo, or worsening bladder and/or bowel incontinence/dysfunction was discussed. The need for CIC was described as well the usual post-operative course. The patient understands that she might not  reach her treatment goal and that she might be worse following surgery.      After a thorough review of the management options for the patient's condition the patient  elected to proceed with surgical therapy as noted above. We have discussed the potential benefits and risks of the procedure, side effects of the proposed treatment, the likelihood of the patient achieving the goals of the procedure, and any potential problems that might occur during the procedure or recuperation. Informed consent has been obtained.

## 2019-12-21 MED ORDER — GENTAMICIN SULFATE 40 MG/ML IJ SOLN
5.0000 mg/kg | INTRAVENOUS | Status: AC
  Administered 2019-12-22: 370 mg via INTRAVENOUS
  Filled 2019-12-21: qty 9.25

## 2019-12-21 NOTE — Anesthesia Preprocedure Evaluation (Addendum)
Anesthesia Evaluation  Patient identified by MRN, date of birth, ID band Patient awake    Reviewed: Allergy & Precautions, NPO status   Airway Mallampati: II  TM Distance: >3 FB     Dental   Pulmonary    breath sounds clear to auscultation       Cardiovascular hypertension, + CAD   Rhythm:Regular Rate:Normal     Neuro/Psych    GI/Hepatic Neg liver ROS, GERD  ,  Endo/Other    Renal/GU negative Renal ROS     Musculoskeletal   Abdominal   Peds  Hematology   Anesthesia Other Findings   Reproductive/Obstetrics                            Anesthesia Physical Anesthesia Plan  ASA: III  Anesthesia Plan: General   Post-op Pain Management:    Induction: Intravenous  PONV Risk Score and Plan: 3 and Ondansetron, Dexamethasone and Midazolam  Airway Management Planned: Oral ETT  Additional Equipment:   Intra-op Plan:   Post-operative Plan: Extubation in OR  Informed Consent: I have reviewed the patients History and Physical, chart, labs and discussed the procedure including the risks, benefits and alternatives for the proposed anesthesia with the patient or authorized representative who has indicated his/her understanding and acceptance.     Dental advisory given  Plan Discussed with: Anesthesiologist and CRNA  Anesthesia Plan Comments:        Anesthesia Quick Evaluation

## 2019-12-22 ENCOUNTER — Encounter (HOSPITAL_COMMUNITY)
Admission: AD | Disposition: A | Discharge status: Home / Self Care | Payer: Self-pay | Source: Home / Self Care | Attending: Urology

## 2019-12-22 ENCOUNTER — Other Ambulatory Visit: Payer: Self-pay

## 2019-12-22 ENCOUNTER — Ambulatory Visit (HOSPITAL_COMMUNITY): Payer: Medicare HMO | Admitting: Physician Assistant

## 2019-12-22 ENCOUNTER — Encounter (HOSPITAL_COMMUNITY): Payer: Self-pay | Admitting: Urology

## 2019-12-22 ENCOUNTER — Ambulatory Visit (HOSPITAL_COMMUNITY): Payer: Medicare HMO | Admitting: Certified Registered"

## 2019-12-22 ENCOUNTER — Observation Stay (HOSPITAL_COMMUNITY)
Admission: AD | Admit: 2019-12-22 | Discharge: 2019-12-23 | Disposition: A | Discharge status: Home / Self Care | Payer: Medicare HMO | Attending: Urology | Admitting: Urology

## 2019-12-22 DIAGNOSIS — Z79899 Other long term (current) drug therapy: Secondary | ICD-10-CM | POA: Diagnosis not present

## 2019-12-22 DIAGNOSIS — I2510 Atherosclerotic heart disease of native coronary artery without angina pectoris: Secondary | ICD-10-CM | POA: Diagnosis not present

## 2019-12-22 DIAGNOSIS — N816 Rectocele: Secondary | ICD-10-CM | POA: Diagnosis not present

## 2019-12-22 DIAGNOSIS — M797 Fibromyalgia: Secondary | ICD-10-CM | POA: Diagnosis not present

## 2019-12-22 DIAGNOSIS — K219 Gastro-esophageal reflux disease without esophagitis: Secondary | ICD-10-CM | POA: Insufficient documentation

## 2019-12-22 DIAGNOSIS — I10 Essential (primary) hypertension: Secondary | ICD-10-CM | POA: Insufficient documentation

## 2019-12-22 DIAGNOSIS — M1990 Unspecified osteoarthritis, unspecified site: Secondary | ICD-10-CM | POA: Diagnosis not present

## 2019-12-22 DIAGNOSIS — N8189 Other female genital prolapse: Secondary | ICD-10-CM | POA: Diagnosis not present

## 2019-12-22 DIAGNOSIS — N8110 Cystocele, unspecified: Secondary | ICD-10-CM | POA: Diagnosis not present

## 2019-12-22 DIAGNOSIS — N819 Female genital prolapse, unspecified: Secondary | ICD-10-CM | POA: Insufficient documentation

## 2019-12-22 HISTORY — PX: VAGINAL PROLAPSE REPAIR: SHX830

## 2019-12-22 LAB — TYPE AND SCREEN
ABO/RH(D): O POS
Antibody Screen: NEGATIVE

## 2019-12-22 LAB — HEMOGLOBIN AND HEMATOCRIT, BLOOD
HCT: 38.8 % (ref 36.0–46.0)
Hemoglobin: 12.4 g/dL (ref 12.0–15.0)

## 2019-12-22 SURGERY — SUSPENSION, VAGINAL VAULT
Anesthesia: General | Site: Vagina

## 2019-12-22 MED ORDER — ATORVASTATIN CALCIUM 20 MG PO TABS
20.0000 mg | ORAL_TABLET | Freq: Every day | ORAL | Status: DC
  Administered 2019-12-22: 20 mg via ORAL
  Filled 2019-12-22: qty 1

## 2019-12-22 MED ORDER — DEXAMETHASONE SODIUM PHOSPHATE 10 MG/ML IJ SOLN
INTRAMUSCULAR | Status: AC
  Filled 2019-12-22: qty 1

## 2019-12-22 MED ORDER — ONDANSETRON HCL 4 MG/2ML IJ SOLN: 4.0000 mg | INTRAMUSCULAR | Status: DC | PRN

## 2019-12-22 MED ORDER — HYDROCODONE-ACETAMINOPHEN 5-325 MG PO TABS
ORAL_TABLET | ORAL | Status: AC
  Filled 2019-12-22: qty 2

## 2019-12-22 MED ORDER — 0.9 % SODIUM CHLORIDE (POUR BTL) OPTIME
TOPICAL | Status: DC | PRN
  Administered 2019-12-22: 1000 mL

## 2019-12-22 MED ORDER — DEXTROSE-NACL 5-0.45 % IV SOLN: INTRAVENOUS | Status: DC

## 2019-12-22 MED ORDER — CLINDAMYCIN PHOSPHATE 2 % VA CREA
TOPICAL_CREAM | VAGINAL | Status: AC
  Filled 2019-12-22: qty 40

## 2019-12-22 MED ORDER — LOSARTAN POTASSIUM-HCTZ 50-12.5 MG PO TABS: 1.0000 | ORAL_TABLET | Freq: Every day | ORAL | Status: DC

## 2019-12-22 MED ORDER — PROPOFOL 10 MG/ML IV BOLUS
INTRAVENOUS | Status: DC | PRN
  Administered 2019-12-22: 150 mg via INTRAVENOUS

## 2019-12-22 MED ORDER — LIDOCAINE 2% (20 MG/ML) 5 ML SYRINGE
INTRAMUSCULAR | Status: DC | PRN
  Administered 2019-12-22: 80 mg via INTRAVENOUS

## 2019-12-22 MED ORDER — PHENAZOPYRIDINE HCL 200 MG PO TABS
200.0000 mg | ORAL_TABLET | Freq: Once | ORAL | Status: AC
  Administered 2019-12-22: 200 mg via ORAL
  Filled 2019-12-22: qty 1

## 2019-12-22 MED ORDER — ROCURONIUM BROMIDE 10 MG/ML (PF) SYRINGE
PREFILLED_SYRINGE | INTRAVENOUS | Status: DC | PRN
  Administered 2019-12-22: 10 mg via INTRAVENOUS
  Administered 2019-12-22: 20 mg via INTRAVENOUS
  Administered 2019-12-22: 40 mg via INTRAVENOUS

## 2019-12-22 MED ORDER — LIDOCAINE-EPINEPHRINE (PF) 1 %-1:200000 IJ SOLN
INTRAMUSCULAR | Status: DC | PRN
  Administered 2019-12-22: 18 mg

## 2019-12-22 MED ORDER — DEXAMETHASONE SODIUM PHOSPHATE 10 MG/ML IJ SOLN
INTRAMUSCULAR | Status: DC | PRN
  Administered 2019-12-22: 4 mg via INTRAVENOUS

## 2019-12-22 MED ORDER — FENTANYL CITRATE (PF) 100 MCG/2ML IJ SOLN
INTRAMUSCULAR | Status: AC
  Filled 2019-12-22: qty 2

## 2019-12-22 MED ORDER — CLINDAMYCIN PHOSPHATE 600 MG/50ML IV SOLN
600.0000 mg | INTRAVENOUS | Status: AC
  Administered 2019-12-22: 600 mg via INTRAVENOUS
  Filled 2019-12-22: qty 50

## 2019-12-22 MED ORDER — CHLORHEXIDINE GLUCONATE CLOTH 2 % EX PADS
6.0000 | MEDICATED_PAD | Freq: Every day | CUTANEOUS | Status: DC
  Administered 2019-12-22: 6 via TOPICAL

## 2019-12-22 MED ORDER — PHENYLEPHRINE HCL-NACL 10-0.9 MG/250ML-% IV SOLN
INTRAVENOUS | Status: DC | PRN
  Administered 2019-12-22: 20 ug/min via INTRAVENOUS

## 2019-12-22 MED ORDER — MIDAZOLAM HCL 2 MG/2ML IJ SOLN
INTRAMUSCULAR | Status: AC
  Filled 2019-12-22: qty 2

## 2019-12-22 MED ORDER — CLINDAMYCIN PHOSPHATE 2 % VA CREA
TOPICAL_CREAM | VAGINAL | Status: DC | PRN
  Administered 2019-12-22: 2 via VAGINAL

## 2019-12-22 MED ORDER — HYDROCHLOROTHIAZIDE 12.5 MG PO CAPS
12.5000 mg | ORAL_CAPSULE | Freq: Every day | ORAL | Status: DC
  Administered 2019-12-22: 12.5 mg via ORAL
  Filled 2019-12-22: qty 1

## 2019-12-22 MED ORDER — WATER FOR IRRIGATION, STERILE IR SOLN
Status: DC | PRN
  Administered 2019-12-22: 1000 mL via INTRAVESICAL

## 2019-12-22 MED ORDER — LIDOCAINE-EPINEPHRINE (PF) 1 %-1:200000 IJ SOLN
INTRAMUSCULAR | Status: AC
  Filled 2019-12-22: qty 30

## 2019-12-22 MED ORDER — FENTANYL CITRATE (PF) 100 MCG/2ML IJ SOLN
50.0000 ug | INTRAMUSCULAR | Status: AC | PRN
  Administered 2019-12-22 (×2): 50 ug via INTRAVENOUS

## 2019-12-22 MED ORDER — DIPHENHYDRAMINE HCL 50 MG/ML IJ SOLN: 12.5000 mg | Freq: Four times a day (QID) | INTRAMUSCULAR | Status: DC | PRN

## 2019-12-22 MED ORDER — DULOXETINE HCL 60 MG PO CPEP
60.0000 mg | ORAL_CAPSULE | Freq: Every day | ORAL | Status: DC
  Administered 2019-12-22: 60 mg via ORAL
  Filled 2019-12-22 (×2): qty 1

## 2019-12-22 MED ORDER — LACTATED RINGERS IV SOLN
INTRAVENOUS | Status: DC
  Administered 2019-12-22: 14:00:00 1000 mL via INTRAVENOUS

## 2019-12-22 MED ORDER — PROPOFOL 10 MG/ML IV BOLUS
INTRAVENOUS | Status: AC
  Filled 2019-12-22: qty 20

## 2019-12-22 MED ORDER — DOCUSATE SODIUM 100 MG PO CAPS
100.0000 mg | ORAL_CAPSULE | Freq: Two times a day (BID) | ORAL | Status: DC
  Administered 2019-12-22 – 2019-12-23 (×3): 100 mg via ORAL
  Filled 2019-12-22 (×3): qty 1

## 2019-12-22 MED ORDER — MIDAZOLAM HCL 2 MG/2ML IJ SOLN
INTRAMUSCULAR | Status: DC | PRN
  Administered 2019-12-22 (×2): 1 mg via INTRAVENOUS

## 2019-12-22 MED ORDER — SUCCINYLCHOLINE CHLORIDE 200 MG/10ML IV SOSY
PREFILLED_SYRINGE | INTRAVENOUS | Status: DC | PRN
  Administered 2019-12-22: 120 mg via INTRAVENOUS

## 2019-12-22 MED ORDER — METOPROLOL SUCCINATE ER 25 MG PO TB24
25.0000 mg | ORAL_TABLET | Freq: Every day | ORAL | Status: DC
  Administered 2019-12-22: 25 mg via ORAL
  Filled 2019-12-22: qty 1

## 2019-12-22 MED ORDER — HYDROCODONE-ACETAMINOPHEN 5-325 MG PO TABS
1.0000 | ORAL_TABLET | ORAL | Status: DC | PRN
  Administered 2019-12-22 (×2): 2 via ORAL
  Filled 2019-12-22: qty 2

## 2019-12-22 MED ORDER — DIPHENHYDRAMINE HCL 12.5 MG/5ML PO ELIX: 12.5000 mg | ORAL_SOLUTION | Freq: Four times a day (QID) | ORAL | Status: DC | PRN

## 2019-12-22 MED ORDER — SODIUM CHLORIDE 0.9 % IV SOLN
INTRAVENOUS | Status: DC | PRN
  Administered 2019-12-22: 500 mL

## 2019-12-22 MED ORDER — PHENYLEPHRINE 40 MCG/ML (10ML) SYRINGE FOR IV PUSH (FOR BLOOD PRESSURE SUPPORT)
PREFILLED_SYRINGE | INTRAVENOUS | Status: DC | PRN
  Administered 2019-12-22: 80 ug via INTRAVENOUS
  Administered 2019-12-22 (×3): 40 ug via INTRAVENOUS
  Administered 2019-12-22: 80 ug via INTRAVENOUS

## 2019-12-22 MED ORDER — ACETAMINOPHEN 325 MG PO TABS
650.0000 mg | ORAL_TABLET | ORAL | Status: DC | PRN
  Administered 2019-12-23: 650 mg via ORAL
  Filled 2019-12-22: qty 2

## 2019-12-22 MED ORDER — LOSARTAN POTASSIUM 50 MG PO TABS
50.0000 mg | ORAL_TABLET | Freq: Every day | ORAL | Status: DC
  Administered 2019-12-22: 50 mg via ORAL
  Filled 2019-12-22: qty 1

## 2019-12-22 MED ORDER — FENTANYL CITRATE (PF) 250 MCG/5ML IJ SOLN
INTRAMUSCULAR | Status: DC | PRN
  Administered 2019-12-22: 50 ug via INTRAVENOUS
  Administered 2019-12-22 (×2): 25 ug via INTRAVENOUS

## 2019-12-22 MED ORDER — HYDROCODONE-ACETAMINOPHEN 5-325 MG PO TABS: 1.0000 | ORAL_TABLET | Freq: Four times a day (QID) | ORAL | 0 refills | Status: DC | PRN

## 2019-12-22 MED ORDER — FENTANYL CITRATE (PF) 100 MCG/2ML IJ SOLN
25.0000 ug | INTRAMUSCULAR | Status: DC | PRN
  Administered 2019-12-22 (×3): 50 ug via INTRAVENOUS

## 2019-12-22 MED ORDER — LIDOCAINE 2% (20 MG/ML) 5 ML SYRINGE
INTRAMUSCULAR | Status: AC
  Filled 2019-12-22: qty 5

## 2019-12-22 MED ORDER — ONDANSETRON HCL 4 MG/2ML IJ SOLN
INTRAMUSCULAR | Status: AC
  Filled 2019-12-22: qty 2

## 2019-12-22 MED ORDER — PANTOPRAZOLE SODIUM 40 MG PO TBEC
40.0000 mg | DELAYED_RELEASE_TABLET | Freq: Every day | ORAL | Status: DC
  Administered 2019-12-22 – 2019-12-23 (×2): 40 mg via ORAL
  Filled 2019-12-22 (×2): qty 1

## 2019-12-22 MED ORDER — ONDANSETRON HCL 4 MG/2ML IJ SOLN
INTRAMUSCULAR | Status: DC | PRN
  Administered 2019-12-22: 4 mg via INTRAVENOUS

## 2019-12-22 MED ORDER — GABAPENTIN 300 MG PO CAPS
300.0000 mg | ORAL_CAPSULE | Freq: Three times a day (TID) | ORAL | Status: DC
  Administered 2019-12-22 – 2019-12-23 (×3): 300 mg via ORAL
  Filled 2019-12-22 (×3): qty 1

## 2019-12-22 MED ORDER — SUGAMMADEX SODIUM 200 MG/2ML IV SOLN
INTRAVENOUS | Status: DC | PRN
  Administered 2019-12-22: 150 mg via INTRAVENOUS

## 2019-12-22 MED ORDER — MORPHINE SULFATE (PF) 4 MG/ML IV SOLN
2.0000 mg | INTRAVENOUS | Status: DC | PRN
  Administered 2019-12-22: 2 mg via INTRAVENOUS
  Filled 2019-12-22: qty 1

## 2019-12-22 MED ORDER — OXYBUTYNIN CHLORIDE 5 MG PO TABS
5.0000 mg | ORAL_TABLET | Freq: Three times a day (TID) | ORAL | Status: DC | PRN
  Administered 2019-12-22: 5 mg via ORAL

## 2019-12-22 MED ORDER — OXYBUTYNIN CHLORIDE 5 MG PO TABS
ORAL_TABLET | ORAL | Status: AC
  Filled 2019-12-22: qty 1

## 2019-12-22 SURGICAL SUPPLY — 57 items
ALLOGRAFT TUTOPLAST AXIS 6X12 (Tissue) ×1 IMPLANT
BAG DECANTER FOR FLEXI CONT (MISCELLANEOUS) ×3 IMPLANT
BAG DRN RND TRDRP ANRFLXCHMBR (UROLOGICAL SUPPLIES)
BAG URINE DRAIN 2000ML AR STRL (UROLOGICAL SUPPLIES) IMPLANT
BLADE SURG 15 STRL LF DISP TIS (BLADE) ×2 IMPLANT
BLADE SURG 15 STRL SS (BLADE) ×3
BRIEF STRETCH FOR OB PAD LRG (UNDERPADS AND DIAPERS) ×3 IMPLANT
CATH FOLEY 2WAY SLVR  5CC 14FR (CATHETERS) ×3
CATH FOLEY 2WAY SLVR 5CC 14FR (CATHETERS) ×2 IMPLANT
COVER MAYO STAND STRL (DRAPES) ×3 IMPLANT
COVER SURGICAL LIGHT HANDLE (MISCELLANEOUS) ×3 IMPLANT
COVER WAND RF STERILE (DRAPES) IMPLANT
DECANTER SPIKE VIAL GLASS SM (MISCELLANEOUS) ×3 IMPLANT
DEVICE CAPIO SLIM SINGLE (INSTRUMENTS) ×2 IMPLANT
DRAIN PENROSE 0.25X18 (DRAIN) ×3 IMPLANT
DRAPE UNDERBUTTOCKS STRL (DISPOSABLE) ×3 IMPLANT
GAUZE 4X4 16PLY RFD (DISPOSABLE) ×6 IMPLANT
GAUZE PACKING 1 X5 YD ST (GAUZE/BANDAGES/DRESSINGS) ×6 IMPLANT
GLOVE BIO SURGEON STRL SZ 6.5 (GLOVE) ×3 IMPLANT
GLOVE BIOGEL M STRL SZ7.5 (GLOVE) ×3 IMPLANT
GLOVE ECLIPSE 8.5 STRL (GLOVE) ×3 IMPLANT
GOWN STRL REUS W/TWL XL LVL3 (GOWN DISPOSABLE) ×3 IMPLANT
HEMOSTAT SURGICEL 2X14 (HEMOSTASIS) ×4 IMPLANT
HOLDER FOLEY CATH W/STRAP (MISCELLANEOUS) ×3 IMPLANT
IV NS 1000ML (IV SOLUTION) ×3
IV NS 1000ML BAXH (IV SOLUTION) ×2 IMPLANT
KIT BASIN OR (CUSTOM PROCEDURE TRAY) ×3 IMPLANT
KIT TURNOVER KIT A (KITS) IMPLANT
NDL MAYO 6 CRC TAPER PT (NEEDLE) ×1 IMPLANT
NEEDLE HYPO 22GX1.5 SAFETY (NEEDLE) ×3 IMPLANT
NEEDLE MAYO 6 CRC TAPER PT (NEEDLE) ×3 IMPLANT
NS IRRIG 1000ML POUR BTL (IV SOLUTION) ×3 IMPLANT
PACK CYSTO (CUSTOM PROCEDURE TRAY) ×3 IMPLANT
PACKING VAGINAL (PACKING) ×2 IMPLANT
PAD OB MATERNITY 4.3X12.25 (PERSONAL CARE ITEMS) ×3 IMPLANT
PLUG CATH AND CAP STER (CATHETERS) ×3 IMPLANT
PROTECTOR NERVE ULNAR (MISCELLANEOUS) ×3 IMPLANT
RETRACTOR STAY HOOK 5MM (MISCELLANEOUS) ×5 IMPLANT
SHEET LAVH (DRAPES) ×3 IMPLANT
SUT CAPIO ETHIBPND (SUTURE) ×6 IMPLANT
SUT VIC AB 0 CT1 27 (SUTURE) ×3
SUT VIC AB 0 CT1 27XBRD ANTBC (SUTURE) ×2 IMPLANT
SUT VIC AB 2-0 CT1 27 (SUTURE) ×6
SUT VIC AB 2-0 CT1 27XBRD (SUTURE) ×4 IMPLANT
SUT VIC AB 2-0 SH 27 (SUTURE) ×18
SUT VIC AB 2-0 SH 27X BRD (SUTURE) ×8 IMPLANT
SUT VIC AB 3-0 SH 27 (SUTURE) ×6
SUT VIC AB 3-0 SH 27XBRD (SUTURE) ×4 IMPLANT
SUT VICRYL 0 UR6 27IN ABS (SUTURE) ×6 IMPLANT
SYR 10ML LL (SYRINGE) ×3 IMPLANT
TOWEL OR 17X26 10 PK STRL BLUE (TOWEL DISPOSABLE) ×3 IMPLANT
TOWEL OR NON WOVEN STRL DISP B (DISPOSABLE) ×3 IMPLANT
TUBING CONNECTING 10 (TUBING) ×3 IMPLANT
TUTOPLAST AXIS 6X12 (Tissue) ×3 IMPLANT
UNDERPAD 30X36 HEAVY ABSORB (UNDERPADS AND DIAPERS) ×3 IMPLANT
WATER STERILE IRR 1000ML POUR (IV SOLUTION) ×3 IMPLANT
YANKAUER SUCT BULB TIP 10FT TU (MISCELLANEOUS) ×3 IMPLANT

## 2019-12-22 NOTE — Progress Notes (Signed)
Patient stated that she had a headache so she took her losartan-hydrochlorothiazide and metoprolol that she had brought with her from home because she felt her BP was high.  When patient arrived to unit it was clearly stated via interpreter to patient that she was to send medications home with her daughter and not take any of her own medications, and patient verbalized understanding of this at that time.  Metoprolol and Hyzaar administered by RN at approx 1615.  Patient took her own home medications at approximately 1730.  Dr. Berneice Heinrich on call, notified.  Patient's vital signs currently stable, patient is alert, oriented.  Will closely monitor throughout the night and to call Dr. Berneice Heinrich for further orders if patient is hypotensive or bradycardic.  Patient's daughter at bedside and verbalized understanding of hospital policy, will take patient's medications home with her tonight.

## 2019-12-22 NOTE — Interval H&P Note (Signed)
History and Physical Interval Note:  12/22/2019 10:21 AM  Amy Cline  has presented today for surgery, with the diagnosis of rectocele vault prolapse possible enterocele.  The various methods of treatment have been discussed with the patient and family. After consideration of risks, benefits and other options for treatment, the patient has consented to  Procedure(s): RECTOCELE REPAIR WITH GRAFT AND VAGINAL VAULT SUSPENSION/CYSTOSCOPY (N/A) as a surgical intervention.  The patient's history has been reviewed, patient examined, no change in status, stable for surgery.  I have reviewed the patient's chart and labs.  Questions were answered to the patient's satisfaction.     Mira Balon A Masai Kidd

## 2019-12-22 NOTE — Op Note (Signed)
Preoperative diagnosis: Rectocele and vault prolapse Postoperative diagnosis: Rectocele and vault prolapse Surgery: Rectocele repair and graft and vault prolapse repair and cystoscopy Surgeon: Dr. Lorin Picket Christipher Rieger Assistant: Marchelle Folks dancy  The patient has the above diagnosis and consented the above procedure.  Extra care was taken leg positioning to minimize the risk of compartment syndrome and neuropathy and deep vein thrombosis.  Patient had mild narrowing of the vaginal vault.  She had minimal vaginal shortening.  There is no question she had an apical defect with shiny bulging tissue at the apex mainly posteriorly.  I was very diligent trying to find the vaginal apical dimples and placing a 3-0 Vicryl in appropriate area.  She had a short anterior vaginal wall relative what I felt was the apex relative to the bladder.  I used my double ring retractor because a very narrow anatomy at the beginning of the case at the level of the introitus  I instilled 20 cc of lidocaine epinephrine mixture to help develop the plane.  I removed a small triangle of perineal skin.  I made a long posterior vaginal wall incision and sharply mobilized the overlying vaginal epithelium from the underlying pubocervical fascia to the white line bilaterally.  I was very pleased with my apical mobilization in total.  I did a rectal examination and she had a lot of thinning of her rectovaginal fascia especially in the upper half and apex.  At the apex the bulging area was rectum and not enterocele.  I did a 2 layer posterior repair with running 2-0 Vicryl on SH needle.  I was very careful at the apex with the thinning of the tissue.  I was exceptionally pleased with the robustness of the posterior repair and it reduced rectocele.  I repeated a rectal examination and no rectal injury.  Good rectal repair.  No suture in the rectum  The 10 x 6 biologic graft was prepared in the usual fashion in the shape of a trapezoid  With the  appropriate technique I followed the tissue plane down to the ischial spine bilaterally.  I mobilized tissue medially.  With the Capio device I placed a 0 Vicryl 1 full fingerbreadth medial to the spine and a little bit caudal to the spine.  I triple checked the position and was pleased with it.  I repeated the rectal examination and sutures were outside the rectum.  Graft was sewn in place not under tension.  I fashioned the graft distally to rectovaginal fascia with interrupted 2-0 Vicryl.  It laid in very nicely  I cystoscoped the patient and she had excellent efflux bilaterally.  No bladder injury.  At the apex in the midline a small bubble of tissue wanting to protrude minimally so I carefully approximated the cephalad edge of the graft in the midline to the vaginal epithelium with 3 interrupted sutures with no injury.  I only trimmed a few millimeters of vaginal epithelium and closed the posterior vaginal wall with running 2-0 Vicryl on a CT1 needle.  I did 1 gentle 0 Vicryl on a CT1 needle perineal repair.  I closed th perineum with subcuticular 2-0 Vicryl.  I took extra care to make certain cosmetic repair was very good.  At the end of the case she had excellent length.  No narrowing.  No narrowing at introitus.  Blood loss less than 50 mL.  There was a bit of oozing from the right ischial spine area so some Surgicel was used on the right elbow an  less of the left before closing.  Vaginal pack x2 with clindamycin cream was applied.  Leg position good.  I was very pleased with surgery and hopefully the procedure will reach her treatment goal

## 2019-12-22 NOTE — Transfer of Care (Signed)
Immediate Anesthesia Transfer of Care Note  Patient: Amy Cline  Procedure(s) Performed: RECTOCELE REPAIR WITH GRAFT AND VAGINAL VAULT SUSPENSION/CYSTOSCOPY (N/A Vagina )  Patient Location: PACU  Anesthesia Type:General  Level of Consciousness: awake, drowsy and patient cooperative  Airway & Oxygen Therapy: Patient Spontanous Breathing and Patient connected to face mask oxygen  Post-op Assessment: Report given to RN and Post -op Vital signs reviewed and stable  Post vital signs: Reviewed and stable  Last Vitals:  Vitals Value Taken Time  BP 117/73 12/22/19 1026  Temp    Pulse 84 12/22/19 1027  Resp 15 12/22/19 1027  SpO2 100 % 12/22/19 1027  Vitals shown include unvalidated device data.  Last Pain:  Vitals:   12/22/19 0626  TempSrc:   PainSc: 0-No pain         Complications: No apparent anesthesia complications

## 2019-12-22 NOTE — Progress Notes (Signed)
Sore rt vagina No belly pain- soft Vitals and labs normal Detailed surgery

## 2019-12-22 NOTE — Anesthesia Procedure Notes (Signed)
Procedure Name: Intubation Date/Time: 12/22/2019 7:45 AM Performed by: Eben Burow, CRNA Pre-anesthesia Checklist: Patient identified, Emergency Drugs available, Suction available, Patient being monitored and Timeout performed Patient Re-evaluated:Patient Re-evaluated prior to induction Oxygen Delivery Method: Circle system utilized Preoxygenation: Pre-oxygenation with 100% oxygen Induction Type: IV induction Ventilation: Mask ventilation without difficulty Laryngoscope Size: Mac and 4 Grade View: Grade I Tube type: Oral Tube size: 7.0 mm Number of attempts: 1 Airway Equipment and Method: Stylet Placement Confirmation: ETT inserted through vocal cords under direct vision,  positive ETCO2 and breath sounds checked- equal and bilateral Secured at: 21 cm Tube secured with: Tape Dental Injury: Teeth and Oropharynx as per pre-operative assessment

## 2019-12-23 ENCOUNTER — Encounter: Payer: Self-pay | Admitting: *Deleted

## 2019-12-23 DIAGNOSIS — I10 Essential (primary) hypertension: Secondary | ICD-10-CM | POA: Diagnosis not present

## 2019-12-23 DIAGNOSIS — N816 Rectocele: Secondary | ICD-10-CM | POA: Diagnosis not present

## 2019-12-23 DIAGNOSIS — M797 Fibromyalgia: Secondary | ICD-10-CM | POA: Diagnosis not present

## 2019-12-23 DIAGNOSIS — M1990 Unspecified osteoarthritis, unspecified site: Secondary | ICD-10-CM | POA: Diagnosis not present

## 2019-12-23 DIAGNOSIS — K219 Gastro-esophageal reflux disease without esophagitis: Secondary | ICD-10-CM | POA: Diagnosis not present

## 2019-12-23 DIAGNOSIS — Z79899 Other long term (current) drug therapy: Secondary | ICD-10-CM | POA: Diagnosis not present

## 2019-12-23 DIAGNOSIS — N819 Female genital prolapse, unspecified: Secondary | ICD-10-CM | POA: Diagnosis not present

## 2019-12-23 DIAGNOSIS — I2510 Atherosclerotic heart disease of native coronary artery without angina pectoris: Secondary | ICD-10-CM | POA: Diagnosis not present

## 2019-12-23 LAB — BASIC METABOLIC PANEL
Anion gap: 5 (ref 5–15)
BUN: 13 mg/dL (ref 8–23)
CO2: 25 mmol/L (ref 22–32)
Calcium: 8.4 mg/dL — ABNORMAL LOW (ref 8.9–10.3)
Chloride: 106 mmol/L (ref 98–111)
Creatinine, Ser: 0.67 mg/dL (ref 0.44–1.00)
GFR calc Af Amer: >60 mL/min (ref >60)
GFR calc non Af Amer: >60 mL/min (ref >60)
Glucose, Bld: 151 mg/dL — ABNORMAL HIGH (ref 70–99)
Potassium: 4 mmol/L (ref 3.5–5.1)
Sodium: 136 mmol/L (ref 135–145)

## 2019-12-23 LAB — HEMOGLOBIN AND HEMATOCRIT, BLOOD
HCT: 35.3 % — ABNORMAL LOW (ref 36.0–46.0)
Hemoglobin: 11.3 g/dL — ABNORMAL LOW (ref 12.0–15.0)

## 2019-12-23 NOTE — Anesthesia Postprocedure Evaluation (Signed)
Anesthesia Post Note  Patient: Amy Cline  Procedure(s) Performed: RECTOCELE REPAIR WITH GRAFT AND VAGINAL VAULT SUSPENSION/CYSTOSCOPY (N/A Vagina )     Patient location during evaluation: PACU Anesthesia Type: General Level of consciousness: awake Pain management: pain level controlled Vital Signs Assessment: post-procedure vital signs reviewed and stable Respiratory status: spontaneous breathing Cardiovascular status: stable Postop Assessment: no apparent nausea or vomiting Anesthetic complications: no    Last Vitals:  Vitals:   12/23/19 0527 12/23/19 1152  BP: 116/70 115/77  Pulse: 80 78  Resp: 18 18  Temp: 36.6 C   SpO2: 92% 94%    Last Pain:  Vitals:   12/23/19 0809  TempSrc:   PainSc: 0-No pain                 Pascal Stiggers

## 2019-12-23 NOTE — Progress Notes (Signed)
Dr. Sherron Monday notified of patient's voids.  Last void 200 ml with bladder scan showing 69 ml post void residual and notified of patient's blood pressure.  Dr. Sherron Monday stated patient may be discharged and the office will call her tomorrow.

## 2019-12-23 NOTE — Discharge Summary (Signed)
Date of admission: 12/22/2019  Date of discharge: 12/23/2019  Admission diagnosis: rectocele  Discharge diagnosis: rectocele  Secondary diagnoses: vault prolapse  History and Physical: For full details, please see admission history and physical. Briefly, Amy Cline is a 64 y.o. year old patient with above diagnosis.   Hospital Course: Rectocele repair and graft and vault prolapse repair; good post op course  Laboratory values:  Recent Labs    12/22/19 1619 12/23/19 0514  HGB 12.4 11.3*  HCT 38.8 35.3*   Recent Labs    12/23/19 0514  CREATININE 0.67    Disposition: Home  Discharge instruction: The patient was instructed to be ambulatory but told to refrain from heavy lifting, strenuous activity, or driving. detailed  Discharge medications:  Allergies as of 12/23/2019   No Known Allergies     Medication List    STOP taking these medications   ibuprofen 600 MG tablet Commonly known as: ADVIL   SUPER B COMPLEX PO   Vitamin D-3 125 MCG (5000 UT) Tabs     TAKE these medications   alendronate 35 MG tablet Commonly known as: FOSAMAX Take 1 tablet (35 mg total) by mouth every 7 (seven) days. Take with a full glass of water on an empty stomach.   atorvastatin 20 MG tablet Commonly known as: LIPITOR Take 1 tablet (20 mg total) by mouth daily.   carboxymethylcellulose 0.5 % Soln Commonly known as: REFRESH PLUS 1 drop as needed.   DULoxetine 60 MG capsule Commonly known as: CYMBALTA TAKE 1 CAPSULE BY MOUTH EVERY DAY What changed:   how much to take  how to take this  when to take this  additional instructions   gabapentin 300 MG capsule Commonly known as: NEURONTIN Take 1 capsule (300 mg total) by mouth 3 (three) times daily.   HYDROcodone-acetaminophen 5-325 MG tablet Commonly known as: Norco Take 1-2 tablets by mouth every 6 (six) hours as needed for moderate pain.   losartan-hydrochlorothiazide 50-12.5 MG tablet Commonly known as: HYZAAR Take 1  tablet by mouth daily.   metoprolol succinate 25 MG 24 hr tablet Commonly known as: TOPROL-XL Take 25 mg by mouth daily.   omeprazole 20 MG capsule Commonly known as: PRILOSEC TAKE 1 CAPSULE BY MOUTH EVERY DAY. Please make PCP appointment. What changed:   how much to take  how to take this  when to take this  additional instructions   ondansetron 4 MG disintegrating tablet Commonly known as: ZOFRAN-ODT Take 1 tablet (4 mg total) by mouth every 8 (eight) hours as needed for nausea.   sulfamethoxazole-trimethoprim 800-160 MG tablet Commonly known as: BACTRIM DS Take 1 tablet by mouth 2 (two) times daily.   SUMAtriptan 100 MG tablet Commonly known as: IMITREX Take 1 tablet (100 mg total) by mouth once as needed for up to 1 dose. May repeat in 2 hours if headache persists or recurs.   triamcinolone cream 0.1 % Commonly known as: KENALOG Apply 1 application topically 2 (two) times daily. What changed:   when to take this  reasons to take this       Followup:  Follow-up Information    Donelle Hise, Lorin Picket, MD.   Specialty: Urology Why: office will call with date and time of appt.  Contact information: [address] [place] [address] [phone]

## 2019-12-23 NOTE — Progress Notes (Signed)
Interpreters used during shift [id number] [place], [id number] [place], [phone] Ana.

## 2019-12-23 NOTE — Progress Notes (Signed)
Looks good Labs and vitals OK Post op described

## 2019-12-23 NOTE — Progress Notes (Signed)
Patient's foley catheter and vaginal packing removed this morning per order.  Patient voided twice 100 ml and 200 ml.  Dr. Sherron Monday notified and will need to check residual after next void.

## 2019-12-23 NOTE — Discharge Instructions (Signed)
I have reviewed discharge instructions in detail with the patient. They will follow-up with me or their physician as scheduled. My nurse will also be calling the patients as per protocol. As discussed with Dr. Dartagnan Beavers. ° °You may resume aspirin, advil, aleve, vitamins, and supplements 7 days after surgery. °

## 2019-12-23 NOTE — Progress Notes (Signed)
Patient's IV removed.  Site WNL.  AVS reviewed with patient and patient's daughter.  Verbalized understanding of discharge instruction, physician follow-up, medications.  Dr. Sherron Monday notified of patient's request for work note.  Patient's daughter stated they had taken the paperwork by the office previously and took a new set by the office today for completion.  Patient transported by wheelchair to main entrance at discharge.  Patient stable at time of discharge.

## 2019-12-24 ENCOUNTER — Telehealth: Payer: Self-pay

## 2019-12-24 NOTE — Telephone Encounter (Signed)
Transition Care Management Follow-up Telephone Call Date of discharge and from where: 12/23/2019 from [facility]  Pt was not available at this time / spoke to her daughter Salome Arnt / Name and DOB verified  As per daughter pt PCP is at [facility] Dr Morrie Sheldon for the past year /  Daughter not pleased  with  the care provided by Ms Ramon Dredge at [facility]. Removed listed PCP from Epic.

## 2019-12-30 DIAGNOSIS — N816 Rectocele: Secondary | ICD-10-CM | POA: Diagnosis not present

## 2019-12-30 DIAGNOSIS — R350 Frequency of micturition: Secondary | ICD-10-CM | POA: Diagnosis not present

## 2020-01-12 ENCOUNTER — Ambulatory Visit: Payer: Medicare HMO | Admitting: Gastroenterology

## 2020-01-12 DIAGNOSIS — N816 Rectocele: Secondary | ICD-10-CM | POA: Diagnosis not present

## 2020-01-12 DIAGNOSIS — R350 Frequency of micturition: Secondary | ICD-10-CM | POA: Diagnosis not present

## 2020-01-18 DIAGNOSIS — N8110 Cystocele, unspecified: Secondary | ICD-10-CM | POA: Diagnosis not present

## 2020-01-18 DIAGNOSIS — E559 Vitamin D deficiency, unspecified: Secondary | ICD-10-CM | POA: Diagnosis not present

## 2020-01-18 DIAGNOSIS — I10 Essential (primary) hypertension: Secondary | ICD-10-CM | POA: Diagnosis not present

## 2020-01-18 DIAGNOSIS — R002 Palpitations: Secondary | ICD-10-CM | POA: Diagnosis not present

## 2020-01-18 DIAGNOSIS — K219 Gastro-esophageal reflux disease without esophagitis: Secondary | ICD-10-CM | POA: Diagnosis not present

## 2020-01-18 DIAGNOSIS — E785 Hyperlipidemia, unspecified: Secondary | ICD-10-CM | POA: Diagnosis not present

## 2020-01-18 DIAGNOSIS — M797 Fibromyalgia: Secondary | ICD-10-CM | POA: Diagnosis not present

## 2020-01-18 DIAGNOSIS — M810 Age-related osteoporosis without current pathological fracture: Secondary | ICD-10-CM | POA: Diagnosis not present

## 2020-02-19 ENCOUNTER — Other Ambulatory Visit (INDEPENDENT_AMBULATORY_CARE_PROVIDER_SITE_OTHER): Payer: Self-pay | Admitting: Primary Care

## 2020-02-19 DIAGNOSIS — E785 Hyperlipidemia, unspecified: Secondary | ICD-10-CM

## 2020-04-01 DIAGNOSIS — I252 Old myocardial infarction: Secondary | ICD-10-CM | POA: Diagnosis not present

## 2020-04-01 DIAGNOSIS — G43909 Migraine, unspecified, not intractable, without status migrainosus: Secondary | ICD-10-CM | POA: Diagnosis not present

## 2020-04-01 DIAGNOSIS — M1990 Unspecified osteoarthritis, unspecified site: Secondary | ICD-10-CM | POA: Diagnosis not present

## 2020-04-01 DIAGNOSIS — I10 Essential (primary) hypertension: Secondary | ICD-10-CM | POA: Diagnosis not present

## 2020-04-01 DIAGNOSIS — K219 Gastro-esophageal reflux disease without esophagitis: Secondary | ICD-10-CM | POA: Diagnosis not present

## 2020-04-01 DIAGNOSIS — E785 Hyperlipidemia, unspecified: Secondary | ICD-10-CM | POA: Diagnosis not present

## 2020-04-01 DIAGNOSIS — M797 Fibromyalgia: Secondary | ICD-10-CM | POA: Diagnosis not present

## 2020-04-01 DIAGNOSIS — Z8249 Family history of ischemic heart disease and other diseases of the circulatory system: Secondary | ICD-10-CM | POA: Diagnosis not present

## 2020-04-01 DIAGNOSIS — Z833 Family history of diabetes mellitus: Secondary | ICD-10-CM | POA: Diagnosis not present

## 2020-04-01 DIAGNOSIS — Z87891 Personal history of nicotine dependence: Secondary | ICD-10-CM | POA: Diagnosis not present

## 2020-04-11 DIAGNOSIS — R002 Palpitations: Secondary | ICD-10-CM | POA: Diagnosis not present

## 2020-04-11 DIAGNOSIS — L309 Dermatitis, unspecified: Secondary | ICD-10-CM | POA: Diagnosis not present

## 2020-04-11 DIAGNOSIS — M810 Age-related osteoporosis without current pathological fracture: Secondary | ICD-10-CM | POA: Diagnosis not present

## 2020-04-11 DIAGNOSIS — E785 Hyperlipidemia, unspecified: Secondary | ICD-10-CM | POA: Diagnosis not present

## 2020-04-11 DIAGNOSIS — E559 Vitamin D deficiency, unspecified: Secondary | ICD-10-CM | POA: Diagnosis not present

## 2020-04-11 DIAGNOSIS — Z131 Encounter for screening for diabetes mellitus: Secondary | ICD-10-CM | POA: Diagnosis not present

## 2020-04-11 DIAGNOSIS — K219 Gastro-esophageal reflux disease without esophagitis: Secondary | ICD-10-CM | POA: Diagnosis not present

## 2020-04-11 DIAGNOSIS — I10 Essential (primary) hypertension: Secondary | ICD-10-CM | POA: Diagnosis not present

## 2020-04-11 DIAGNOSIS — M797 Fibromyalgia: Secondary | ICD-10-CM | POA: Diagnosis not present

## 2020-04-11 DIAGNOSIS — N8110 Cystocele, unspecified: Secondary | ICD-10-CM | POA: Diagnosis not present

## 2020-04-22 DIAGNOSIS — N819 Female genital prolapse, unspecified: Secondary | ICD-10-CM | POA: Diagnosis not present

## 2020-04-22 DIAGNOSIS — N390 Urinary tract infection, site not specified: Secondary | ICD-10-CM | POA: Diagnosis not present

## 2020-04-22 DIAGNOSIS — N816 Rectocele: Secondary | ICD-10-CM | POA: Diagnosis not present

## 2020-05-13 DIAGNOSIS — R350 Frequency of micturition: Secondary | ICD-10-CM | POA: Diagnosis not present

## 2020-06-02 ENCOUNTER — Other Ambulatory Visit (INDEPENDENT_AMBULATORY_CARE_PROVIDER_SITE_OTHER): Payer: Self-pay | Admitting: Primary Care

## 2020-06-02 DIAGNOSIS — E785 Hyperlipidemia, unspecified: Secondary | ICD-10-CM

## 2020-06-02 NOTE — Telephone Encounter (Signed)
Requested Prescriptions  Pending Prescriptions Disp Refills   atorvastatin (LIPITOR) 20 MG tablet [Pharmacy Med Name: ATORVASTATIN 20 MG TABLET] 90 tablet 0    Sig: TAKE 1 TABLET BY MOUTH EVERY DAY     Cardiovascular:  Antilipid - Statins Failed - 06/02/2020  1:43 AM      Failed - LDL in normal range and within 360 days    LDL Chol Calc (NIH)  Date Value Ref Range Status  07/02/2019 63 0 - 99 mg/dL Final         Failed - Triglycerides in normal range and within 360 days    Triglycerides  Date Value Ref Range Status  07/02/2019 201 (H) 0 - 149 mg/dL Final         Passed - Total Cholesterol in normal range and within 360 days    Cholesterol, Total  Date Value Ref Range Status  07/02/2019 148 100 - 199 mg/dL Final         Passed - HDL in normal range and within 360 days    HDL  Date Value Ref Range Status  07/02/2019 52 >39 mg/dL Final         Passed - Patient is not pregnant      Passed - Valid encounter within last 12 months    Recent Outpatient Visits          11 months ago Essential hypertension   [facility] Grayce Sessions, NP   11 months ago 2019 novel coronavirus disease (COVID-19)   [facility] Grayce Sessions, NP   1 year ago Well woman exam   [facility] Grayce Sessions, NP   1 year ago Fibromyalgia   [facility] [place], Kinnie Scales, NP   1 year ago Essential hypertension   [facility] Grayce Sessions, NP             Reminder for Patient needs to schedule an appointment for office visit and labs.

## 2020-06-07 DIAGNOSIS — M797 Fibromyalgia: Secondary | ICD-10-CM | POA: Diagnosis not present

## 2020-06-07 DIAGNOSIS — K219 Gastro-esophageal reflux disease without esophagitis: Secondary | ICD-10-CM | POA: Diagnosis not present

## 2020-06-07 DIAGNOSIS — N8110 Cystocele, unspecified: Secondary | ICD-10-CM | POA: Diagnosis not present

## 2020-06-07 DIAGNOSIS — M2550 Pain in unspecified joint: Secondary | ICD-10-CM | POA: Diagnosis not present

## 2020-06-07 DIAGNOSIS — M810 Age-related osteoporosis without current pathological fracture: Secondary | ICD-10-CM | POA: Diagnosis not present

## 2020-06-07 DIAGNOSIS — R002 Palpitations: Secondary | ICD-10-CM | POA: Diagnosis not present

## 2020-06-07 DIAGNOSIS — L309 Dermatitis, unspecified: Secondary | ICD-10-CM | POA: Diagnosis not present

## 2020-06-07 DIAGNOSIS — E559 Vitamin D deficiency, unspecified: Secondary | ICD-10-CM | POA: Diagnosis not present

## 2020-06-07 DIAGNOSIS — E785 Hyperlipidemia, unspecified: Secondary | ICD-10-CM | POA: Diagnosis not present

## 2020-06-07 DIAGNOSIS — I10 Essential (primary) hypertension: Secondary | ICD-10-CM | POA: Diagnosis not present

## 2020-06-08 DIAGNOSIS — M9903 Segmental and somatic dysfunction of lumbar region: Secondary | ICD-10-CM | POA: Diagnosis not present

## 2020-06-08 DIAGNOSIS — M9904 Segmental and somatic dysfunction of sacral region: Secondary | ICD-10-CM | POA: Diagnosis not present

## 2020-06-08 DIAGNOSIS — M9905 Segmental and somatic dysfunction of pelvic region: Secondary | ICD-10-CM | POA: Diagnosis not present

## 2020-06-08 DIAGNOSIS — M5136 Other intervertebral disc degeneration, lumbar region: Secondary | ICD-10-CM | POA: Diagnosis not present

## 2020-06-13 ENCOUNTER — Other Ambulatory Visit: Payer: Self-pay | Admitting: Internal Medicine

## 2020-06-13 DIAGNOSIS — M9904 Segmental and somatic dysfunction of sacral region: Secondary | ICD-10-CM | POA: Diagnosis not present

## 2020-06-13 DIAGNOSIS — M9903 Segmental and somatic dysfunction of lumbar region: Secondary | ICD-10-CM | POA: Diagnosis not present

## 2020-06-13 DIAGNOSIS — M5136 Other intervertebral disc degeneration, lumbar region: Secondary | ICD-10-CM | POA: Diagnosis not present

## 2020-06-13 DIAGNOSIS — M9905 Segmental and somatic dysfunction of pelvic region: Secondary | ICD-10-CM | POA: Diagnosis not present

## 2020-06-13 DIAGNOSIS — Z13820 Encounter for screening for osteoporosis: Secondary | ICD-10-CM

## 2020-06-14 DIAGNOSIS — N8110 Cystocele, unspecified: Secondary | ICD-10-CM | POA: Diagnosis not present

## 2020-06-14 DIAGNOSIS — I10 Essential (primary) hypertension: Secondary | ICD-10-CM | POA: Diagnosis not present

## 2020-06-14 DIAGNOSIS — M2550 Pain in unspecified joint: Secondary | ICD-10-CM | POA: Diagnosis not present

## 2020-06-14 DIAGNOSIS — E559 Vitamin D deficiency, unspecified: Secondary | ICD-10-CM | POA: Diagnosis not present

## 2020-06-14 DIAGNOSIS — E785 Hyperlipidemia, unspecified: Secondary | ICD-10-CM | POA: Diagnosis not present

## 2020-06-14 DIAGNOSIS — R002 Palpitations: Secondary | ICD-10-CM | POA: Diagnosis not present

## 2020-06-14 DIAGNOSIS — M810 Age-related osteoporosis without current pathological fracture: Secondary | ICD-10-CM | POA: Diagnosis not present

## 2020-06-14 DIAGNOSIS — K219 Gastro-esophageal reflux disease without esophagitis: Secondary | ICD-10-CM | POA: Diagnosis not present

## 2020-06-14 DIAGNOSIS — M797 Fibromyalgia: Secondary | ICD-10-CM | POA: Diagnosis not present

## 2020-06-14 DIAGNOSIS — L309 Dermatitis, unspecified: Secondary | ICD-10-CM | POA: Diagnosis not present

## 2020-06-16 DIAGNOSIS — H2513 Age-related nuclear cataract, bilateral: Secondary | ICD-10-CM | POA: Diagnosis not present

## 2020-06-16 DIAGNOSIS — H5203 Hypermetropia, bilateral: Secondary | ICD-10-CM | POA: Diagnosis not present

## 2020-06-16 DIAGNOSIS — H35033 Hypertensive retinopathy, bilateral: Secondary | ICD-10-CM | POA: Diagnosis not present

## 2020-06-16 DIAGNOSIS — H40013 Open angle with borderline findings, low risk, bilateral: Secondary | ICD-10-CM | POA: Diagnosis not present

## 2020-06-21 DIAGNOSIS — I10 Essential (primary) hypertension: Secondary | ICD-10-CM | POA: Diagnosis not present

## 2020-06-21 DIAGNOSIS — R002 Palpitations: Secondary | ICD-10-CM | POA: Diagnosis not present

## 2020-06-21 DIAGNOSIS — M2550 Pain in unspecified joint: Secondary | ICD-10-CM | POA: Diagnosis not present

## 2020-06-21 DIAGNOSIS — M797 Fibromyalgia: Secondary | ICD-10-CM | POA: Diagnosis not present

## 2020-06-21 DIAGNOSIS — M6281 Muscle weakness (generalized): Secondary | ICD-10-CM | POA: Diagnosis not present

## 2020-06-21 DIAGNOSIS — M810 Age-related osteoporosis without current pathological fracture: Secondary | ICD-10-CM | POA: Diagnosis not present

## 2020-06-21 DIAGNOSIS — E785 Hyperlipidemia, unspecified: Secondary | ICD-10-CM | POA: Diagnosis not present

## 2020-06-21 DIAGNOSIS — K219 Gastro-esophageal reflux disease without esophagitis: Secondary | ICD-10-CM | POA: Diagnosis not present

## 2020-06-21 DIAGNOSIS — Z0100 Encounter for examination of eyes and vision without abnormal findings: Secondary | ICD-10-CM | POA: Diagnosis not present

## 2020-06-21 DIAGNOSIS — N8110 Cystocele, unspecified: Secondary | ICD-10-CM | POA: Diagnosis not present

## 2020-06-21 DIAGNOSIS — E559 Vitamin D deficiency, unspecified: Secondary | ICD-10-CM | POA: Diagnosis not present

## 2020-06-22 ENCOUNTER — Emergency Department (HOSPITAL_COMMUNITY)
Admission: EM | Admit: 2020-06-22 | Discharge: 2020-06-22 | Disposition: A | Discharge status: Home / Self Care | Payer: Medicare HMO | Attending: Emergency Medicine | Admitting: Emergency Medicine

## 2020-06-22 ENCOUNTER — Other Ambulatory Visit: Payer: Self-pay

## 2020-06-22 ENCOUNTER — Encounter (HOSPITAL_COMMUNITY): Payer: Self-pay | Admitting: Emergency Medicine

## 2020-06-22 ENCOUNTER — Emergency Department (HOSPITAL_COMMUNITY): Payer: Medicare HMO

## 2020-06-22 DIAGNOSIS — Z79899 Other long term (current) drug therapy: Secondary | ICD-10-CM | POA: Diagnosis not present

## 2020-06-22 DIAGNOSIS — M545 Low back pain, unspecified: Secondary | ICD-10-CM

## 2020-06-22 DIAGNOSIS — I10 Essential (primary) hypertension: Secondary | ICD-10-CM | POA: Insufficient documentation

## 2020-06-22 DIAGNOSIS — I2510 Atherosclerotic heart disease of native coronary artery without angina pectoris: Secondary | ICD-10-CM | POA: Diagnosis not present

## 2020-06-22 DIAGNOSIS — M79604 Pain in right leg: Secondary | ICD-10-CM | POA: Diagnosis not present

## 2020-06-22 DIAGNOSIS — M25551 Pain in right hip: Secondary | ICD-10-CM | POA: Diagnosis not present

## 2020-06-22 DIAGNOSIS — W19XXXA Unspecified fall, initial encounter: Secondary | ICD-10-CM | POA: Diagnosis not present

## 2020-06-22 DIAGNOSIS — R52 Pain, unspecified: Secondary | ICD-10-CM | POA: Diagnosis not present

## 2020-06-22 IMAGING — DX DG LUMBAR SPINE COMPLETE 4+V
5 series · 5 of 5 positions shown · non-contrast
Comparison: [DATE].

CLINICAL DATA: Lower back pain after fall today.

EXAM:
LUMBAR SPINE - COMPLETE 4+ VIEW

[t lumbar spine lat]
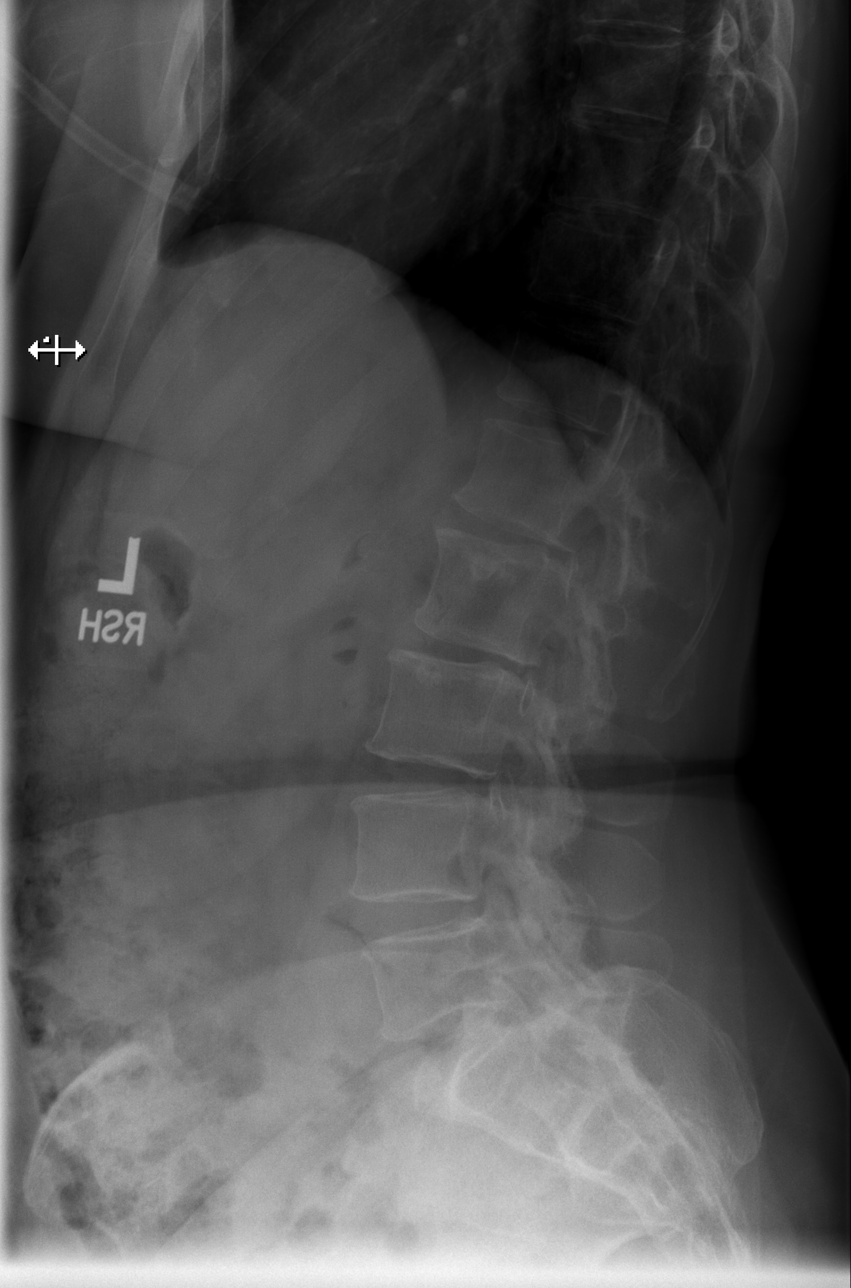

[t lumbar l-5 s-1 spot]
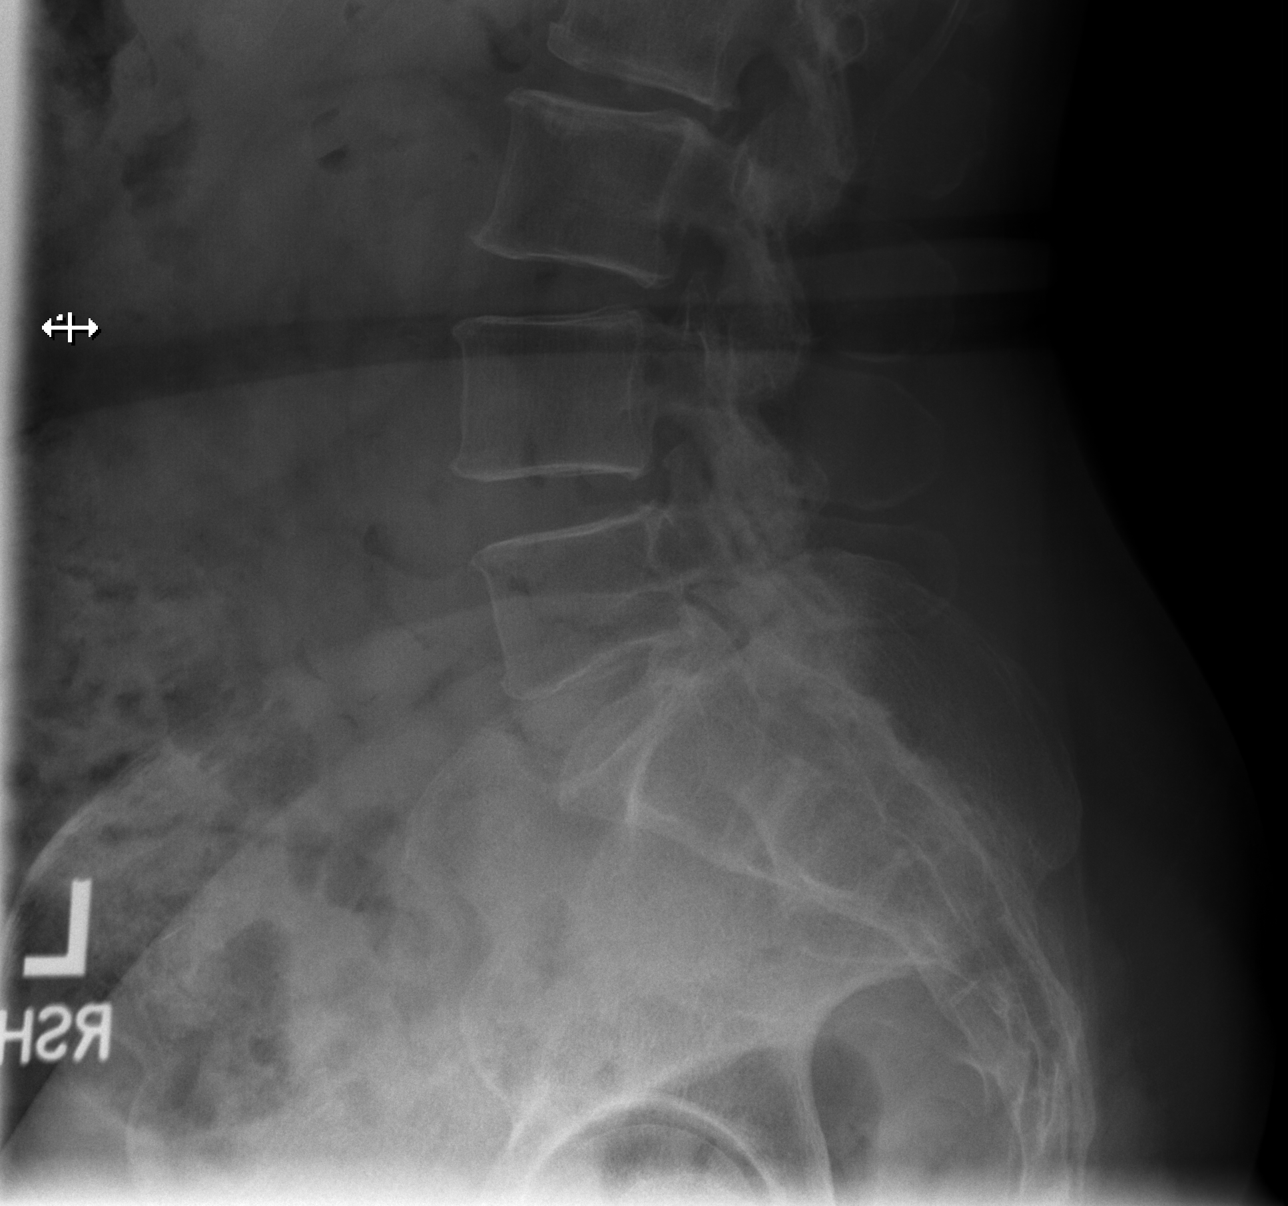

[t lumbar spine obl (1 of 2)]
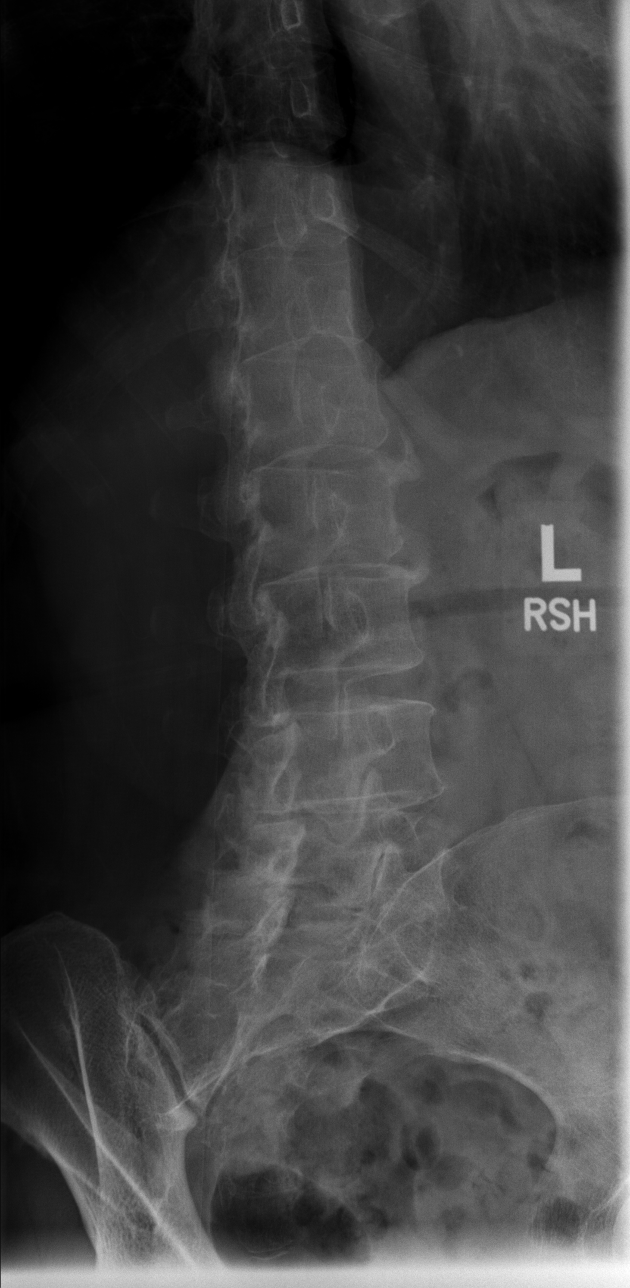

[t lumbar spine ap]
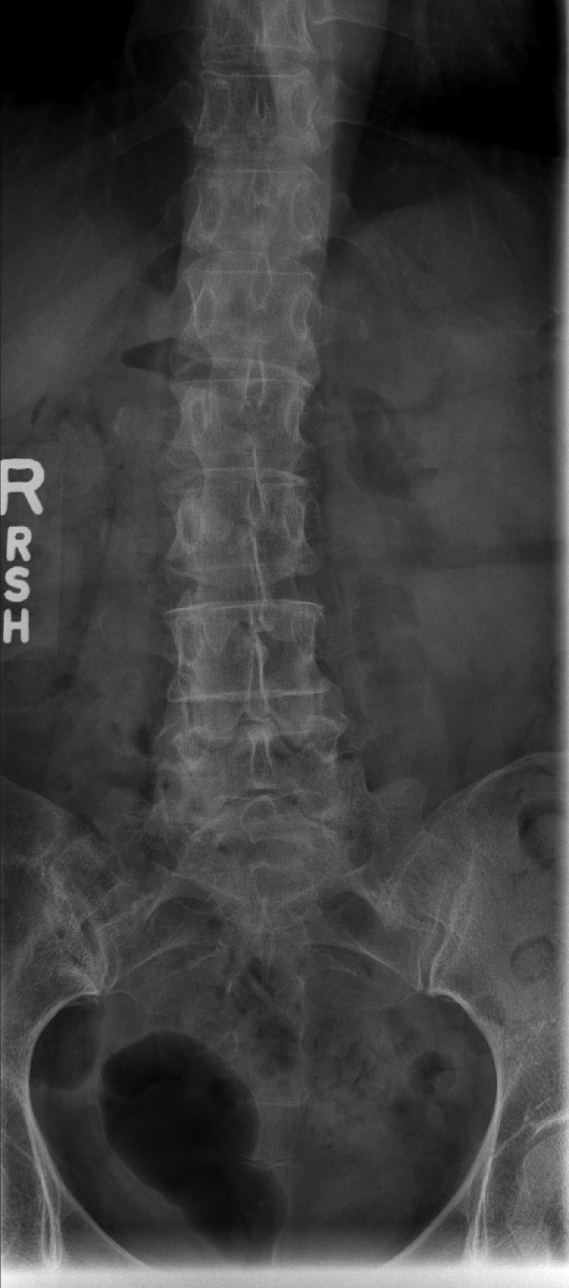

[t lumbar spine obl (2 of 2)]
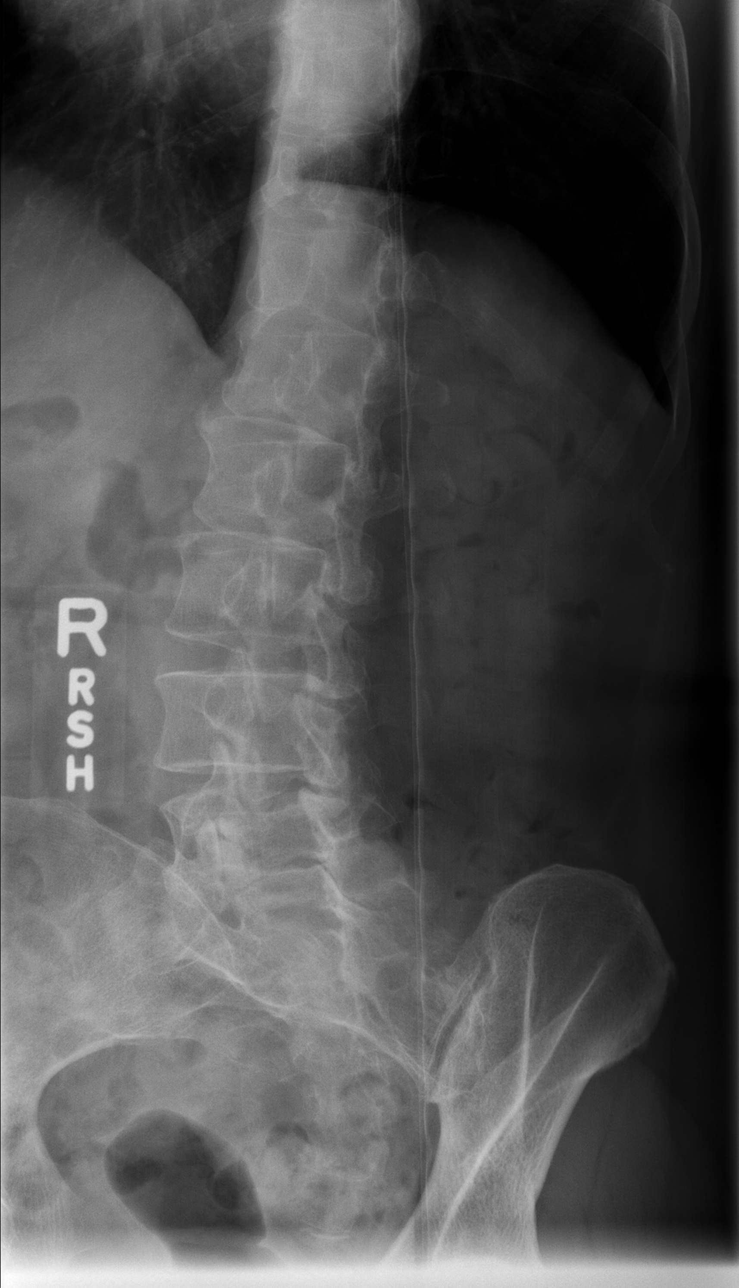

[5 of 5 positions shown; findings below may reference images not displayed]

FINDINGS: No fracture or spondylolisthesis is noted. Moderate degenerative
disc disease is noted at L1-2 and L2-3.
IMPRESSION: Moderate multilevel degenerative disc disease.

## 2020-06-22 IMAGING — DX DG HIP (WITH OR WITHOUT PELVIS) 2-3V*R*
3 series · 3 of 3 positions shown · non-contrast
Comparison: None.

CLINICAL DATA: Right hip pain after fall.

EXAM:
DG HIP (WITH OR WITHOUT PELVIS) 2-3V RIGHT

[t pelvis ap]
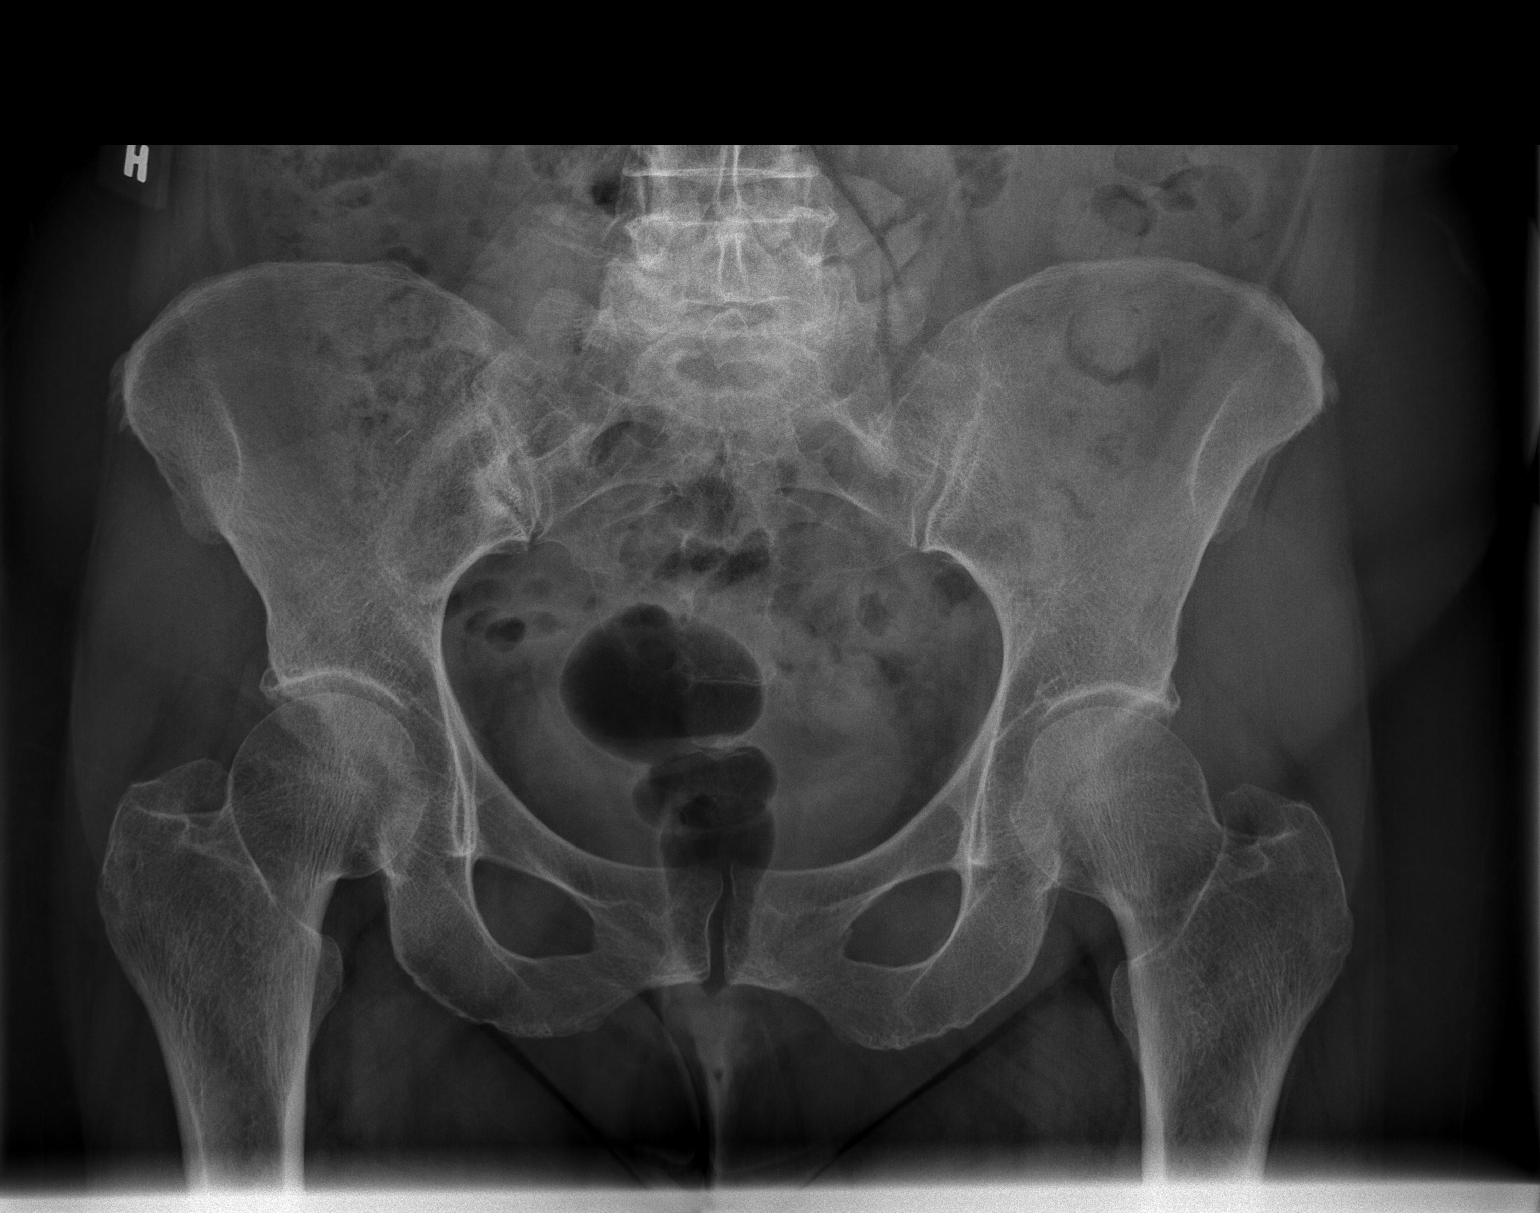

[t hip ap right]
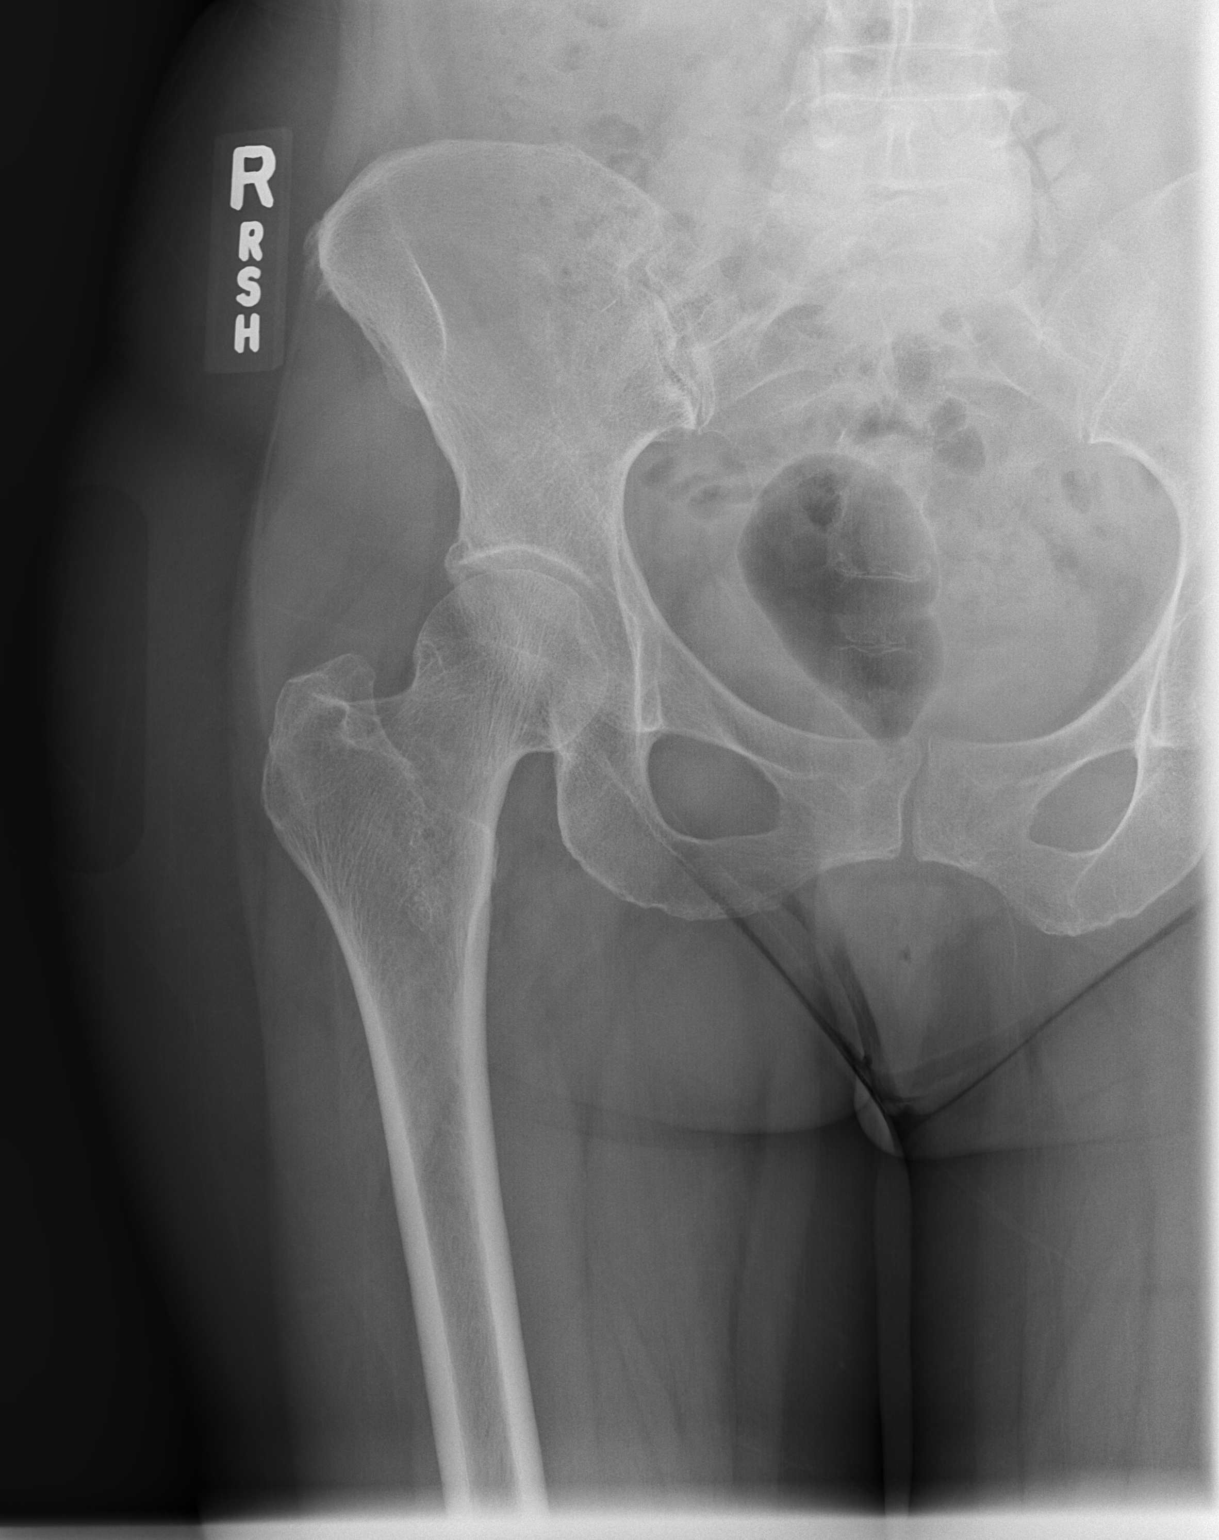

[t hip frog leg right]
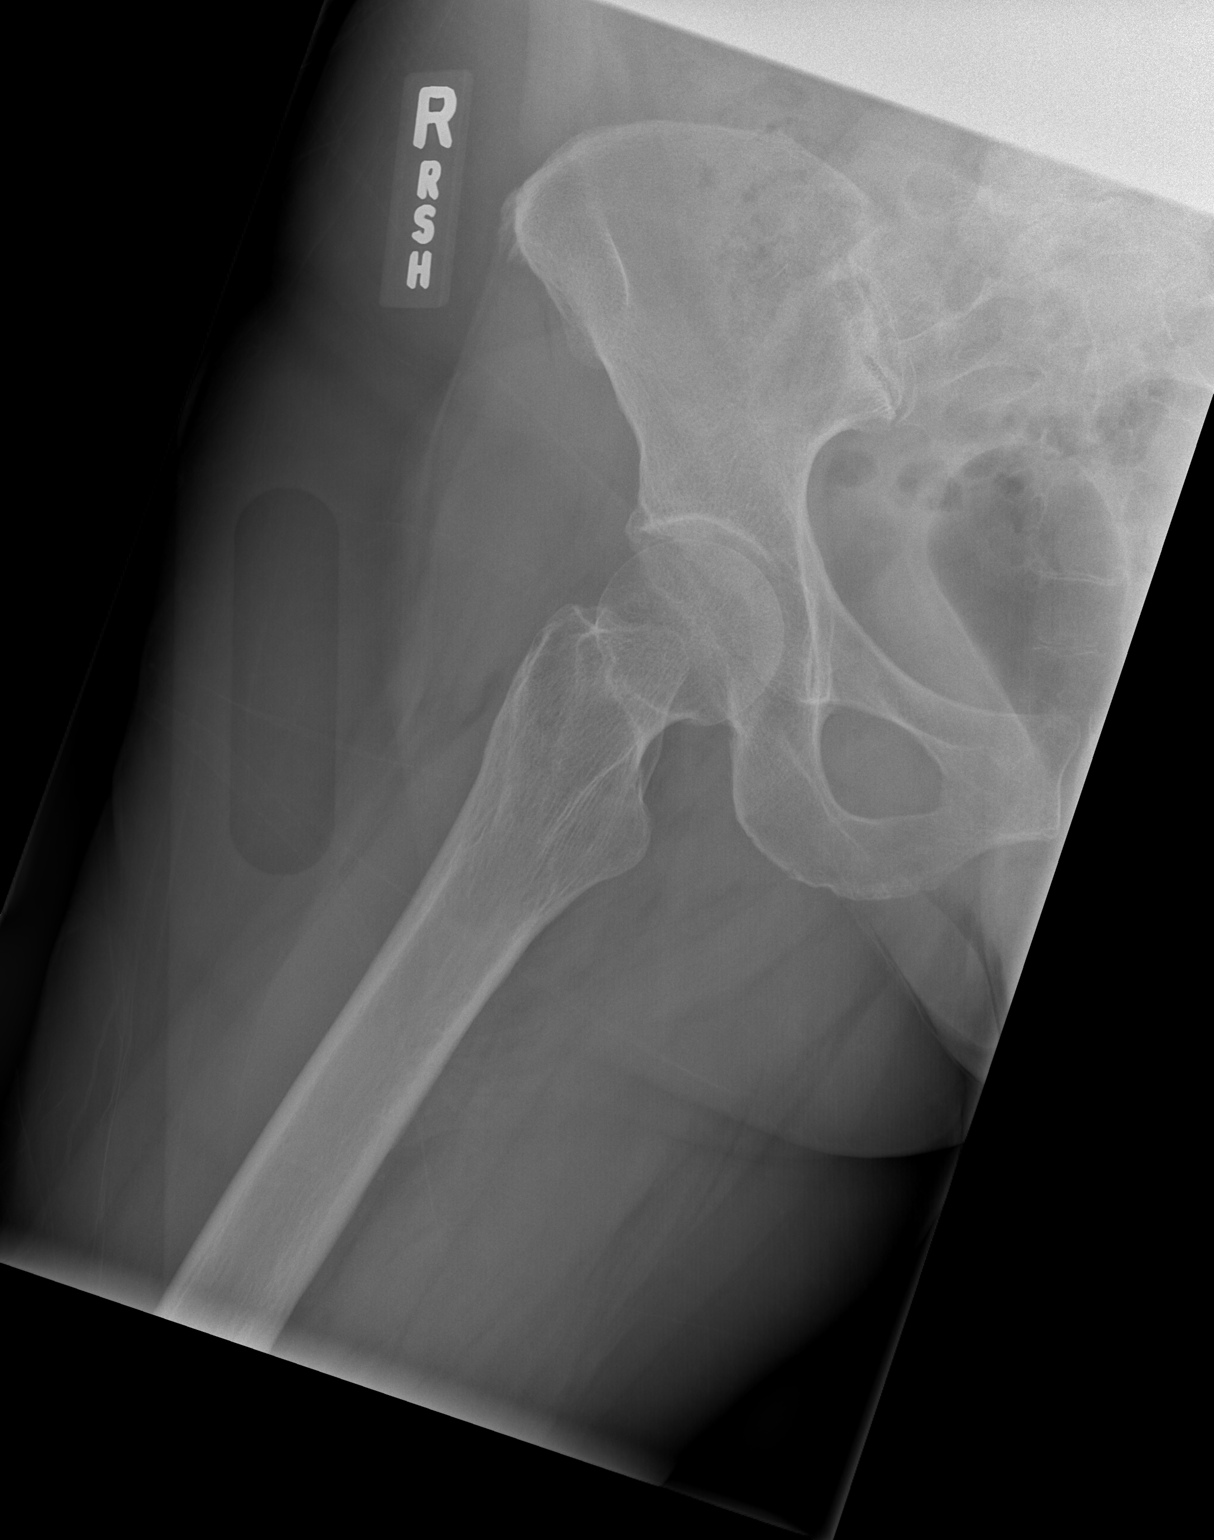

[3 of 3 positions shown; findings below may reference images not displayed]

FINDINGS: There is no evidence of hip fracture or dislocation. There is no
evidence of arthropathy or other focal bone abnormality.
IMPRESSION: Negative.

## 2020-06-22 MED ORDER — HYDROMORPHONE HCL 1 MG/ML IJ SOLN
0.5000 mg | Freq: Once | INTRAMUSCULAR | Status: AC
  Administered 2020-06-22: 0.5 mg via INTRAMUSCULAR
  Filled 2020-06-22: qty 1

## 2020-06-22 MED ORDER — DEXAMETHASONE 4 MG PO TABS
12.0000 mg | ORAL_TABLET | Freq: Once | ORAL | Status: AC
  Administered 2020-06-22: 12 mg via ORAL
  Filled 2020-06-22: qty 3

## 2020-06-22 MED ORDER — IBUPROFEN 400 MG PO TABS
600.0000 mg | ORAL_TABLET | Freq: Once | ORAL | Status: AC
  Administered 2020-06-22: 600 mg via ORAL
  Filled 2020-06-22: qty 1

## 2020-06-22 MED ORDER — MELOXICAM 15 MG PO TABS: 15.0000 mg | ORAL_TABLET | Freq: Every day | ORAL | 0 refills | Status: DC | PRN

## 2020-06-22 MED ORDER — DEXAMETHASONE 4 MG PO TABS: 4.0000 mg | ORAL_TABLET | Freq: Two times a day (BID) | ORAL | 0 refills | Status: DC

## 2020-06-22 NOTE — ED Notes (Signed)
AVS reviewed with pt who verbalized understanding. Pt departed ED via wheelchair

## 2020-06-22 NOTE — ED Triage Notes (Signed)
Patient had a work injury two weeks ago.  She fell this morning, continues with chronic right side pain.  Per Daughter, patient has been seen multiple times for same injury, needs to follow up with specialist and has been told to.  Patient's PCP will not give any more pain meds until she follows up with specialist.

## 2020-06-22 NOTE — ED Notes (Signed)
[phone], Daughter Pedro Earls, would like an update.

## 2020-06-22 NOTE — Discharge Instructions (Addendum)
Your x-rays show degenerative disease in your back (arthritis). The x-rays of your back and pelvis are otherwise fine. Take prescribed medication as indicated. Since you have been having pain for several weeks, at this point, the next step is to either follow-up with sports medicine or orthopedic surgery.

## 2020-06-22 NOTE — ED Provider Notes (Signed)
[facility] EMERGENCY DEPARTMENT Provider Note   CSN: [id number] Arrival date & time: 06/22/20  4315     History Chief Complaint  Patient presents with  . Fall    Amy Cline is a 64 y.o. female.  HPI   64 year old female with lower back and right lower extremity pain.  Ongoing issue for several weeks.  Evaluated as an outpatient for the same.  Denies any acute trauma or strain.  Sometimes gets numbness/tingling but no loss of strength.  No acute urinary complaints.  Denies any past back surgery.  She is not anticoagulated.  Past Medical History:  Diagnosis Date  . Arthritis    Bil hands  . Bladder prolapse, female, acquired   . Breast mass    left  . Chronic headaches   . Coronary artery disease   . Fibromyalgia   . GERD (gastroesophageal reflux disease)   . Glaucoma   . Hyperlipemia   . Hypertension   . Insomnia   . Prediabetes   . Vitamin D deficiency     Patient Active Problem List   Diagnosis Date Noted  . Rectocele 12/22/2019  . Well woman exam 04/01/2019  . Right arm pain 03/26/2019  . Chronic migraine without aura without status migrainosus, not intractable 11/18/2018  . Rectocele, female 08/11/2018  . Scalp tenderness 08/11/2018  . Atypical chest pain 07/17/2016  . Essential hypertension 03/29/2016  . Fibromyalgia 03/29/2016  . Gastroesophageal reflux disease without esophagitis 03/29/2016  . Chronic tension-type headache, not intractable 08/16/2015    Past Surgical History:  Procedure Laterality Date  . ABDOMINAL HYSTERECTOMY  2008  . APPENDECTOMY    . ARTERY BIOPSY Right 09/16/2018   Procedure: BIOPSY TEMPORAL ARTERY RIGHT;  Surgeon: Maeola Harman, MD;  Location: [facility] OR;  Service: Vascular;  Laterality: Right;  . BREAST BIOPSY     benign/left breast  . COLONOSCOPY  07/2018  . VAGINAL PROLAPSE REPAIR N/A 12/22/2019   Procedure: RECTOCELE REPAIR WITH GRAFT AND VAGINAL VAULT SUSPENSION/CYSTOSCOPY;  Surgeon: Alfredo Martinez, MD;  Location: [facility] ORS;  Service: Urology;  Laterality: N/A;     OB History    Gravida  3   Para  3   Term  3   Preterm  0   AB  0   Living  3     SAB  0   TAB  0   Ectopic  0   Multiple  0   Live Births  3           Family History  Problem Relation Age of Onset  . Hypertension Mother   . Hypertension Father   . Migraines Neg Hx     Social History   Tobacco Use  . Smoking status: Never Smoker  . Smokeless tobacco: Never Used  Vaping Use  . Vaping Use: Never used  Substance Use Topics  . Alcohol use: Not Currently    Comment: SOCIAL  . Drug use: No    Home Medications Prior to Admission medications   Medication Sig Start Date End Date Taking? Authorizing Provider  alendronate (FOSAMAX) 35 MG tablet Take 1 tablet (35 mg total) by mouth every 7 (seven) days. Take with a full glass of water on an empty stomach. 12/22/18   Grayce Sessions, NP  atorvastatin (LIPITOR) 20 MG tablet TAKE 1 TABLET BY MOUTH EVERY DAY 06/02/20   Grayce Sessions, NP  carboxymethylcellulose (REFRESH PLUS) 0.5 % SOLN 1 drop as needed.  [provider]  DULoxetine (CYMBALTA) 60 MG capsule TAKE 1 CAPSULE BY MOUTH EVERY DAY Patient taking differently: Take 60 mg by mouth daily.  07/02/19   Grayce Sessions, NP  gabapentin (NEURONTIN) 300 MG capsule Take 1 capsule (300 mg total) by mouth 3 (three) times daily. 07/02/19   Grayce Sessions, NP  HYDROcodone-acetaminophen (NORCO) 5-325 MG tablet Take 1-2 tablets by mouth every 6 (six) hours as needed for moderate pain. 12/22/19   Harrie Foreman, PA-C  losartan-hydrochlorothiazide (HYZAAR) 50-12.5 MG tablet Take 1 tablet by mouth daily. 12/10/19   [provider]  metoprolol succinate (TOPROL-XL) 25 MG 24 hr tablet Take 25 mg by mouth daily. 12/04/19   [provider]  omeprazole (PRILOSEC) 20 MG capsule TAKE 1 CAPSULE BY MOUTH EVERY DAY. Please make PCP appointment. Patient taking differently: Take 20  mg by mouth daily.  11/03/19   Hoy Register, MD  ondansetron (ZOFRAN-ODT) 4 MG disintegrating tablet Take 1 tablet (4 mg total) by mouth every 8 (eight) hours as needed for nausea. 11/18/18   Anson Fret, MD  sulfamethoxazole-trimethoprim (BACTRIM DS) 800-160 MG tablet Take 1 tablet by mouth 2 (two) times daily. 07/02/19   Grayce Sessions, NP  SUMAtriptan (IMITREX) 100 MG tablet Take 1 tablet (100 mg total) by mouth once as needed for up to 1 dose. May repeat in 2 hours if headache persists or recurs. 11/18/18   Anson Fret, MD  triamcinolone cream (KENALOG) 0.1 % Apply 1 application topically 2 (two) times daily. Patient taking differently: Apply 1 application topically as needed.  01/09/18   Loletta Specter, PA-C    Allergies    Patient has no known allergies.  Review of Systems   Review of Systems All systems reviewed and negative, other than as noted in HPI.  Physical Exam Updated Vital Signs BP (!) 149/92 (BP Location: Left Arm)   Pulse 66   Temp 98.1 F (36.7 C) (Oral)   Resp 17   SpO2 97%   Physical Exam Vitals and nursing note reviewed.  Constitutional:      General: She is not in acute distress.    Appearance: She is well-developed.  HENT:     Head: Normocephalic and atraumatic.  Eyes:     General:        Right eye: No discharge.        Left eye: No discharge.     Conjunctiva/sclera: Conjunctivae normal.  Cardiovascular:     Rate and Rhythm: Normal rate and regular rhythm.     Heart sounds: Normal heart sounds. No murmur heard.  No friction rub. No gallop.   Pulmonary:     Effort: Pulmonary effort is normal. No respiratory distress.     Breath sounds: Normal breath sounds.  Abdominal:     General: There is no distension.     Palpations: Abdomen is soft.     Tenderness: There is no abdominal tenderness.  Musculoskeletal:     Cervical back: Neck supple.     Comments: Tenderness to palpation along the right SI area into the right buttock and  posterior proximal thigh.  Strength is 5 out of 5 bilateral lower extremities.  Sensation intact to light touch.  No midline spinal tenderness.  Skin:    General: Skin is warm and dry.  Neurological:     Mental Status: She is alert.  Psychiatric:        Behavior: Behavior normal.  Thought Content: Thought content normal.     ED Results / Procedures / Treatments   Labs (all labs ordered are listed, but only abnormal results are displayed) Labs Reviewed - No data to display  EKG None  Radiology No results found.   DG Lumbar Spine Complete  Result Date: 06/22/2020 CLINICAL DATA:  Lower back pain after fall today. EXAM: LUMBAR SPINE - COMPLETE 4+ VIEW COMPARISON:  July 26, 2012. FINDINGS: No fracture or spondylolisthesis is noted. Moderate degenerative disc disease is noted at L1-2 and L2-3. IMPRESSION: Moderate multilevel degenerative disc disease. Electronically Signed   By: Lupita Raider M.D.   On: 06/22/2020 09:57   DG Hip Unilat W or Wo Pelvis 2-3 Views Right  Result Date: 06/22/2020 CLINICAL DATA:  Right hip pain after fall. EXAM: DG HIP (WITH OR WITHOUT PELVIS) 2-3V RIGHT COMPARISON:  None. FINDINGS: There is no evidence of hip fracture or dislocation. There is no evidence of arthropathy or other focal bone abnormality. IMPRESSION: Negative. Electronically Signed   By: Lupita Raider M.D.   On: 06/22/2020 09:56    Procedures Procedures (including critical care time)  Medications Ordered in ED Medications - No data to display  ED Course  I have reviewed the triage vital signs and the nursing notes.  Pertinent labs & imaging results that were available during my care of the patient were reviewed by me and considered in my medical decision making (see chart for details).    MDM Rules/Calculators/A&P                          64 year old female with lower back pain.  Radicular component.  No overt red flags.  Symptomatic treatment.  Outpatient follow-up.  Plain  films negative. Final Clinical Impression(s) / ED Diagnoses Final diagnoses:  Right leg pain  Right low back pain, unspecified chronicity, unspecified whether sciatica present    Rx / DC Orders ED Discharge Orders    None       Raeford Razor, MD 06/25/20 1609

## 2020-06-29 ENCOUNTER — Other Ambulatory Visit: Payer: Self-pay

## 2020-06-29 ENCOUNTER — Ambulatory Visit (INDEPENDENT_AMBULATORY_CARE_PROVIDER_SITE_OTHER): Payer: Medicare HMO | Admitting: Family Medicine

## 2020-06-29 VITALS — BP 108/82 | Ht 63.0 in | Wt 164.0 lb

## 2020-06-29 DIAGNOSIS — M545 Low back pain, unspecified: Secondary | ICD-10-CM

## 2020-06-29 DIAGNOSIS — R55 Syncope and collapse: Secondary | ICD-10-CM

## 2020-06-29 DIAGNOSIS — G8929 Other chronic pain: Secondary | ICD-10-CM | POA: Diagnosis not present

## 2020-06-29 DIAGNOSIS — S46819A Strain of other muscles, fascia and tendons at shoulder and upper arm level, unspecified arm, initial encounter: Secondary | ICD-10-CM | POA: Diagnosis not present

## 2020-06-29 MED ORDER — DICLOFENAC SODIUM 75 MG PO TBEC: 75.0000 mg | DELAYED_RELEASE_TABLET | Freq: Two times a day (BID) | ORAL | 1 refills | Status: DC

## 2020-06-29 MED ORDER — BACLOFEN 10 MG PO TABS: 10.0000 mg | ORAL_TABLET | Freq: Three times a day (TID) | ORAL | 1 refills | Status: DC | PRN

## 2020-06-29 NOTE — Patient Instructions (Addendum)
We will go ahead with an MRI of your lumbar spine to assess for a disc herniation. Stop the meloxicam and the tizanidine. Take diclofenac 75mg  twice a day with food for 7-10 days then as needed. Baclofen as needed for muscle spasms. Start physical therapy for your trapezius strain/spasms and back pain. We will refer you to cardiology for your passing out episodes and history of heart disease. Consider lyrica instead of gabapentin but we will send this note to your primary care provider to discuss with you further. Follow up with me in 1 month (or after MRI to go over those results).

## 2020-06-30 ENCOUNTER — Encounter: Payer: Self-pay | Admitting: Family Medicine

## 2020-06-30 NOTE — Progress Notes (Signed)
PCP: Pcp, No  Subjective:   HPI: Patient is a 64 y.o. female here for low back pain.  Interpreter was present for today's visit. Patient reports she has had fairly severe low back pain with radiation into the right lower extremity for several weeks. She has known history of fibromyalgia but feels like this pain is different. Reports numbness and tingling down the right leg into the foot and the digits. No bowel or bladder dysfunction. No weakness. She is currently taking gabapentin. She had previously been on Cymbalta but states that she has stopped this.  Notes also indicate that at some point she was taking nortriptyline.  She is not certain if she is tried Lyrica. She states that this low back pain started after she suffered a fall in the bathtub. She is uncertain how she fell but states she just passed out and lost consciousness for a few minutes. Of concern she states that she had 3 episodes of sudden syncope last year and she does have known history of coronary artery disease but does not have a cardiologist currently. For the current pain she went to an outside facility on August 24 and was given prednisone 20 mg for a week and tizanidine which did not provide much relief. Then on September 8 she went to emergency department and was prescribed meloxicam and dexamethasone.  Is also seem to have not helped her very much.  Past Medical History:  Diagnosis Date  . Arthritis    Bil hands  . Bladder prolapse, female, acquired   . Breast mass    left  . Chronic headaches   . Coronary artery disease   . Fibromyalgia   . GERD (gastroesophageal reflux disease)   . Glaucoma   . Hyperlipemia   . Hypertension   . Insomnia   . Prediabetes   . Vitamin D deficiency     Current Outpatient Medications on File Prior to Visit  Medication Sig Dispense Refill  . alendronate (FOSAMAX) 35 MG tablet Take 1 tablet (35 mg total) by mouth every 7 (seven) days. Take with a full glass of water on  an empty stomach. 4 tablet 11  . atorvastatin (LIPITOR) 20 MG tablet TAKE 1 TABLET BY MOUTH EVERY DAY 90 tablet 0  . carboxymethylcellulose (REFRESH PLUS) 0.5 % SOLN 1 drop as needed.    Marland Kitchen dexamethasone (DECADRON) 4 MG tablet Take 1 tablet (4 mg total) by mouth 2 (two) times daily. 6 tablet 0  . DULoxetine (CYMBALTA) 60 MG capsule TAKE 1 CAPSULE BY MOUTH EVERY DAY (Patient taking differently: Take 60 mg by mouth daily. ) 30 capsule 3  . gabapentin (NEURONTIN) 300 MG capsule Take 1 capsule (300 mg total) by mouth 3 (three) times daily. 270 capsule 3  . losartan-hydrochlorothiazide (HYZAAR) 50-12.5 MG tablet Take 1 tablet by mouth daily.    . metoprolol succinate (TOPROL-XL) 25 MG 24 hr tablet Take 25 mg by mouth daily.    Marland Kitchen omeprazole (PRILOSEC) 20 MG capsule TAKE 1 CAPSULE BY MOUTH EVERY DAY. Please make PCP appointment. (Patient taking differently: Take 20 mg by mouth daily. ) 30 capsule 0  . ondansetron (ZOFRAN-ODT) 4 MG disintegrating tablet Take 1 tablet (4 mg total) by mouth every 8 (eight) hours as needed for nausea. 30 tablet 3  . sulfamethoxazole-trimethoprim (BACTRIM DS) 800-160 MG tablet Take 1 tablet by mouth 2 (two) times daily. 14 tablet 0  . SUMAtriptan (IMITREX) 100 MG tablet Take 1 tablet (100 mg total) by mouth once as  needed for up to 1 dose. May repeat in 2 hours if headache persists or recurs. 10 tablet 12  . triamcinolone cream (KENALOG) 0.1 % Apply 1 application topically 2 (two) times daily. (Patient taking differently: Apply 1 application topically as needed. ) 30 g 0   Current Facility-Administered Medications on File Prior to Visit  Medication Dose Route Frequency Provider Last Rate Last Admin  . 0.9 %  sodium chloride infusion  500 mL Intravenous Once Meryl Dare, MD        Past Surgical History:  Procedure Laterality Date  . ABDOMINAL HYSTERECTOMY  2008  . APPENDECTOMY    . ARTERY BIOPSY Right 09/16/2018   Procedure: BIOPSY TEMPORAL ARTERY RIGHT;  Surgeon:  Maeola Harman, MD;  Location: [facility] OR;  Service: Vascular;  Laterality: Right;  . BREAST BIOPSY     benign/left breast  . COLONOSCOPY  07/2018  . VAGINAL PROLAPSE REPAIR N/A 12/22/2019   Procedure: RECTOCELE REPAIR WITH GRAFT AND VAGINAL VAULT SUSPENSION/CYSTOSCOPY;  Surgeon: Alfredo Martinez, MD;  Location: [facility] ORS;  Service: Urology;  Laterality: N/A;    No Known Allergies  Social History   Socioeconomic History  . Marital status: Legally Separated    Spouse name: Maisie Fus  . Number of children: 2  . Years of education: 23  . Highest education level: Not on file  Occupational History  . Occupation: Retired  Tobacco Use  . Smoking status: Never Smoker  . Smokeless tobacco: Never Used  Vaping Use  . Vaping Use: Never used  Substance and Sexual Activity  . Alcohol use: Not Currently    Comment: SOCIAL  . Drug use: No  . Sexual activity: Yes    Birth control/protection: Surgical  Other Topics Concern  . Not on file  Social History Narrative   Lives at home alone   Caffeine use: 1-2 cups per day   Social Determinants of Health   Financial Resource Strain:   . Difficulty of Paying Living Expenses: Not on file  Food Insecurity:   . Worried About Programme researcher, broadcasting/film/video in the Last Year: Not on file  . Ran Out of Food in the Last Year: Not on file  Transportation Needs:   . Lack of Transportation (Medical): Not on file  . Lack of Transportation (Non-Medical): Not on file  Physical Activity:   . Days of Exercise per Week: Not on file  . Minutes of Exercise per Session: Not on file  Stress:   . Feeling of Stress : Not on file  Social Connections:   . Frequency of Communication with Friends and Family: Not on file  . Frequency of Social Gatherings with Friends and Family: Not on file  . Attends Religious Services: Not on file  . Active Member of Clubs or Organizations: Not on file  . Attends Banker Meetings: Not on file  . Marital Status: Not on file   Intimate Partner Violence:   . Fear of Current or Ex-Partner: Not on file  . Emotionally Abused: Not on file  . Physically Abused: Not on file  . Sexually Abused: Not on file    Family History  Problem Relation Age of Onset  . Hypertension Mother   . Hypertension Father   . Migraines Neg Hx     BP 108/82   Ht 5\' 3"  (1.6 m)   Wt 164 lb (74.4 kg)   BMI 29.05 kg/m   No flowsheet data found.  No flowsheet data found.  Review  of Systems: See HPI above.     Objective:  Physical Exam:  Gen: NAD, comfortable in exam room  Back: No gross deformity, scoliosis. TTP right > left paraspinal lumbar region though tenderness throughout back including thoracic region and trapezius muscles even with light palpation.  No midline or bony TTP. FROM. Strength LEs 5/5 all muscle groups except 4/5 with knee flexion and extension ? Due to effort.   2+ MSRs in patellar and achilles tendons, equal bilaterally. Negative SLRs. Sensation intact to light touch bilaterally. Negative logroll bilateral hips   Assessment & Plan:  1. Low back pain with radiation into right lower extremity -patient with underlying fibromyalgia but reports this feels differently.  Consistent with lumbar radiculopathy not responsive to steroids, NSAIDs, muscle relaxant.  We will go ahead with MRI of lumbar spine to further assess.  She was advised to stop meloxicam and tizanidine.  Try diclofenac with baclofen as needed.  Start physical therapy as well for this and for trapezius spasms.  2.  Fibromyalgia - discussed can consider switch from nortriptyline from lyrica but would defer to PCP.  3. Syncopal episodes - in setting of known CAD.  Will refer to cardiology for evaluation.

## 2020-07-08 NOTE — Progress Notes (Signed)
Cardiology Office Note:    Date:  07/11/2020   ID:  Amy Cline, DOB [birth year], MRN [id number]  PCP:  Norm Salt, PA  [facility] Cardiologist:  No primary care provider on file.  [facility] Electrophysiologist:  None   Referring MD: Lenda Kelp, MD    History of Present Illness:    Patient seen and examined with Spanish Interpreter.  Amy Cline is a 64 y.o. female with a hx of HTN, HLD and fibromyalgia who was referred by Dr. Jamey Ripa for concern for syncope.   Note from Dr. Jamey Ripa dated 06/29/20 reviewed. Patient reportedly had 3 episodes of fainting over the past year. States that on 06/23/2020, she was walking to the bathroom and just remembers waking up on the floor. No preceding chest pain, palpitations, SOB, nausea or vomiting. Had 3 similar episodes over the past year. States that one fainting episode, she woke up on the floor and was vomiting trace amounts of blood and blood in the stool. Went to ER and stayed in hospital for 2 days. Thought she "had an infection in the gut" and symptoms resolved. Colonoscopy 2019 normal. Patient is unclear what triggers the fainting spells. Does not know how long she is down. Most episodes occurred when going to the bathroom or after the shower. No seizures. Returns back to normal mental status afterwards. She states she has no warning signs and does not know when she is going to faint. No diabetes, no hypotension, no electrolyte abnormalities. No known CHF. Has ? Diagnosis of CAD however, no functional testing or caths in our system and possible that she had cath for ablation in 2006 as patient states that she had the procedure for palpitations (no stents placed).  Has a lot of other symptoms including bad HA, pain in her breast, pleuritic chest pain, low back pain, left neck pain, and occasional SOB. No orthopnea, PND, LE edema, seizures. While she has a lot of symptoms, none appear to be exertional or worse with exertion.  States she has a lot of pain with her fibromyalgia.  No known family history of arrhythmias, SCD, CAD, or heart failure.  Last saw Dr. Allyson Sabal in 07/2016 when she presented with atypical, pleuritic chest pain that did not appear to be cardiac in nature. Has not followed up since.   Carotid ultrasound 2019: Left carotid with 40-59% stenosis.  Labs 0310/21: Cr 0.67, K 4, INR 1.0, HgB 11.3, A1C 6.1  Past Medical History:  Diagnosis Date  . Arthritis    Bil hands  . Bladder prolapse, female, acquired   . Breast mass    left  . Chronic headaches   . Coronary artery disease   . Fibromyalgia   . GERD (gastroesophageal reflux disease)   . Glaucoma   . Hyperlipemia   . Hypertension   . Insomnia   . Prediabetes   . Vitamin D deficiency     Past Surgical History:  Procedure Laterality Date  . ABDOMINAL HYSTERECTOMY  2008  . APPENDECTOMY    . ARTERY BIOPSY Right 09/16/2018   Procedure: BIOPSY TEMPORAL ARTERY RIGHT;  Surgeon: Maeola Harman, MD;  Location: [facility] OR;  Service: Vascular;  Laterality: Right;  . BREAST BIOPSY     benign/left breast  . COLONOSCOPY  07/2018  . VAGINAL PROLAPSE REPAIR N/A 12/22/2019   Procedure: RECTOCELE REPAIR WITH GRAFT AND VAGINAL VAULT SUSPENSION/CYSTOSCOPY;  Surgeon: Alfredo Martinez, MD;  Location: [facility] ORS;  Service: Urology;  Laterality: N/A;    Current  Medications: Current Meds  Medication Sig  . alendronate (FOSAMAX) 35 MG tablet Take 1 tablet (35 mg total) by mouth every 7 (seven) days. Take with a full glass of water on an empty stomach.  Marland Kitchen atorvastatin (LIPITOR) 20 MG tablet TAKE 1 TABLET BY MOUTH EVERY DAY  . baclofen (LIORESAL) 10 MG tablet Take 1 tablet (10 mg total) by mouth 3 (three) times daily as needed for muscle spasms.  . carboxymethylcellulose (REFRESH PLUS) 0.5 % SOLN 1 drop as needed.  . diclofenac (VOLTAREN) 75 MG EC tablet Take 1 tablet (75 mg total) by mouth 2 (two) times daily.  . DULoxetine (CYMBALTA) 60 MG capsule TAKE  1 CAPSULE BY MOUTH EVERY DAY  . gabapentin (NEURONTIN) 300 MG capsule Take 1 capsule (300 mg total) by mouth 3 (three) times daily.  Marland Kitchen losartan-hydrochlorothiazide (HYZAAR) 50-12.5 MG tablet Take 1 tablet by mouth daily.  . metoprolol succinate (TOPROL-XL) 25 MG 24 hr tablet Take 25 mg by mouth daily.  . nortriptyline (PAMELOR) 25 MG capsule Take 25 mg by mouth at bedtime.  Marland Kitchen omeprazole (PRILOSEC) 20 MG capsule TAKE 1 CAPSULE BY MOUTH EVERY DAY. Please make PCP appointment.  . ondansetron (ZOFRAN-ODT) 4 MG disintegrating tablet Take 1 tablet (4 mg total) by mouth every 8 (eight) hours as needed for nausea.  . SUMAtriptan (IMITREX) 100 MG tablet Take 1 tablet (100 mg total) by mouth once as needed for up to 1 dose. May repeat in 2 hours if headache persists or recurs.   Current Facility-Administered Medications for the 07/11/20 encounter (Office Visit) with Meriam Sprague, MD  Medication  . 0.9 %  sodium chloride infusion     Allergies:   Patient has no known allergies.   Social History   Socioeconomic History  . Marital status: Legally Separated    Spouse name: Maisie Fus  . Number of children: 2  . Years of education: 74  . Highest education level: Not on file  Occupational History  . Occupation: Retired  Tobacco Use  . Smoking status: Never Smoker  . Smokeless tobacco: Never Used  Vaping Use  . Vaping Use: Never used  Substance and Sexual Activity  . Alcohol use: Not Currently    Comment: SOCIAL  . Drug use: No  . Sexual activity: Yes    Birth control/protection: Surgical  Other Topics Concern  . Not on file  Social History Narrative   Lives at home alone   Caffeine use: 1-2 cups per day   Social Determinants of Health   Financial Resource Strain:   . Difficulty of Paying Living Expenses: Not on file  Food Insecurity:   . Worried About Programme researcher, broadcasting/film/video in the Last Year: Not on file  . Ran Out of Food in the Last Year: Not on file  Transportation Needs:   .  Lack of Transportation (Medical): Not on file  . Lack of Transportation (Non-Medical): Not on file  Physical Activity:   . Days of Exercise per Week: Not on file  . Minutes of Exercise per Session: Not on file  Stress:   . Feeling of Stress : Not on file  Social Connections:   . Frequency of Communication with Friends and Family: Not on file  . Frequency of Social Gatherings with Friends and Family: Not on file  . Attends Religious Services: Not on file  . Active Member of Clubs or Organizations: Not on file  . Attends Banker Meetings: Not on file  . Marital  Status: Not on file     Family History: The patient's family history includes Hypertension in her father and mother. There is no history of Migraines.  ROS:   Please see the history of present illness.     All other systems reviewed and are negative.  EKGs/Labs/Other Studies Reviewed:    The following studies were reviewed today:  ECG 12/2019 NSR with low voltage in precordial leads   09/15/2018 carotid ultrasound: Summary:  Right Carotid: There is no evidence of stenosis in the right ICA.  Left Carotid: Velocities in the left ICA are consistent with a 40-59%  stenosis. Vertebrals: Bilateral vertebral arteries demonstrate antegrade flow.  Subclavians: Normal flow hemodynamics were seen in bilateral subclavian        arteries.   EKG:  EKG is  ordered today.  The ekg ordered today demonstrates sinus tachycardia with HR 101. No block. Normal Qtc.  Recent Labs: 12/14/2019: Platelets 242 12/23/2019: BUN 13; Creatinine, Ser 0.67; Hemoglobin 11.3; Potassium 4.0; Sodium 136  Recent Lipid Panel    Component Value Date/Time   CHOL 148 07/02/2019 1153   TRIG 201 (H) 07/02/2019 1153   HDL 52 07/02/2019 1153   CHOLHDL 2.8 07/02/2019 1153   LDLCALC 63 07/02/2019 1153    Physical Exam:    VS:  Pulse (!) 101   Ht 5\' 3"  (1.6 m)   Wt 162 lb 6.4 oz (73.7 kg)   SpO2 97%   BMI 28.77 kg/m     Wt Readings  from Last 3 Encounters:  07/11/20 162 lb 6.4 oz (73.7 kg)  06/29/20 164 lb (74.4 kg)  12/22/19 160 lb 15 oz (73 kg)     GEN:  Well nourished, well developed in no acute distress HEENT: Normal NECK: No JVD; No carotid bruits LYMPHATICS: No lymphadenopathy CARDIAC: RRR, no murmurs, rubs, gallops RESPIRATORY:  Clear to auscultation without rales, wheezing or rhonchi  ABDOMEN: Soft, non-tender, non-distended MUSCULOSKELETAL:  No edema; No deformity  SKIN: Warm and dry NEUROLOGIC:  Alert and oriented x 3 PSYCHIATRIC:  Normal affect   ASSESSMENT:    1. Syncope and collapse   2. Chest pain, unspecified type   3. Palpitations   4. Essential hypertension    PLAN:    In order of problems listed above:  #Syncope: Unclear etiology, however, reported sudden LOC is concerning for cardiac etiology. States she cannot predict when she is going to pass out and symptoms do not appear to be positional or related to exertion. Has ? Diagnosis of CAD, however no functional studies or caths in our system. ECG with sinus tach without ischemia or evidence of infarction. No block. Normal QTC.  Orthostatics negative today.  -Nuclear stress -Obtain TTE to assess LV function and valves -Event monitor due to occasional palpitations  #Atypical Chest Pain: Left breast stabbing pain worse with inspiration. Occurs throughout the day. Not triggered with exertion. Often improves with walking or slow breathing. -Syncope work-up as above -Current chest pain does not sound cardiac in nature but obtaining functional testing in the setting of syncope  #Left Internal Carotid Artery Stenosis: 2019 carotid ultrasound with 40-59% stenosis. No history of TIA/CVA. -Start ASA 81mg  daily -Continue atorvastatin 20mg  daily. LDL well-controlled at 63.  #HTN: Managed by PCP. -Losartan 50mg -HCTZ 12.5mg  -Metop 25mg  daily  #HLD: Last LDL 63, HDL 52 in 06/2019. Managed by PCP. -Continue atorvastatin 20mg   daily  Medication Adjustments/Labs and Tests Ordered: Current medicines are reviewed at length with the patient today.  Concerns regarding  medicines are outlined above.  Orders Placed This Encounter  Procedures  . MYOCARDIAL PERFUSION IMAGING  . LONG TERM MONITOR (3-14 DAYS)  . EKG 12-Lead  . ECHOCARDIOGRAM COMPLETE   Meds ordered this encounter  Medications  . aspirin EC 81 MG tablet    Sig: Take 1 tablet (81 mg total) by mouth daily. Swallow whole.    Dispense:  90 tablet    Refill:  3    Patient Instructions  Medication Instructions:  Your physician has recommended you make the following change in your medication:  1.  START Aspirin 81 mg taking 1 daily   *If you need a refill on your cardiac medications before your next appointment, please call your pharmacy*   Lab Work: None ordered  If you have labs (blood work) drawn today and your tests are completely normal, you will receive your results only by: Marland Kitchen MyChart Message (if you have MyChart) OR . A paper copy in the mail If you have any lab test that is abnormal or we need to change your treatment, we will call you to review the results.   Testing/Procedures: Your physician has requested that you have an echocardiogram. Echocardiography is a painless test that uses sound waves to create images of your heart. It provides your doctor with information about the size and shape of your heart and how well your heart's chambers and valves are working. This procedure takes approximately one hour. There are no restrictions for this procedure.    Your physician has requested that you have a lexiscan myoview. For further information please visit [email or web address] Please follow instruction sheet, BELOW:     You are scheduled for a Myocardial Perfusion Imaging Study.    Please arrive 15 minutes prior to your appointment time for registration and insurance purposes.  The test will take approximately 3 to 4 hours to complete;  you may bring reading material.  If someone comes with you to your appointment, they will need to remain in the main lobby due to limited space in the testing area. **If you are pregnant or breastfeeding, please notify the nuclear lab prior to your appointment**  How to prepare for your Myocardial Perfusion Test: . Do not eat or drink 3 hours prior to your test, except you may have water. . Do not consume products containing caffeine (regular or decaffeinated) 12 hours prior to your test. (ex: coffee, chocolate, sodas, tea). Do bring a list of your current medications with you.  If not listed below, you may take your medications as normal. . No tome Metoprololo  durante las 24 horas antes de la prueba. Traiga el medicamento a su cita, ya que es posible que tenga que tomarlo una vez que termine la prueba.  Hold the Losartan-HCTZ on the morning of your test  . Do wear comfortable clothes (no dresses or overalls) and walking shoes, tennis shoes preferred (No heels or open toe shoes are allowed). . Do NOT wear cologne, perfume, aftershave, or lotions (deodorant is allowed). . If these instructions are not followed, your test will have to be rescheduled.     ZIO XT- Long Term Monitor Instructions   Your physician has requested you wear your ZIO patch monito 14 days.   This is a single patch monitor.  [employer] supplies one patch monitor per enrollment.  Additional stickers are not available.   Please do not apply patch if you will be having a Nuclear Stress Test, Echocardiogram, Cardiac CT, MRI, or Chest Xray during  the time frame you would be wearing the monitor. The patch cannot be worn during these tests.  You cannot remove and re-apply the ZIO XT patch monitor.   Your ZIO patch monitor will be sent USPS Priority mail from [facility] directly to your home address. The monitor may also be mailed to a PO BOX if home delivery is not available.   It may take 3-5 days to receive your monitor  after you have been enrolled.   Once you have received you monitor, please review enclosed instructions.  Your monitor has already been registered assigning a specific monitor serial # to you.   Applying the monitor   Shave hair from upper left chest.   Hold abrader disc by orange tab.  Rub abrader in 40 strokes over left upper chest as indicated in your monitor instructions.   Clean area with 4 enclosed alcohol pads .  Use all pads to assure are is cleaned thoroughly.  Let dry.   Apply patch as indicated in monitor instructions.  Patch will be place under collarbone on left side of chest with arrow pointing upward.   Rub patch adhesive wings for 2 minutes.Remove white label marked "1".  Remove white label marked "2".  Rub patch adhesive wings for 2 additional minutes.   While looking in a mirror, press and release button in center of patch.  A small green light will flash 3-4 times .  This will be your only indicator the monitor has been turned on.     Do not shower for the first 24 hours.  You may shower after the first 24 hours.   Press button if you feel a symptom. You will hear a small click.  Record Date, Time and Symptom in the Patient Log Book.   When you are ready to remove patch, follow instructions on last 2 pages of Patient Log Book.  Stick patch monitor onto last page of Patient Log Book.   Place Patient Log Book in [place] box.  Use locking tab on box and tape box closed securely.  The [employer] and [employer] has [employer] on it.  Please place in mailbox as soon as possible.  Your physician should have your test results approximately 7 days after the monitor has been mailed back to [facility].   Call [facility] Customer Care at [phone] if you have questions regarding your ZIO XT patch monitor.  Call them immediately if you see an orange light blinking on your monitor.   If your monitor falls off in less than 4 days contact our Monitor department at  [phone].  If your monitor becomes loose or falls off after 4 days call [employer] at [phone] for suggestions on securing your monitor.    Follow-Up: At [facility], you and your health needs are our priority.  As part of our continuing mission to provide you with exceptional heart care, we have created designated Provider Care Teams.  These Care Teams include your primary Cardiologist (physician) and Advanced Practice Providers (APPs -  Physician Assistants and Nurse Practitioners) who all work together to provide you with the care you need, when you need it.  We recommend signing up for the patient portal called "MyChart".  Sign up information is provided on this After Visit Summary.  MyChart is used to connect with patients for Virtual Visits (Telemedicine).  Patients are able to view lab/test results, encounter notes, upcoming appointments, etc.  Non-urgent messages can be sent to your provider as  well.   To learn more about what you can do with MyChart, go to [email or web address].    Your next appointment:   3 month(s)  The format for your next appointment:   In Person  Provider:   Laurance Flatten, MD   Other Instructions  Echocardiogram An echocardiogram is a procedure that uses painless sound waves (ultrasound) to produce an image of the heart. Images from an echocardiogram can provide important information about:  Signs of coronary artery disease (CAD).  Aneurysm detection. An aneurysm is a weak or damaged part of an artery wall that bulges out from the normal force of blood pumping through the body.  Heart size and shape. Changes in the size or shape of the heart can be associated with certain conditions, including heart failure, aneurysm, and CAD.  Heart muscle function.  Heart valve function.  Signs of a past heart attack.  Fluid buildup around the heart.  Thickening of the heart muscle.  A tumor or infectious growth around the heart valves. Tell a  health care provider about:  Any allergies you have.  All medicines you are taking, including vitamins, herbs, eye drops, creams, and over-the-counter medicines.  Any blood disorders you have.  Any surgeries you have had.  Any medical conditions you have.  Whether you are pregnant or may be pregnant. What are the risks? Generally, this is a safe procedure. However, problems may occur, including:  Allergic reaction to dye (contrast) that may be used during the procedure. What happens before the procedure? No specific preparation is needed. You may eat and drink normally. What happens during the procedure?   An IV tube may be inserted into one of your veins.  You may receive contrast through this tube. A contrast is an injection that improves the quality of the pictures from your heart.  A gel will be applied to your chest.  A wand-like tool (transducer) will be moved over your chest. The gel will help to transmit the sound waves from the transducer.  The sound waves will harmlessly bounce off of your heart to allow the heart images to be captured in real-time motion. The images will be recorded on a computer. The procedure may vary among health care providers and hospitals. What happens after the procedure?  You may return to your normal, everyday life, including diet, activities, and medicines, unless your health care provider tells you not to do that. Summary  An echocardiogram is a procedure that uses painless sound waves (ultrasound) to produce an image of the heart.  Images from an echocardiogram can provide important information about the size and shape of your heart, heart muscle function, heart valve function, and fluid buildup around your heart.  You do not need to do anything to prepare before this procedure. You may eat and drink normally.  After the echocardiogram is completed, you may return to your normal, everyday life, unless your health care provider tells  you not to do that. This information is not intended to replace advice given to you by your health care provider. Make sure you discuss any questions you have with your health care provider. Document Revised: 01/22/2019 Document Reviewed: 11/03/2016 Elsevier Patient Education  2020 Elsevier Inc.    Cardiac Nuclear Scan A cardiac nuclear scan is a test that is done to check the flow of blood to your heart. It is done when you are resting and when you are exercising. The test looks for problems such as:  Not enough blood  reaching a portion of the heart.  The heart muscle not working as it should. You may need this test if:  You have heart disease.  You have had lab results that are not normal.  You have had heart surgery or a balloon procedure to open up blocked arteries (angioplasty).  You have chest pain.  You have shortness of breath. In this test, a special dye (tracer) is put into your bloodstream. The tracer will travel to your heart. A camera will then take pictures of your heart to see how the tracer moves through your heart. This test is usually done at a hospital and takes 2-4 hours. Tell a doctor about:  Any allergies you have.  All medicines you are taking, including vitamins, herbs, eye drops, creams, and over-the-counter medicines.  Any problems you or family members have had with anesthetic medicines.  Any blood disorders you have.  Any surgeries you have had.  Any medical conditions you have.  Whether you are pregnant or may be pregnant. What are the risks? Generally, this is a safe test. However, problems may occur, such as:  Serious chest pain and heart attack. This is only a risk if the stress portion of the test is done.  Rapid heartbeat.  A feeling of warmth in your chest. This feeling usually does not last long.  Allergic reaction to the tracer. What happens before the test?  Ask your doctor about changing or stopping your normal medicines.  This is important.  Follow instructions from your doctor about what you cannot eat or drink.  Remove your jewelry on the day of the test. What happens during the test?  An IV tube will be inserted into one of your veins.  Your doctor will give you a small amount of tracer through the IV tube.  You will wait for 20-40 minutes while the tracer moves through your bloodstream.  Your heart will be monitored with an electrocardiogram (ECG).  You will lie down on an exam table.  Pictures of your heart will be taken for about 15-20 minutes.  You may also have a stress test. For this test, one of these things may be done: ? You will be asked to exercise on a treadmill or a stationary bike. ? You will be given medicines that will make your heart work harder. This is done if you are unable to exercise.  When blood flow to your heart has peaked, a tracer will again be given through the IV tube.  After 20-40 minutes, you will get back on the exam table. More pictures will be taken of your heart.  Depending on the tracer that is used, more pictures may need to be taken 3-4 hours later.  Your IV tube will be removed when the test is over. The test may vary among doctors and hospitals. What happens after the test?  Ask your doctor: ? Whether you can return to your normal schedule, including diet, activities, and medicines. ? Whether you should drink more fluids. This will help to remove the tracer from your body. Drink enough fluid to keep your pee (urine) pale yellow.  Ask your doctor, or the department that is doing the test: ? When will my results be ready? ? How will I get my results? Summary  A cardiac nuclear scan is a test that is done to check the flow of blood to your heart.  Tell your doctor whether you are pregnant or may be pregnant.  Before the test,  ask your doctor about changing or stopping your normal medicines. This is important.  Ask your doctor whether you can  return to your normal activities. You may be asked to drink more fluids. This information is not intended to replace advice given to you by your health care provider. Make sure you discuss any questions you have with your health care provider. Document Revised: 01/21/2019 Document Reviewed: 03/17/2018 Elsevier Patient Education  2020 [employer].     Signed, Meriam Sprague, MD  07/11/2020 2:12 PM    [facility]

## 2020-07-11 ENCOUNTER — Other Ambulatory Visit: Payer: Self-pay

## 2020-07-11 ENCOUNTER — Ambulatory Visit (INDEPENDENT_AMBULATORY_CARE_PROVIDER_SITE_OTHER): Payer: Medicare HMO | Admitting: Cardiology

## 2020-07-11 ENCOUNTER — Encounter: Payer: Self-pay | Admitting: Cardiology

## 2020-07-11 ENCOUNTER — Telehealth: Payer: Self-pay | Admitting: Radiology

## 2020-07-11 VITALS — HR 101 | Ht 63.0 in | Wt 162.4 lb

## 2020-07-11 DIAGNOSIS — R079 Chest pain, unspecified: Secondary | ICD-10-CM | POA: Diagnosis not present

## 2020-07-11 DIAGNOSIS — R55 Syncope and collapse: Secondary | ICD-10-CM

## 2020-07-11 DIAGNOSIS — R002 Palpitations: Secondary | ICD-10-CM

## 2020-07-11 DIAGNOSIS — I10 Essential (primary) hypertension: Secondary | ICD-10-CM | POA: Diagnosis not present

## 2020-07-11 MED ORDER — ASPIRIN EC 81 MG PO TBEC: 81.0000 mg | DELAYED_RELEASE_TABLET | Freq: Every day | ORAL | 3 refills | Status: DC

## 2020-07-11 NOTE — Patient Instructions (Signed)
Medication Instructions:  Your physician has recommended you make the following change in your medication:  1.  START Aspirin 81 mg taking 1 daily   *If you need a refill on your cardiac medications before your next appointment, please call your pharmacy*   Lab Work: None ordered  If you have labs (blood work) drawn today and your tests are completely normal, you will receive your results only by: Marland Kitchen MyChart Message (if you have MyChart) OR . A paper copy in the mail If you have any lab test that is abnormal or we need to change your treatment, we will call you to review the results.   Testing/Procedures: Your physician has requested that you have an echocardiogram. Echocardiography is a painless test that uses sound waves to create images of your heart. It provides your doctor with information about the size and shape of your heart and how well your heart's chambers and valves are working. This procedure takes approximately one hour. There are no restrictions for this procedure.    Your physician has requested that you have a lexiscan myoview. For further information please visit [email or web address] Please follow instruction sheet, BELOW:     You are scheduled for a Myocardial Perfusion Imaging Study.    Please arrive 15 minutes prior to your appointment time for registration and insurance purposes.  The test will take approximately 3 to 4 hours to complete; you may bring reading material.  If someone comes with you to your appointment, they will need to remain in the main lobby due to limited space in the testing area. **If you are pregnant or breastfeeding, please notify the nuclear lab prior to your appointment**  How to prepare for your Myocardial Perfusion Test: . Do not eat or drink 3 hours prior to your test, except you may have water. . Do not consume products containing caffeine (regular or decaffeinated) 12 hours prior to your test. (ex: coffee, chocolate, sodas, tea). Do  bring a list of your current medications with you.  If not listed below, you may take your medications as normal. . No tome Metoprololo  durante las 24 horas antes de la prueba. Traiga el medicamento a su cita, ya que es posible que tenga que tomarlo una vez que termine la prueba.  Hold the Losartan-HCTZ on the morning of your test  . Do wear comfortable clothes (no dresses or overalls) and walking shoes, tennis shoes preferred (No heels or open toe shoes are allowed). . Do NOT wear cologne, perfume, aftershave, or lotions (deodorant is allowed). . If these instructions are not followed, your test will have to be rescheduled.     ZIO XT- Long Term Monitor Instructions   Your physician has requested you wear your ZIO patch monito 14 days.   This is a single patch monitor.  [employer] supplies one patch monitor per enrollment.  Additional stickers are not available.   Please do not apply patch if you will be having a Nuclear Stress Test, Echocardiogram, Cardiac CT, MRI, or Chest Xray during the time frame you would be wearing the monitor. The patch cannot be worn during these tests.  You cannot remove and re-apply the ZIO XT patch monitor.   Your ZIO patch monitor will be sent USPS Priority mail from [facility] directly to your home address. The monitor may also be mailed to a PO BOX if home delivery is not available.   It may take 3-5 days to receive your monitor after you have been enrolled.  Once you have received you monitor, please review enclosed instructions.  Your monitor has already been registered assigning a specific monitor serial # to you.   Applying the monitor   Shave hair from upper left chest.   Hold abrader disc by orange tab.  Rub abrader in 40 strokes over left upper chest as indicated in your monitor instructions.   Clean area with 4 enclosed alcohol pads .  Use all pads to assure are is cleaned thoroughly.  Let dry.   Apply patch as indicated in monitor  instructions.  Patch will be place under collarbone on left side of chest with arrow pointing upward.   Rub patch adhesive wings for 2 minutes.Remove white label marked "1".  Remove white label marked "2".  Rub patch adhesive wings for 2 additional minutes.   While looking in a mirror, press and release button in center of patch.  A small green light will flash 3-4 times .  This will be your only indicator the monitor has been turned on.     Do not shower for the first 24 hours.  You may shower after the first 24 hours.   Press button if you feel a symptom. You will hear a small click.  Record Date, Time and Symptom in the Patient Log Book.   When you are ready to remove patch, follow instructions on last 2 pages of Patient Log Book.  Stick patch monitor onto last page of Patient Log Book.   Place Patient Log Book in [place] box.  Use locking tab on box and tape box closed securely.  The [employer] and [employer] has [employer] on it.  Please place in mailbox as soon as possible.  Your physician should have your test results approximately 7 days after the monitor has been mailed back to [facility].   Call [facility] Customer Care at [phone] if you have questions regarding your ZIO XT patch monitor.  Call them immediately if you see an orange light blinking on your monitor.   If your monitor falls off in less than 4 days contact our Monitor department at [phone].  If your monitor becomes loose or falls off after 4 days call [employer] at [phone] for suggestions on securing your monitor.    Follow-Up: At [facility], you and your health needs are our priority.  As part of our continuing mission to provide you with exceptional heart care, we have created designated Romonia Yanik Care Teams.  These Care Teams include your primary Cardiologist (physician) and Advanced Practice Providers (APPs -  Physician Assistants and Nurse Practitioners) who all work together to provide you with  the care you need, when you need it.  We recommend signing up for the patient portal called "MyChart".  Sign up information is provided on this After Visit Summary.  MyChart is used to connect with patients for Virtual Visits (Telemedicine).  Patients are able to view lab/test results, encounter notes, upcoming appointments, etc.  Non-urgent messages can be sent to your Jabier Deese as well.   To learn more about what you can do with MyChart, go to [email or web address].    Your next appointment:   3 month(s)  The format for your next appointment:   In Person  Sheetal Lyall:   Laurance Flatten, MD   Other Instructions  Echocardiogram An echocardiogram is a procedure that uses painless sound waves (ultrasound) to produce an image of the heart. Images from an echocardiogram can provide important information about:  Signs of coronary artery  disease (CAD).  Aneurysm detection. An aneurysm is a weak or damaged part of an artery wall that bulges out from the normal force of blood pumping through the body.  Heart size and shape. Changes in the size or shape of the heart can be associated with certain conditions, including heart failure, aneurysm, and CAD.  Heart muscle function.  Heart valve function.  Signs of a past heart attack.  Fluid buildup around the heart.  Thickening of the heart muscle.  A tumor or infectious growth around the heart valves. Tell a health care Kye Hedden about:  Any allergies you have.  All medicines you are taking, including vitamins, herbs, eye drops, creams, and over-the-counter medicines.  Any blood disorders you have.  Any surgeries you have had.  Any medical conditions you have.  Whether you are pregnant or may be pregnant. What are the risks? Generally, this is a safe procedure. However, problems may occur, including:  Allergic reaction to dye (contrast) that may be used during the procedure. What happens before the procedure? No specific  preparation is needed. You may eat and drink normally. What happens during the procedure?   An IV tube may be inserted into one of your veins.  You may receive contrast through this tube. A contrast is an injection that improves the quality of the pictures from your heart.  A gel will be applied to your chest.  A wand-like tool (transducer) will be moved over your chest. The gel will help to transmit the sound waves from the transducer.  The sound waves will harmlessly bounce off of your heart to allow the heart images to be captured in real-time motion. The images will be recorded on a computer. The procedure may vary among health care providers and hospitals. What happens after the procedure?  You may return to your normal, everyday life, including diet, activities, and medicines, unless your health care Amanii Snethen tells you not to do that. Summary  An echocardiogram is a procedure that uses painless sound waves (ultrasound) to produce an image of the heart.  Images from an echocardiogram can provide important information about the size and shape of your heart, heart muscle function, heart valve function, and fluid buildup around your heart.  You do not need to do anything to prepare before this procedure. You may eat and drink normally.  After the echocardiogram is completed, you may return to your normal, everyday life, unless your health care Celicia Minahan tells you not to do that. This information is not intended to replace advice given to you by your health care Markela Wee. Make sure you discuss any questions you have with your health care Shawnda Mauney. Document Revised: 01/22/2019 Document Reviewed: 11/03/2016 Elsevier Patient Education  2020 Elsevier Inc.    Cardiac Nuclear Scan A cardiac nuclear scan is a test that is done to check the flow of blood to your heart. It is done when you are resting and when you are exercising. The test looks for problems such as:  Not enough blood  reaching a portion of the heart.  The heart muscle not working as it should. You may need this test if:  You have heart disease.  You have had lab results that are not normal.  You have had heart surgery or a balloon procedure to open up blocked arteries (angioplasty).  You have chest pain.  You have shortness of breath. In this test, a special dye (tracer) is put into your bloodstream. The tracer will travel to your heart.  A camera will then take pictures of your heart to see how the tracer moves through your heart. This test is usually done at a hospital and takes 2-4 hours. Tell a doctor about:  Any allergies you have.  All medicines you are taking, including vitamins, herbs, eye drops, creams, and over-the-counter medicines.  Any problems you or family members have had with anesthetic medicines.  Any blood disorders you have.  Any surgeries you have had.  Any medical conditions you have.  Whether you are pregnant or may be pregnant. What are the risks? Generally, this is a safe test. However, problems may occur, such as:  Serious chest pain and heart attack. This is only a risk if the stress portion of the test is done.  Rapid heartbeat.  A feeling of warmth in your chest. This feeling usually does not last long.  Allergic reaction to the tracer. What happens before the test?  Ask your doctor about changing or stopping your normal medicines. This is important.  Follow instructions from your doctor about what you cannot eat or drink.  Remove your jewelry on the day of the test. What happens during the test?  An IV tube will be inserted into one of your veins.  Your doctor will give you a small amount of tracer through the IV tube.  You will wait for 20-40 minutes while the tracer moves through your bloodstream.  Your heart will be monitored with an electrocardiogram (ECG).  You will lie down on an exam table.  Pictures of your heart will be taken for  about 15-20 minutes.  You may also have a stress test. For this test, one of these things may be done: ? You will be asked to exercise on a treadmill or a stationary bike. ? You will be given medicines that will make your heart work harder. This is done if you are unable to exercise.  When blood flow to your heart has peaked, a tracer will again be given through the IV tube.  After 20-40 minutes, you will get back on the exam table. More pictures will be taken of your heart.  Depending on the tracer that is used, more pictures may need to be taken 3-4 hours later.  Your IV tube will be removed when the test is over. The test may vary among doctors and hospitals. What happens after the test?  Ask your doctor: ? Whether you can return to your normal schedule, including diet, activities, and medicines. ? Whether you should drink more fluids. This will help to remove the tracer from your body. Drink enough fluid to keep your pee (urine) pale yellow.  Ask your doctor, or the department that is doing the test: ? When will my results be ready? ? How will I get my results? Summary  A cardiac nuclear scan is a test that is done to check the flow of blood to your heart.  Tell your doctor whether you are pregnant or may be pregnant.  Before the test, ask your doctor about changing or stopping your normal medicines. This is important.  Ask your doctor whether you can return to your normal activities. You may be asked to drink more fluids. This information is not intended to replace advice given to you by your health care Merrillyn Ackerley. Make sure you discuss any questions you have with your health care Tahni Porchia. Document Revised: 01/21/2019 Document Reviewed: 03/17/2018 Elsevier Patient Education  2020 [employer]

## 2020-07-11 NOTE — Telephone Encounter (Signed)
Enrolled patient for a 14 day Zio XT  monitor to be mailed to patients home  °

## 2020-07-12 DIAGNOSIS — M6281 Muscle weakness (generalized): Secondary | ICD-10-CM | POA: Diagnosis not present

## 2020-07-12 DIAGNOSIS — R002 Palpitations: Secondary | ICD-10-CM | POA: Diagnosis not present

## 2020-07-12 DIAGNOSIS — I10 Essential (primary) hypertension: Secondary | ICD-10-CM | POA: Diagnosis not present

## 2020-07-12 DIAGNOSIS — M2550 Pain in unspecified joint: Secondary | ICD-10-CM | POA: Diagnosis not present

## 2020-07-12 DIAGNOSIS — M797 Fibromyalgia: Secondary | ICD-10-CM | POA: Diagnosis not present

## 2020-07-12 DIAGNOSIS — M810 Age-related osteoporosis without current pathological fracture: Secondary | ICD-10-CM | POA: Diagnosis not present

## 2020-07-12 DIAGNOSIS — K219 Gastro-esophageal reflux disease without esophagitis: Secondary | ICD-10-CM | POA: Diagnosis not present

## 2020-07-12 DIAGNOSIS — E559 Vitamin D deficiency, unspecified: Secondary | ICD-10-CM | POA: Diagnosis not present

## 2020-07-12 DIAGNOSIS — N8110 Cystocele, unspecified: Secondary | ICD-10-CM | POA: Diagnosis not present

## 2020-07-12 DIAGNOSIS — E785 Hyperlipidemia, unspecified: Secondary | ICD-10-CM | POA: Diagnosis not present

## 2020-07-13 ENCOUNTER — Other Ambulatory Visit (INDEPENDENT_AMBULATORY_CARE_PROVIDER_SITE_OTHER): Payer: Medicare HMO

## 2020-07-13 DIAGNOSIS — R002 Palpitations: Secondary | ICD-10-CM

## 2020-07-13 DIAGNOSIS — R079 Chest pain, unspecified: Secondary | ICD-10-CM | POA: Diagnosis not present

## 2020-07-13 DIAGNOSIS — N816 Rectocele: Secondary | ICD-10-CM | POA: Diagnosis not present

## 2020-07-18 ENCOUNTER — Other Ambulatory Visit: Payer: Self-pay

## 2020-07-18 ENCOUNTER — Ambulatory Visit: Payer: Medicare HMO | Attending: Family Medicine | Admitting: Physical Therapy

## 2020-07-18 ENCOUNTER — Encounter: Payer: Self-pay | Admitting: Physical Therapy

## 2020-07-18 DIAGNOSIS — R2689 Other abnormalities of gait and mobility: Secondary | ICD-10-CM | POA: Insufficient documentation

## 2020-07-18 DIAGNOSIS — M5441 Lumbago with sciatica, right side: Secondary | ICD-10-CM | POA: Diagnosis not present

## 2020-07-18 DIAGNOSIS — G8929 Other chronic pain: Secondary | ICD-10-CM | POA: Insufficient documentation

## 2020-07-18 DIAGNOSIS — M62830 Muscle spasm of back: Secondary | ICD-10-CM | POA: Diagnosis not present

## 2020-07-18 NOTE — Patient Instructions (Signed)
Access Code: [email or web address] URL: [email or web address] Date: 07/18/2020 Prepared by: Lorayne Bender  Exercises .Standing Glute Med Mobilization with Small Ball on Wall - 3 x daily - 7 x weekly - 1 sets - 1 reps - 2-3 minutes hold

## 2020-07-18 NOTE — Therapy (Signed)
[facility] [address] [place], [place], [address] Phone: [phone]   Fax:  [phone]  Physical Therapy Evaluation  Patient Details  Name: Amy Cline MRN: [id number] Date of Birth: 1956-01-13 Referring Provider (PT): Dr Norton Blizzard    Encounter Date: 07/18/2020   PT End of Session - 07/18/20 1357    Visit Number 1    Number of Visits 12    Authorization Type aetna, medicare and Medicaid    PT Start Time 1330    PT Stop Time 1415    PT Time Calculation (min) 45 min    Activity Tolerance Patient tolerated treatment well    Behavior During Therapy [facility] for tasks assessed/performed           Past Medical History:  Diagnosis Date  . Arthritis    Bil hands  . Bladder prolapse, female, acquired   . Breast mass    left  . Chronic headaches   . Coronary artery disease   . Fibromyalgia   . GERD (gastroesophageal reflux disease)   . Glaucoma   . Hyperlipemia   . Hypertension   . Insomnia   . Prediabetes   . Vitamin D deficiency     Past Surgical History:  Procedure Laterality Date  . ABDOMINAL HYSTERECTOMY  2008  . APPENDECTOMY    . ARTERY BIOPSY Right 09/16/2018   Procedure: BIOPSY TEMPORAL ARTERY RIGHT;  Surgeon: Maeola Harman, MD;  Location: [facility] OR;  Service: Vascular;  Laterality: Right;  . BREAST BIOPSY     benign/left breast  . COLONOSCOPY  07/2018  . VAGINAL PROLAPSE REPAIR N/A 12/22/2019   Procedure: RECTOCELE REPAIR WITH GRAFT AND VAGINAL VAULT SUSPENSION/CYSTOSCOPY;  Surgeon: Alfredo Martinez, MD;  Location: [facility] ORS;  Service: Urology;  Laterality: N/A;    There were no vitals filed for this visit.    Subjective Assessment - 07/18/20 1335    Subjective Patient had a fall in August and since that point has had increased lower back pain into her right leg.    Patient is accompained by: Interpreter   Jesus [phone]   Pertinent History Arthritis in the hands, Fibromyalgia, CAD, HTN,  diabetes,    How long can you sit comfortably? Has pain and numbness after < 5-10 minutes    How long can you stand comfortably? Standing for more then a few minutes in any position causes pain    How long can you walk comfortably? Patient can walk around the grocery store    Diagnostic tests Lumbar x-ray: nmoderate L/S degeneration    Patient Stated Goals less pain    Currently in Pain? Yes    Pain Score 7     Pain Location Back    Pain Orientation Right    Pain Descriptors / Indicators Aching    Pain Type Chronic pain    Pain Radiating Towards raidates into the right thigh    Pain Onset More than a month ago    Pain Frequency Constant    Aggravating Factors  sitting, lying down, standing in one spot    Pain Relieving Factors heat or ice only helps for a few minutes/ medication hels for a few minutes.    Effect of Pain on Daily Activities unable to work              [facility] PT Assessment - 07/18/20 0001      Assessment   Medical Diagnosis Low Back Pain     Referring Provider (PT)  Dr Norton Blizzard     Onset Date/Surgical Date 06/07/20    Hand Dominance Right    Next MD Visit October 13th     Prior Therapy Went to a chriopractor 2x but got no relief       Precautions   Precautions None      Restrictions   Weight Bearing Restrictions No      Balance Screen   Has the patient fallen in the past 6 months Yes   Septmeber 9th was the last time    How many times? 1   last year 3-4x    Has the patient had a decrease in activity level because of a fear of falling?  Yes    Is the patient reluctant to leave their home because of a fear of falling?  Yes      Home Environment   Living Environment Private residence      Prior Function   Level of Independence Independent    Vocation Unemployed    Vocation Requirements The patient worked at a caffateria in the school. She is not sure she will be able to go backj because of the lifting       Cognition   Overall Cognitive Status  Within Functional Limits for tasks assessed    Attention Focused    Focused Attention Appears intact    Memory Appears intact    Awareness Appears intact    Problem Solving Appears intact      Observation/Other Assessments   Observations frequently shifts in her chair.     Focus on Therapeutic Outcomes (FOTO)  65%  limitation 46% limitation       Sensation   Light Touch Appears Intact    Additional Comments pain and numbness down into her leg      Coordination   Gross Motor Movements are Fluid and Coordinated Yes    Fine Motor Movements are Fluid and Coordinated Yes      ROM / Strength   AROM / PROM / Strength AROM;PROM;Strength      AROM   AROM Assessment Site Lumbar    Lumbar Flexion 30 dgrees with significant pain     Lumbar Extension unable to extend past neutral     Lumbar - Right Rotation 75% limited     Lumbar - Left Rotation 75% limited       PROM   PROM Assessment Site Hip    Right/Left Hip Right;Left    Left Hip Flexion 44    Left Hip Internal Rotation  --   limited motion and signficant pain      Strength   Strength Assessment Site Hip;Knee;Ankle    Right/Left Hip Right;Left    Right Hip Flexion 3+/5    Right Hip ABduction 3/5    Right Hip ADduction 3+/5    Left Hip Flexion 4/5    Left Hip ABduction 4/5    Left Hip ADduction 4+/5    Right/Left Knee Right;Left      Palpation   Palpation comment significant tenderness to palpation in right gluteal, and lumbar spine;       Special Tests   Other special tests not perfromed 2nd to high level of irritability       Transfers   Comments slow transfer from sit to stand.                       Objective measurements completed on examination: See above findings.  PT Education - 07/18/20 1354    Education Details reviewed HEP and symptom mangement;    Person(s) Educated Patient    Methods Explanation;Demonstration;Verbal cues;Tactile cues;Handout    Comprehension  Verbalized understanding;Returned demonstration;Verbal cues required;Tactile cues required            PT Short Term Goals - 07/18/20 1606      PT SHORT TERM GOAL #1   Title Patient will increase lumbar flexion by 20 degrees    Time 3    Period Weeks    Status New    Target Date 08/08/20      PT SHORT TERM GOAL #2   Title Patient will increase right hip flexion by 30 degrees.    Time 3    Period Weeks    Status New    Target Date 08/08/20      PT SHORT TERM GOAL #3   Title PT will review FOTO with patient    Time 3    Period Weeks    Status New    Target Date 08/08/20      PT SHORT TERM GOAL #4   Title Patient will increase gross right LE strength to 4+/5    Time 3    Period Weeks    Status New    Target Date 08/08/20             PT Long Term Goals - 07/18/20 1609      PT LONG TERM GOAL #1   Title Patient will increase active and passive right hip flexion to 100 degrees in order to sit in a chair. without signifcnat pain    Time 6    Period Weeks    Status New    Target Date 08/29/20      PT LONG TERM GOAL #2   Title Patient will stand for 30 minutes without self report of pain    Time 6    Period Weeks    Status New    Target Date 08/29/20      PT LONG TERM GOAL #3   Title Patient will report <20/10 pain with no pain into her right leg while perfroming ADL's    Time 6    Period Weeks    Status New    Target Date 08/29/20                  Plan - 07/18/20 1357    Clinical Impression Statement Patient is a 64 year old female. She has a high level orf irritability with her symptoms. She has pain in the right side that radiates nto her right leg. She has increased pain with any prolonged positioning. She has significant limitations in all spine movmenet and right hip flexion and IR. She has limited rotation and right sinlge leg stance with gait. She would benefit from skilled therapy to improv e ability to stand, sit, and sleep. She was given a  limited HEP today 21nd to a high level of irritation following evalaution.    Personal Factors and Comorbidities Comorbidity 1;Comorbidity 2;Comorbidity 3+    Comorbidities fibromyalgia, vaginal prolapse repair, CAD, symcope with fainting,    Examination-Activity Limitations Bed Mobility;Sit;Carry;Squat;Stand;Lift;Locomotion Level;Transfers    Examination-Participation Restrictions Cleaning;Meal Prep;Community Activity;Laundry;Shop;Occupation    Stability/Clinical Decision Making Unstable/Unpredictable   high levels of pain with basic movements/ Parathesias at times   Clinical Decision Making Moderate    Rehab Potential Good    PT Frequency 2x / week    PT Duration  6 weeks    PT Treatment/Interventions ADLs/Self Care Home Management;Electrical Stimulation;Iontophoresis 4mg /ml Dexamethasone;Patient/family education;Therapeutic exercise;Therapeutic activities;Neuromuscular re-education;Passive range of motion;Taping;Manual techniques;Joint Manipulations    PT Next Visit Plan soft tissue mobilization to gluteal and lumbar spine, May need sidelying. Diffiult to assess motion prefference since everything hurt, she has very limited extension but also flexion. If she can tolerate prone consider LAD with occilations. May have to start on the left. Behgin light core strengthening as tolerated in whatever position she can tolerate. Consider modalities if pain level remains significnat.    PT Home Exercise Plan tennis ball trigger point release the only thing givewn 2nd to time and high irritability    Consulted and Agree with Plan of Care Patient           Patient will benefit from skilled therapeutic intervention in order to improve the following deficits and impairments:  Abnormal gait, Decreased range of motion, Difficulty walking, Decreased mobility, Decreased strength, Impaired sensation, Pain, Decreased activity tolerance, Decreased endurance, Increased muscle spasms  Visit Diagnosis: Chronic  right-sided low back pain with right-sided sciatica  Muscle spasm of back  Other abnormalities of gait and mobility     Problem List Patient Active Problem List   Diagnosis Date Noted  . Rectocele 12/22/2019  . Well woman exam 04/01/2019  . Right arm pain 03/26/2019  . Chronic migraine without aura without status migrainosus, not intractable 11/18/2018  . Rectocele, female 08/11/2018  . Scalp tenderness 08/11/2018  . Atypical chest pain 07/17/2016  . Essential hypertension 03/29/2016  . Fibromyalgia 03/29/2016  . Gastroesophageal reflux disease without esophagitis 03/29/2016  . Chronic tension-type headache, not intractable 08/16/2015    Dessie Comaavid J Leodan Bolyard PT DPT  07/18/2020, 4:36 PM  [facility] [address] [place], [place], [address] Phone: [phone]   Fax:  [phone]  Name: Amy Cline MRN: [id number] Date of Birth: [birth year]

## 2020-07-20 DIAGNOSIS — M62838 Other muscle spasm: Secondary | ICD-10-CM | POA: Diagnosis not present

## 2020-07-20 DIAGNOSIS — M2550 Pain in unspecified joint: Secondary | ICD-10-CM | POA: Diagnosis not present

## 2020-07-20 DIAGNOSIS — M5416 Radiculopathy, lumbar region: Secondary | ICD-10-CM | POA: Diagnosis not present

## 2020-07-20 DIAGNOSIS — Z6841 Body Mass Index (BMI) 40.0 and over, adult: Secondary | ICD-10-CM | POA: Diagnosis not present

## 2020-07-25 ENCOUNTER — Other Ambulatory Visit: Payer: Medicare HMO

## 2020-07-27 ENCOUNTER — Other Ambulatory Visit: Payer: Self-pay

## 2020-07-27 ENCOUNTER — Ambulatory Visit (INDEPENDENT_AMBULATORY_CARE_PROVIDER_SITE_OTHER): Payer: Medicare HMO | Admitting: Family Medicine

## 2020-07-27 VITALS — BP 112/88 | Ht 63.0 in | Wt 162.0 lb

## 2020-07-27 DIAGNOSIS — M545 Low back pain, unspecified: Secondary | ICD-10-CM | POA: Diagnosis not present

## 2020-07-27 DIAGNOSIS — G8929 Other chronic pain: Secondary | ICD-10-CM

## 2020-07-27 NOTE — Patient Instructions (Signed)
Continue with physical therapy - for now focus on the stretching exercises. Take the diclofenac twice a day with the cyclobenzaprine as needed. Discuss lyrica with your family doctor. Get the MRI on 10/29 and follow up with me after this to go over results and next steps.

## 2020-07-28 ENCOUNTER — Telehealth (HOSPITAL_COMMUNITY): Payer: Self-pay | Admitting: *Deleted

## 2020-07-28 ENCOUNTER — Encounter (HOSPITAL_COMMUNITY): Payer: Self-pay | Admitting: *Deleted

## 2020-07-28 ENCOUNTER — Encounter: Payer: Self-pay | Admitting: Family Medicine

## 2020-07-28 NOTE — Telephone Encounter (Signed)
Patient given detailed instructions per Myocardial Perfusion Study Information Sheet for the test on 08/03/20. Patient notified to arrive 15 minutes early and that it is imperative to arrive on time for appointment to keep from having the test rescheduled. ° If you need to cancel or reschedule your appointment, please call the office within 24 hours of your appointment. . Patient verbalized understanding. Amy Cline ° ° ° °

## 2020-07-28 NOTE — Progress Notes (Signed)
PCP: Norm Salt, PA  Subjective:   HPI: Patient is a 64 y.o. female here for low back pain.  9/15: Interpreter was present for today's visit. Patient reports she has had fairly severe low back pain with radiation into the right lower extremity for several weeks. She has known history of fibromyalgia but feels like this pain is different. Reports numbness and tingling down the right leg into the foot and the digits. No bowel or bladder dysfunction. No weakness. She is currently taking gabapentin. She had previously been on Cymbalta but states that she has stopped this.  Notes also indicate that at some point she was taking nortriptyline.  She is not certain if she is tried Lyrica. She states that this low back pain started after she suffered a fall in the bathtub. She is uncertain how she fell but states she just passed out and lost consciousness for a few minutes. Of concern she states that she had 3 episodes of sudden syncope last year and she does have known history of coronary artery disease but does not have a cardiologist currently. For the current pain she went to an outside facility on August 24 and was given prednisone 20 mg for a week and tizanidine which did not provide much relief. Then on September 8 she went to emergency department and was prescribed meloxicam and dexamethasone.  Is also seem to have not helped her very much.  10/13: Interpreter present for today's visit. Patient returns stating she feels about the same. Has done one visit of physical therapy so far. Doing some stretches at home and using tennis ball to roll on muscles of her back but feels this makes the pain worse Taking voltaren twice a day and has tried both baclofen and flexeril - feels the flexeril helps more Scheduled to have lumbar spine MRI on 10/29 - was moved back because she had a cardiac monitor. Taking ASA 81mg  now.  Past Medical History:  Diagnosis Date  . Arthritis    Bil hands   . Bladder prolapse, female, acquired   . Breast mass    left  . Chronic headaches   . Coronary artery disease   . Fibromyalgia   . GERD (gastroesophageal reflux disease)   . Glaucoma   . Hyperlipemia   . Hypertension   . Insomnia   . Prediabetes   . Vitamin D deficiency     Current Outpatient Medications on File Prior to Visit  Medication Sig Dispense Refill  . alendronate (FOSAMAX) 35 MG tablet Take 1 tablet (35 mg total) by mouth every 7 (seven) days. Take with a full glass of water on an empty stomach. 4 tablet 11  . aspirin EC 81 MG tablet Take 1 tablet (81 mg total) by mouth daily. Swallow whole. 90 tablet 3  . atorvastatin (LIPITOR) 20 MG tablet TAKE 1 TABLET BY MOUTH EVERY DAY 90 tablet 0  . baclofen (LIORESAL) 10 MG tablet Take 1 tablet (10 mg total) by mouth 3 (three) times daily as needed for muscle spasms. 60 each 1  . carboxymethylcellulose (REFRESH PLUS) 0.5 % SOLN 1 drop as needed.    . diclofenac (VOLTAREN) 75 MG EC tablet Take 1 tablet (75 mg total) by mouth 2 (two) times daily. 60 tablet 1  . DULoxetine (CYMBALTA) 60 MG capsule TAKE 1 CAPSULE BY MOUTH EVERY DAY 30 capsule 3  . gabapentin (NEURONTIN) 300 MG capsule Take 1 capsule (300 mg total) by mouth 3 (three) times daily. 270 capsule  3  . losartan-hydrochlorothiazide (HYZAAR) 50-12.5 MG tablet Take 1 tablet by mouth daily.    . metoprolol succinate (TOPROL-XL) 25 MG 24 hr tablet Take 25 mg by mouth daily.    . nortriptyline (PAMELOR) 25 MG capsule Take 25 mg by mouth at bedtime.    Marland Kitchen omeprazole (PRILOSEC) 20 MG capsule TAKE 1 CAPSULE BY MOUTH EVERY DAY. Please make PCP appointment. 30 capsule 0  . ondansetron (ZOFRAN-ODT) 4 MG disintegrating tablet Take 1 tablet (4 mg total) by mouth every 8 (eight) hours as needed for nausea. 30 tablet 3  . SUMAtriptan (IMITREX) 100 MG tablet Take 1 tablet (100 mg total) by mouth once as needed for up to 1 dose. May repeat in 2 hours if headache persists or recurs. 10 tablet 12    Current Facility-Administered Medications on File Prior to Visit  Medication Dose Route Frequency Provider Last Rate Last Admin  . 0.9 %  sodium chloride infusion  500 mL Intravenous Once Meryl Dare, MD        Past Surgical History:  Procedure Laterality Date  . ABDOMINAL HYSTERECTOMY  2008  . APPENDECTOMY    . ARTERY BIOPSY Right 09/16/2018   Procedure: BIOPSY TEMPORAL ARTERY RIGHT;  Surgeon: Maeola Harman, MD;  Location: [facility] OR;  Service: Vascular;  Laterality: Right;  . BREAST BIOPSY     benign/left breast  . COLONOSCOPY  07/2018  . VAGINAL PROLAPSE REPAIR N/A 12/22/2019   Procedure: RECTOCELE REPAIR WITH GRAFT AND VAGINAL VAULT SUSPENSION/CYSTOSCOPY;  Surgeon: Alfredo Martinez, MD;  Location: [facility];  Service: Urology;  Laterality: N/A;    No Known Allergies  Social History   Socioeconomic History  . Marital status: Legally Separated    Spouse name: Maisie Fus  . Number of children: 2  . Years of education: 52  . Highest education level: Not on file  Occupational History  . Occupation: Retired  Tobacco Use  . Smoking status: Never Smoker  . Smokeless tobacco: Never Used  Vaping Use  . Vaping Use: Never used  Substance and Sexual Activity  . Alcohol use: Not Currently    Comment: SOCIAL  . Drug use: No  . Sexual activity: Yes    Birth control/protection: Surgical  Other Topics Concern  . Not on file  Social History Narrative   Lives at home alone   Caffeine use: 1-2 cups per day   Social Determinants of Health   Financial Resource Strain:   . Difficulty of Paying Living Expenses: Not on file  Food Insecurity:   . Worried About Programme researcher, broadcasting/film/video in the Last Year: Not on file  . Ran Out of Food in the Last Year: Not on file  Transportation Needs:   . Lack of Transportation (Medical): Not on file  . Lack of Transportation (Non-Medical): Not on file  Physical Activity:   . Days of Exercise per Week: Not on file  . Minutes of Exercise per  Session: Not on file  Stress:   . Feeling of Stress : Not on file  Social Connections:   . Frequency of Communication with Friends and Family: Not on file  . Frequency of Social Gatherings with Friends and Family: Not on file  . Attends Religious Services: Not on file  . Active Member of Clubs or Organizations: Not on file  . Attends Banker Meetings: Not on file  . Marital Status: Not on file  Intimate Partner Violence:   . Fear of Current or Ex-Partner: Not  on file  . Emotionally Abused: Not on file  . Physically Abused: Not on file  . Sexually Abused: Not on file    Family History  Problem Relation Age of Onset  . Hypertension Mother   . Hypertension Father   . Migraines Neg Hx     BP 112/88   Ht 5\' 3"  (1.6 m)   Wt 162 lb (73.5 kg)   BMI 28.70 kg/m   No flowsheet data found.  No flowsheet data found.  Review of Systems: See HPI above.     Objective:  Physical Exam:  Gen: NAD, comfortable in exam room  Back: No gross deformity, scoliosis. TTP throughout back even with light palpation.  No midline or bony TTP. FROM. Strength LEs 5/5 all muscle groups.   Negative SLRs. Sensation intact to light touch bilaterally. Negative logroll bilateral hips  Assessment & Plan:  1. Low back pain with radiation into right lower extremity - Continue with diclofenac with flexeril as needed.  MRI pending for 10/29 - follow up after this to go over results.  Continue physical therapy.  Can discuss lyrica with PCP as well.

## 2020-07-29 ENCOUNTER — Ambulatory Visit: Payer: Medicare HMO | Admitting: Physical Therapy

## 2020-07-29 ENCOUNTER — Other Ambulatory Visit: Payer: Self-pay

## 2020-07-29 ENCOUNTER — Encounter: Payer: Self-pay | Admitting: Physical Therapy

## 2020-07-29 DIAGNOSIS — G8929 Other chronic pain: Secondary | ICD-10-CM

## 2020-07-29 DIAGNOSIS — R002 Palpitations: Secondary | ICD-10-CM | POA: Diagnosis not present

## 2020-07-29 DIAGNOSIS — R2689 Other abnormalities of gait and mobility: Secondary | ICD-10-CM

## 2020-07-29 DIAGNOSIS — M5441 Lumbago with sciatica, right side: Secondary | ICD-10-CM | POA: Diagnosis not present

## 2020-07-29 DIAGNOSIS — M62830 Muscle spasm of back: Secondary | ICD-10-CM

## 2020-07-29 NOTE — Therapy (Signed)
[facility] [address] [place], [place], [address] Phone: [phone]   Fax:  [phone]  Physical Therapy Treatment  Patient Details  Name: Amy Cline MRN: [id number] Date of Birth: Mar 22, 1956 Referring Provider (PT): Dr Norton Blizzard    Encounter Date: 07/29/2020   PT End of Session - 07/29/20 0940    Visit Number 2    Number of Visits 12    Date for PT Re-Evaluation 08/29/20    Authorization Type aetna, medicare and Medicaid    PT Start Time 0930    PT Stop Time 1015    PT Time Calculation (min) 45 min           Past Medical History:  Diagnosis Date  . Arthritis    Bil hands  . Bladder prolapse, female, acquired   . Breast mass    left  . Chronic headaches   . Coronary artery disease   . Fibromyalgia   . GERD (gastroesophageal reflux disease)   . Glaucoma   . Hyperlipemia   . Hypertension   . Insomnia   . Prediabetes   . Vitamin D deficiency     Past Surgical History:  Procedure Laterality Date  . ABDOMINAL HYSTERECTOMY  2008  . APPENDECTOMY    . ARTERY BIOPSY Right 09/16/2018   Procedure: BIOPSY TEMPORAL ARTERY RIGHT;  Surgeon: Maeola Harman, MD;  Location: [facility] OR;  Service: Vascular;  Laterality: Right;  . BREAST BIOPSY     benign/left breast  . COLONOSCOPY  07/2018  . VAGINAL PROLAPSE REPAIR N/A 12/22/2019   Procedure: RECTOCELE REPAIR WITH GRAFT AND VAGINAL VAULT SUSPENSION/CYSTOSCOPY;  Surgeon: Alfredo Martinez, MD;  Location: [facility] OR[facility];  Service: Urology;  Laterality: N/A;    There were no vitals filed for this visit.   Subjective Assessment - 07/29/20 0937    Subjective I have done the exercises but they are very painful. When I use the ball in the lumbar area it hurts alot.    Currently in Pain? Yes    Pain Score 8     Pain Location Back    Pain Orientation Right    Pain Descriptors / Indicators Aching    Pain Type Chronic pain    Pain Radiating Towards radiates into right  thigh              [facility] PT Assessment - 07/29/20 0001      AROM   AROM Assessment Site Hip                         [facility] Adult PT Treatment/Exercise - 07/29/20 0001      Lumbar Exercises: Supine   Pelvic Tilt 10 reps    Clam 20 reps    Heel Slides 5 reps    Bent Knee Raise 20 reps    Other Supine Lumbar Exercises Gentle rocking into LTR      Modalities   Modalities Cryotherapy;Electrical Stimulation      Cryotherapy   Number Minutes Cryotherapy 15 Minutes    Cryotherapy Location --   right gluteal, sitting   Type of Cryotherapy Ice pack      Electrical Stimulation   Electrical Stimulation Location Right Gluteal in sitting     Electrical Stimulation Action IFC x 15 min    Electrical Stimulation Parameters 52mA    Electrical Stimulation Goals Pain      Manual Therapy   Manual Therapy Soft tissue mobilization  Soft tissue mobilization attempted gentle STW in sidelying -unable to tolerate, reviewed using tennis ball or self massage to decrease sensitivity                    PT Short Term Goals - 07/18/20 1606      PT SHORT TERM GOAL #1   Title Patient will increase lumbar flexion by 20 degrees    Time 3    Period Weeks    Status New    Target Date 08/08/20      PT SHORT TERM GOAL #2   Title Patient will increase right hip flexion by 30 degrees.    Time 3    Period Weeks    Status New    Target Date 08/08/20      PT SHORT TERM GOAL #3   Title PT will review FOTO with patient    Time 3    Period Weeks    Status New    Target Date 08/08/20      PT SHORT TERM GOAL #4   Title Patient will increase gross right LE strength to 4+/5    Time 3    Period Weeks    Status New    Target Date 08/08/20             PT Long Term Goals - 07/18/20 1609      PT LONG TERM GOAL #1   Title Patient will increase active and passive right hip flexion to 100 degrees in order to sit in a chair. without signifcnat pain    Time 6    Period  Weeks    Status New    Target Date 08/29/20      PT LONG TERM GOAL #2   Title Patient will stand for 30 minutes without self report of pain    Time 6    Period Weeks    Status New    Target Date 08/29/20      PT LONG TERM GOAL #3   Title Patient will report <20/10 pain with no pain into her right leg while perfroming ADL's    Time 6    Period Weeks    Status New    Target Date 08/29/20                 Plan - 07/29/20 1023    Clinical Impression Statement Pt report tennis ball causes significant increase in pain. Initiated HEP today with supine gentle AROM and abdominal bracing. Every movement causes increased pain but she was able to complete exercises. Slow transitions into sidyling and into sitting. Unable to tolerate light manual and she was advised to desensitize the area with light touch. Trial of IFC in sitting position. Will assess response next session.    PT Next Visit Plan assess response to IFC, soft tissue mobilization/desensitize to gluteal and lumbar spine, May need sidelying. Diffiult to assess motion prefference since everything hurt, she has very limited extension but also flexion. If she can tolerate prone consider LAD with occilations. May have to start on the left. Behgin light core strengthening as tolerated in whatever position she can tolerate. Consider modalities if pain level remains significnat.    PT Home Exercise Plan tennis ball trigger point release the only thing givewn 2nd to time and high irritability, added supine march , supine clam AROM, pelvic tiliting and smal LTR rocking           Patient will benefit from skilled therapeutic  intervention in order to improve the following deficits and impairments:  Abnormal gait, Decreased range of motion, Difficulty walking, Decreased mobility, Decreased strength, Impaired sensation, Pain, Decreased activity tolerance, Decreased endurance, Increased muscle spasms  Visit Diagnosis: Chronic right-sided low  back pain with right-sided sciatica  Muscle spasm of back  Other abnormalities of gait and mobility     Problem List Patient Active Problem List   Diagnosis Date Noted  . Rectocele 12/22/2019  . Well woman exam 04/01/2019  . Right arm pain 03/26/2019  . Chronic migraine without aura without status migrainosus, not intractable 11/18/2018  . Rectocele, female 08/11/2018  . Scalp tenderness 08/11/2018  . Atypical chest pain 07/17/2016  . Essential hypertension 03/29/2016  . Fibromyalgia 03/29/2016  . Gastroesophageal reflux disease without esophagitis 03/29/2016  . Chronic tension-type headache, not intractable 08/16/2015    Sherrie Mustache, PTA 07/29/2020, 11:17 AM  [facility] [address] [place], [place], [address] Phone: [phone]   Fax:  [phone]  Name: Amy Cline MRN: [id number] Date of Birth: [birth year]

## 2020-08-03 ENCOUNTER — Ambulatory Visit (HOSPITAL_COMMUNITY): Payer: Medicare HMO | Attending: Cardiology

## 2020-08-03 ENCOUNTER — Ambulatory Visit (HOSPITAL_BASED_OUTPATIENT_CLINIC_OR_DEPARTMENT_OTHER): Payer: Medicare HMO

## 2020-08-03 ENCOUNTER — Other Ambulatory Visit: Payer: Self-pay

## 2020-08-03 VITALS — Ht 63.0 in | Wt 162.0 lb

## 2020-08-03 DIAGNOSIS — R002 Palpitations: Secondary | ICD-10-CM | POA: Diagnosis not present

## 2020-08-03 DIAGNOSIS — R110 Nausea: Secondary | ICD-10-CM | POA: Insufficient documentation

## 2020-08-03 DIAGNOSIS — R079 Chest pain, unspecified: Secondary | ICD-10-CM | POA: Diagnosis not present

## 2020-08-03 LAB — MYOCARDIAL PERFUSION IMAGING
LV dias vol: 43 mL (ref 46–106)
LV sys vol: 21 mL
Peak HR: 106 {beats}/min
Rest HR: 72 {beats}/min
SDS: 1
SRS: 0
SSS: 1
TID: 0.98

## 2020-08-03 LAB — ECHOCARDIOGRAM COMPLETE
Area-P 1/2: 2.73 cm2
Height: 63 in
S' Lateral: 2.7 cm
Weight: 2592 oz

## 2020-08-03 MED ORDER — TECHNETIUM TC 99M TETROFOSMIN IV KIT
30.6000 | PACK | Freq: Once | INTRAVENOUS | Status: AC | PRN
  Administered 2020-08-03: 30.6 via INTRAVENOUS
  Filled 2020-08-03: qty 31

## 2020-08-03 MED ORDER — TECHNETIUM TC 99M TETROFOSMIN IV KIT
10.2000 | PACK | Freq: Once | INTRAVENOUS | Status: AC | PRN
  Administered 2020-08-03: 10.2 via INTRAVENOUS
  Filled 2020-08-03: qty 11

## 2020-08-03 MED ORDER — AMINOPHYLLINE 25 MG/ML IV SOLN
75.0000 mg | Freq: Once | INTRAVENOUS | Status: AC
  Administered 2020-08-03: 75 mg via INTRAVENOUS

## 2020-08-03 MED ORDER — REGADENOSON 0.4 MG/5ML IV SOLN
0.4000 mg | Freq: Once | INTRAVENOUS | Status: AC
  Administered 2020-08-03: 0.4 mg via INTRAVENOUS

## 2020-08-04 ENCOUNTER — Ambulatory Visit: Payer: Medicare HMO | Admitting: Physical Therapy

## 2020-08-11 ENCOUNTER — Ambulatory Visit: Payer: Medicare HMO | Admitting: Physical Therapy

## 2020-08-11 ENCOUNTER — Other Ambulatory Visit: Payer: Self-pay

## 2020-08-11 DIAGNOSIS — G8929 Other chronic pain: Secondary | ICD-10-CM | POA: Diagnosis not present

## 2020-08-11 DIAGNOSIS — M5441 Lumbago with sciatica, right side: Secondary | ICD-10-CM | POA: Diagnosis not present

## 2020-08-11 DIAGNOSIS — R2689 Other abnormalities of gait and mobility: Secondary | ICD-10-CM | POA: Diagnosis not present

## 2020-08-11 DIAGNOSIS — M62830 Muscle spasm of back: Secondary | ICD-10-CM | POA: Diagnosis not present

## 2020-08-11 NOTE — Therapy (Addendum)
[place], [place], [address] Phone: [phone]   Fax:  [phone]  Physical Therapy Treatment/Discharge   Patient Details  Name: Amy Cline MRN: [id number] Date of Birth: 26-Jul-1956 Referring Provider (PT): Dr Karlton Lemon    Encounter Date: 08/11/2020   PT End of Session - 08/11/20 1406    Visit Number 3    Number of Visits 12    Date for PT Re-Evaluation 08/29/20    Authorization Type aetna, medicare and Medicaid    PT Start Time 1330    PT Stop Time 1358    PT Time Calculation (min) 28 min    Activity Tolerance Patient tolerated treatment well    Behavior During Therapy [facility] for tasks assessed/performed           Past Medical History:  Diagnosis Date  . Arthritis    Bil hands  . Bladder prolapse, female, acquired   . Breast mass    left  . Chronic headaches   . Coronary artery disease   . Fibromyalgia   . GERD (gastroesophageal reflux disease)   . Glaucoma   . Hyperlipemia   . Hypertension   . Insomnia   . Prediabetes   . Vitamin D deficiency     Past Surgical History:  Procedure Laterality Date  . ABDOMINAL HYSTERECTOMY  2008  . APPENDECTOMY    . ARTERY BIOPSY Right 09/16/2018   Procedure: BIOPSY TEMPORAL ARTERY RIGHT;  Surgeon: Waynetta Sandy, MD;  Location: [place];  Service: Vascular;  Laterality: Right;  . BREAST BIOPSY     benign/left breast  . COLONOSCOPY  07/2018  . VAGINAL PROLAPSE REPAIR N/A 12/22/2019   Procedure: RECTOCELE REPAIR WITH GRAFT AND VAGINAL VAULT SUSPENSION/CYSTOSCOPY;  Surgeon: Bjorn Loser, MD;  Location: [facility];  Service: Urology;  Laterality: N/A;    There were no vitals filed for this visit.                      [facility] Adult PT Treatment/Exercise - 08/11/20 0001      Self-Care   Self-Care Other Self-Care Comments    Other Self-Care Comments  spent tiem reviewing the improtance of improving movement in low back  and hip; improving core strength; use of program. All of her exercises at this time are geared towards decreasing pain and inflammation but per patient they are all causing pain.       Exercises   Exercises Lumbar      Lumbar Exercises: Seated   Other Seated Lumbar Exercises low range ball roll with cuing to stay in a pain free range 2x5 forward 2x5 to the left x5 to the right. All caused pain despite cuing; light ball press with abdominal breathing, caused increased pain in her neck and shoulder.       Manual Therapy   Manual Therapy Manual Traction    Manual Traction LAD focused mostly on the left 2nd to sensativity on the right. She was able to tolerate on the right but after several minutes reported she could not tolerate positioning on a wedge.                     PT Short Term Goals - 07/18/20 1606      PT SHORT TERM GOAL #1   Title Patient will increase lumbar flexion by 20 degrees    Time 3    Period Weeks    Status New  Target Date 08/08/20      PT SHORT TERM GOAL #2   Title Patient will increase right hip flexion by 30 degrees.    Time 3    Period Weeks    Status New    Target Date 08/08/20      PT SHORT TERM GOAL #3   Title PT will review FOTO with patient    Time 3    Period Weeks    Status New    Target Date 08/08/20      PT SHORT TERM GOAL #4   Title Patient will increase gross right LE strength to 4+/5    Time 3    Period Weeks    Status New    Target Date 08/08/20             PT Long Term Goals - 07/18/20 1609      PT LONG TERM GOAL #1   Title Patient will increase active and passive right hip flexion to 100 degrees in order to sit in a chair. without signifcnat pain    Time 6    Period Weeks    Status New    Target Date 08/29/20      PT LONG TERM GOAL #2   Title Patient will stand for 30 minutes without self report of pain    Time 6    Period Weeks    Status New    Target Date 08/29/20      PT LONG TERM GOAL #3   Title  Patient will report <20/10 pain with no pain into her right leg while perfroming ADL's    Time 6    Period Weeks    Status New    Target Date 08/29/20                 Plan - 08/11/20 1346    Clinical Impression Statement Patient continues to have limited tolerance to low level mmovement and liught manual therap. She has diffciulty in supine even in a semi-recumbant position. She was unable to tolerate exercise ball rolling in any plane in limited motion. All movements she reported increased the pain into her leg. She reports increased numbness. Sefl trigger point release with a tennis ball causes her a stabbing pain like she is being stuck with a needle. Session was shortnened today 2nd to lack of benefit and increased radicular symptoms with all motion    Personal Factors and Comorbidities Other    Comorbidities fibromyalgia, vaginal prolapse repair, CAD, symcope with fainting,    Examination-Activity Limitations Bed Mobility;Sit;Carry;Squat;Stand;Lift;Locomotion Level;Transfers    Stability/Clinical Decision Making Unstable/Unpredictable    Clinical Decision Making Moderate    Rehab Potential Good    PT Frequency 2x / week    PT Duration 6 weeks    PT Treatment/Interventions ADLs/Self Care Home Management;Electrical Stimulation;Iontophoresis 94m/ml Dexamethasone;Patient/family education;Therapeutic exercise;Therapeutic activities;Neuromuscular re-education;Passive range of motion;Taping;Manual techniques;Joint Manipulations    PT Next Visit Plan sent back for re-assement    PT Home Exercise Plan tennis ball trigger point release the only thing givewn 2nd to time and high irritability, added supine march , supine clam AROM, pelvic tiliting and smal LTR rocking    Consulted and Agree with Plan of Care Patient           Patient will benefit from skilled therapeutic intervention in order to improve the following deficits and impairments:  Abnormal gait, Decreased range of motion,  Difficulty walking, Decreased mobility, Decreased strength, Impaired sensation, Pain, Decreased activity  tolerance, Decreased endurance, Increased muscle spasms  Visit Diagnosis: Chronic right-sided low back pain with right-sided sciatica  Muscle spasm of back  Other abnormalities of gait and mobility  PHYSICAL THERAPY DISCHARGE SUMMARY  Visits from Start of Care: 3  Current functional level related to goals / functional outcomes: Continued to have low tolerance to activity   Remaining deficits: Patient sent back to MD   Education / Equipment: HEP   Plan: Patient agrees to discharge.  Patient goals were partially met. Patient is being discharged due to meeting the stated rehab goals.  ?????       Problem List Patient Active Problem List   Diagnosis Date Noted  . Rectocele 12/22/2019  . Well woman exam 04/01/2019  . Right arm pain 03/26/2019  . Chronic migraine without aura without status migrainosus, not intractable 11/18/2018  . Rectocele, female 08/11/2018  . Scalp tenderness 08/11/2018  . Atypical chest pain 07/17/2016  . Essential hypertension 03/29/2016  . Fibromyalgia 03/29/2016  . Gastroesophageal reflux disease without esophagitis 03/29/2016  . Chronic tension-type headache, not intractable 08/16/2015    Carney Living PT DPT  08/11/2020, 2:15 PM  [facility] [address] [place], [place], [address] Phone: [phone]   Fax:  [phone]  Name: Amy Cline MRN: [id number] Date of Birth: 1956/05/16

## 2020-08-12 ENCOUNTER — Ambulatory Visit
Admission: RE | Admit: 2020-08-12 | Discharge: 2020-08-12 | Disposition: A | Discharge status: Home / Self Care | Payer: Medicare HMO | Source: Ambulatory Visit | Attending: Family Medicine | Admitting: Family Medicine

## 2020-08-12 DIAGNOSIS — M545 Low back pain, unspecified: Secondary | ICD-10-CM

## 2020-08-12 DIAGNOSIS — G8929 Other chronic pain: Secondary | ICD-10-CM

## 2020-08-12 IMAGING — MR MR LUMBAR SPINE W/O CM
4 of 5 series · 26 of 48 positions shown · non-contrast
Comparison: [DATE] lumbar spine radiographs and prior.

CLINICAL DATA: Low back pain.  Rule out disc herniation.

EXAM:
MRI LUMBAR SPINE WITHOUT CONTRAST
TECHNIQUE: Multiplanar, multisequence MR imaging of the lumbar spine was
performed. No intravenous contrast was administered.

[Series 3: T2 · sagittal · 4.0mm · 1.09mm/px · 6 of 17 slices shown (1 of 2)]
[im 1/17]
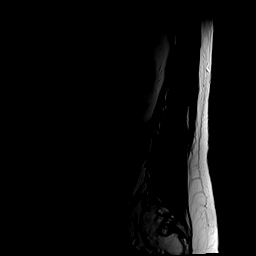
[im 4/17]
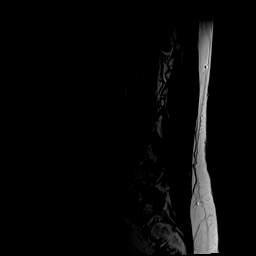
[im 7/17]
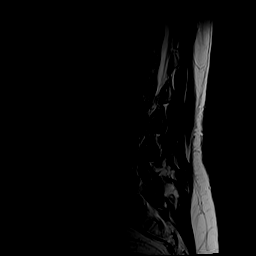
[im 10/17]
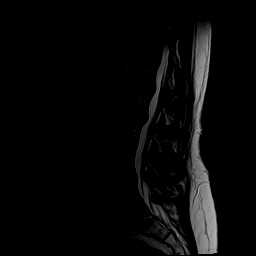
[im 13/17]
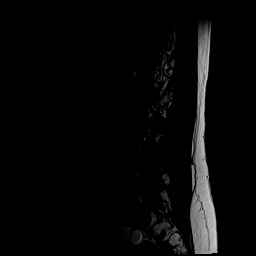
[im 17/17]
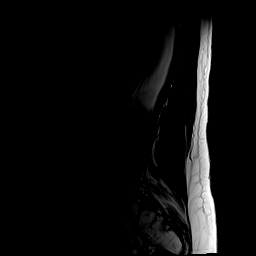

[Series 5: T1 · sagittal · 4.0mm · 1.09mm/px · 6 of 17 slices shown (1 of 2)]
[im 1/17]
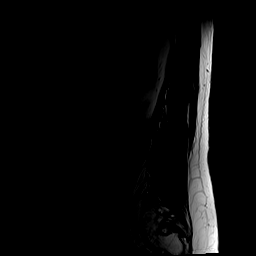
[im 4/17]
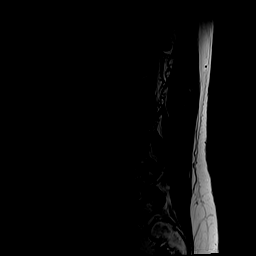
[im 7/17]
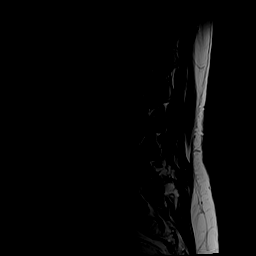
[im 10/17]
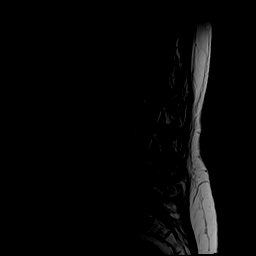
[im 13/17]
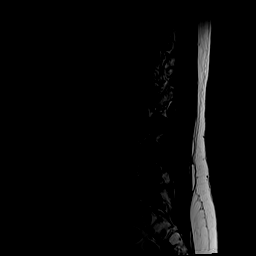
[im 17/17]
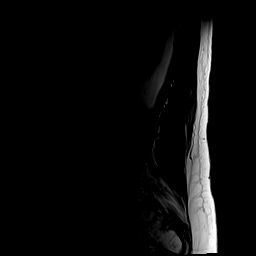

[Series 6: T2 · axial · 4.0mm · 0.39mm/px · z∈[-42,+177]mm · 9 of 42 slices shown (2 of 2)]
[im 1/42]
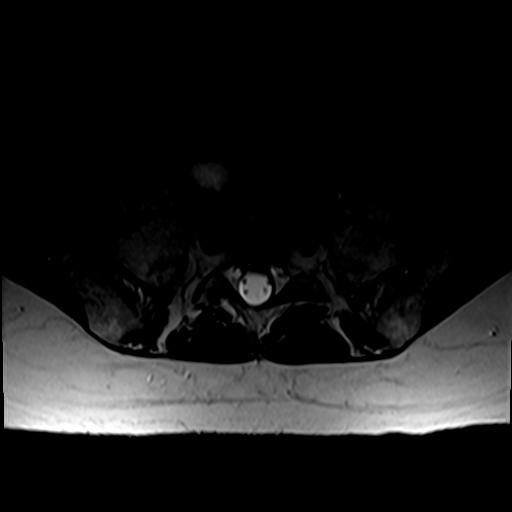
[im 6/42]
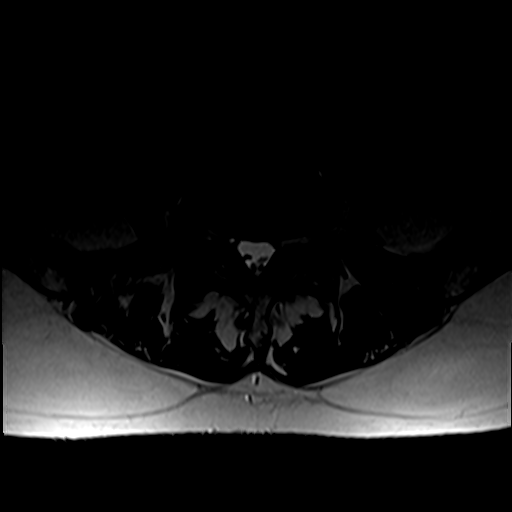
[im 12/42]
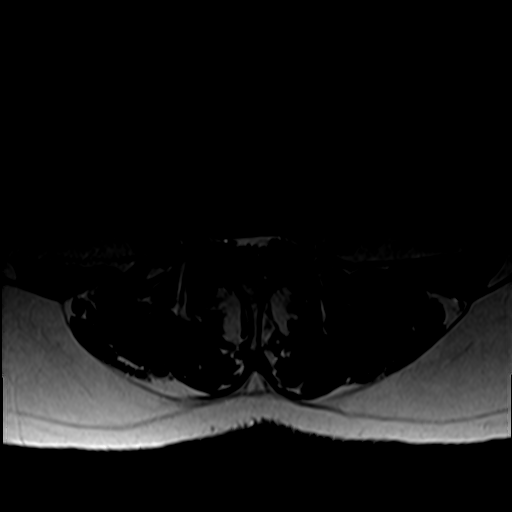
[im 18/42]
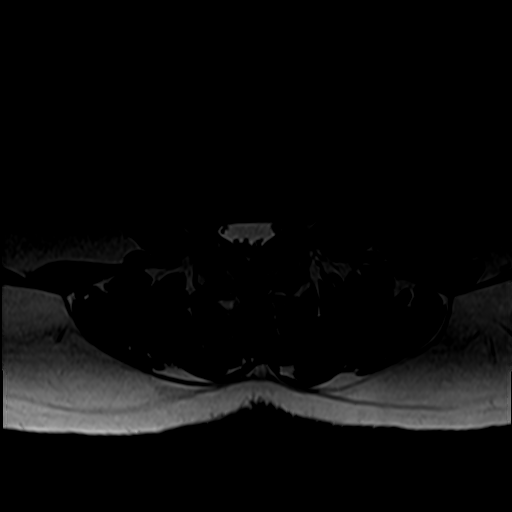
[im 21/42]
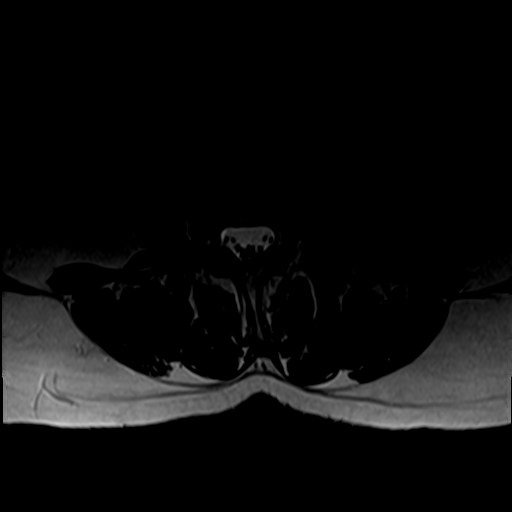
[im 24/42]
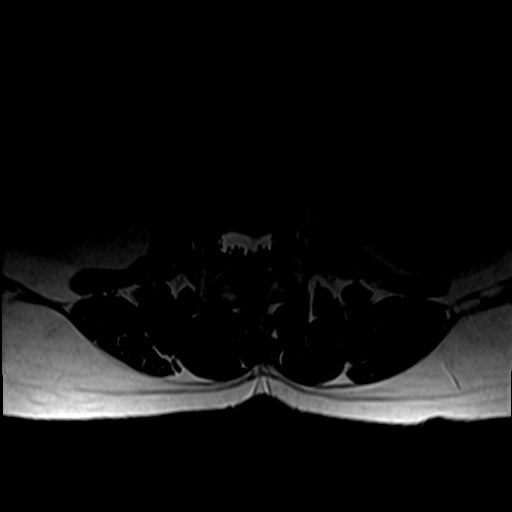
[im 30/42]
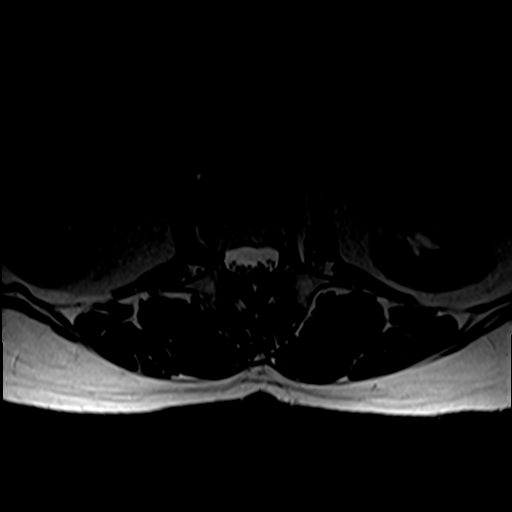
[im 36/42]
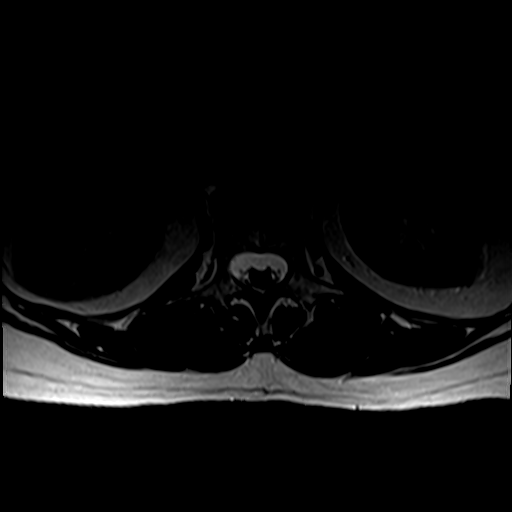
[im 42/42]
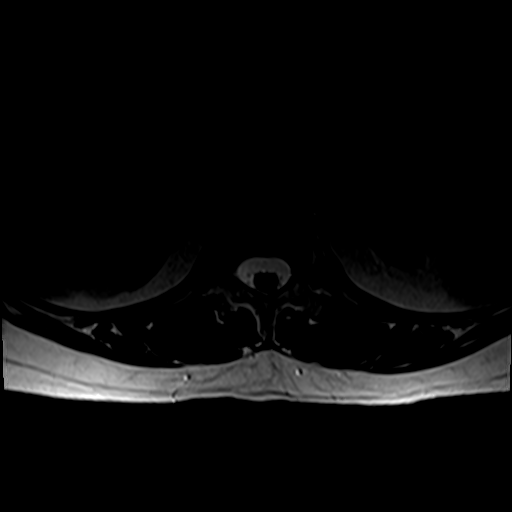

[Series 7: T1 · axial · 4.0mm · 0.39mm/px · z∈[-42,+149]mm · 5 of 42 slices shown (2 of 2)]
[im 1/42]
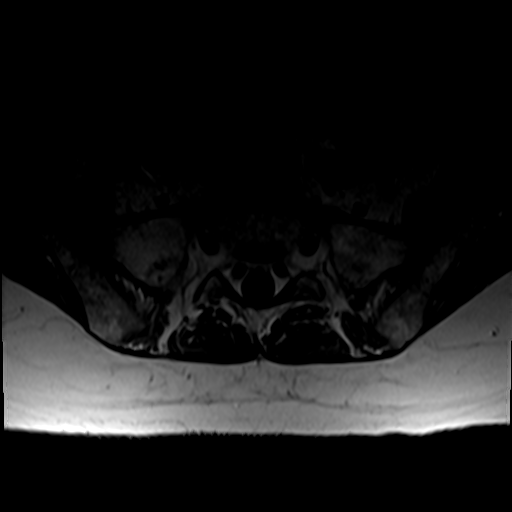
[im 6/42]
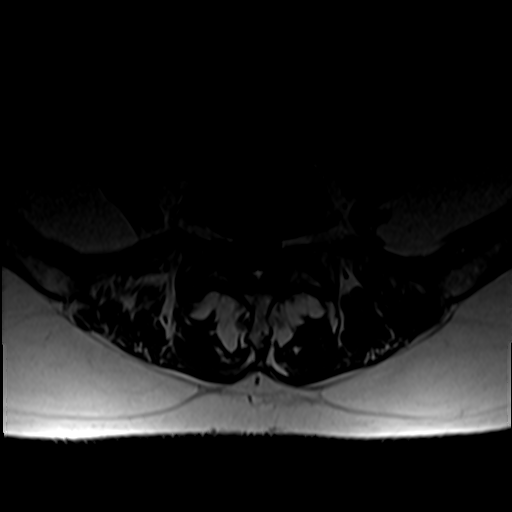
[im 12/42]
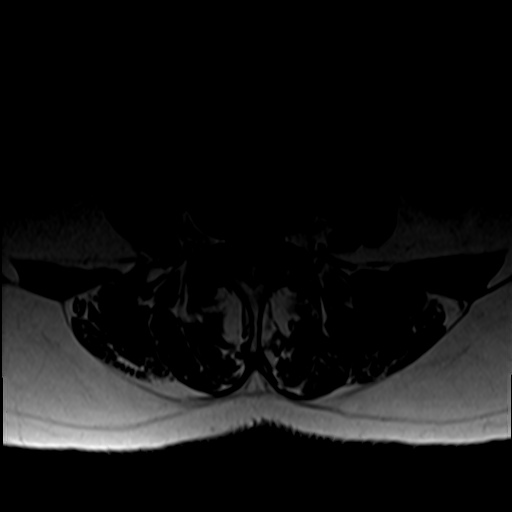
[im 21/42]
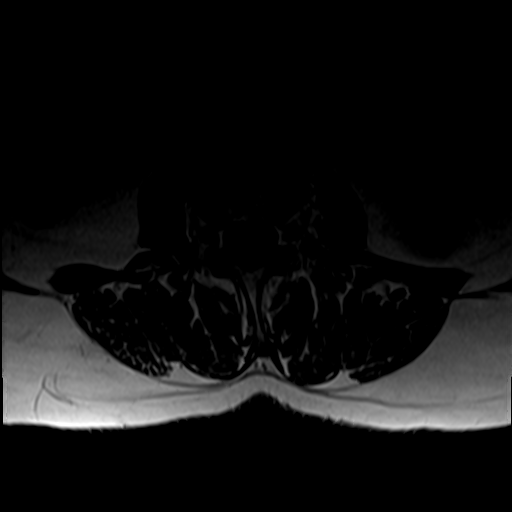
[im 36/42]
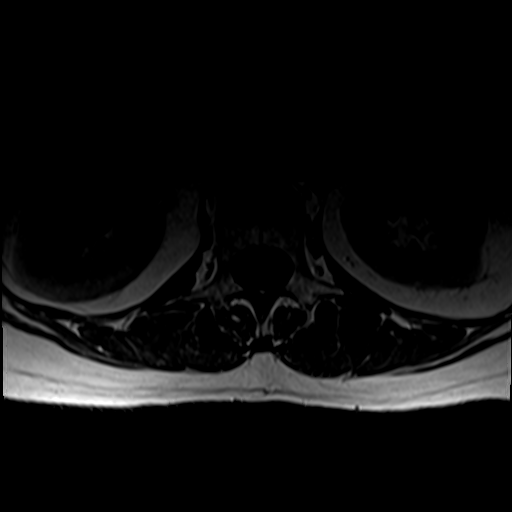

[26 of 48 positions shown; findings below may reference images not displayed]

FINDINGS: Segmentation:  Standard.

Alignment: Straightening of lordosis. Stepwise grade 1
retrolisthesis at the L1-2 and L2-3 levels. Minimal grade 1 L4-5
anterolisthesis.

Vertebrae: Normal bone marrow signal intensity. No focal osseous
lesions.

Conus medullaris and cauda equina: Conus extends to the L2 level.
Conus and cauda equina appear normal.

Disc levels: Multilevel desiccation and disc space loss most
prominent at the L2-3 level.

L1-2: Disc bulge and bilateral facet degenerative spurring.
Superimposed left paracentral inferiorly migrated extrusion
partially effacing the ventral CSF containing spaces. Mild spinal
canal narrowing. Patent neural foramen.

L2-3: Disc bulge, ligamentum flavum thickening and bilateral facet
hypertrophy. Superimposed inferiorly migrated central extrusion
partially effacing the ventral CSF containing spaces. Mild spinal
canal and bilateral neural foraminal narrowing.

L3-4: Mild disc bulge, prominent ligamentum flavum and bilateral
facet hypertrophy. Patent spinal canal and neural foramen.

L4-5: Disc bulge, ligamentum flavum and bilateral facet hypertrophy.
Mild spinal canal and bilateral neural foraminal narrowing.

L5-S1: Disc bulge with shallow central protrusion. Left paracentral
annular fissuring. Ligamentum flavum and bilateral facet
hypertrophy. Patent spinal canal and left neural foramen. Mild right
neural foraminal narrowing.

Paraspinal and other soft tissues: Paraspinal soft tissues within
normal limits. 2.8 x 1.7 cm partially imaged right hemipelvic cystic
lesion may involve the right adnexa. (4: 5).
IMPRESSION: Inferiorly migrated left L1-2 paracentral and L2-3 central
extrusions. Mild L1-3 spinal canal and bilateral L2-3 neural
foraminal narrowing.

Mild spinal canal and neural foraminal narrowing at the L4-5 level.

Mild right L5-S1 neural foraminal narrowing.

2.8 cm right hemipelvic cystic lesion is partially imaged and may
involve the right adnexa. Consider dedicated pelvic ultrasound for
further evaluation.

These results will be called to the ordering clinician or
representative by the Radiologist Assistant, and communication
documented in the PACS or [REDACTED].

## 2020-08-17 ENCOUNTER — Encounter: Payer: Medicare HMO | Admitting: Physical Therapy

## 2020-08-17 DIAGNOSIS — M797 Fibromyalgia: Secondary | ICD-10-CM | POA: Diagnosis not present

## 2020-08-17 DIAGNOSIS — M810 Age-related osteoporosis without current pathological fracture: Secondary | ICD-10-CM | POA: Diagnosis not present

## 2020-08-17 DIAGNOSIS — E559 Vitamin D deficiency, unspecified: Secondary | ICD-10-CM | POA: Diagnosis not present

## 2020-08-17 DIAGNOSIS — E785 Hyperlipidemia, unspecified: Secondary | ICD-10-CM | POA: Diagnosis not present

## 2020-08-17 DIAGNOSIS — M722 Plantar fascial fibromatosis: Secondary | ICD-10-CM | POA: Diagnosis not present

## 2020-08-17 DIAGNOSIS — R002 Palpitations: Secondary | ICD-10-CM | POA: Diagnosis not present

## 2020-08-17 DIAGNOSIS — I10 Essential (primary) hypertension: Secondary | ICD-10-CM | POA: Diagnosis not present

## 2020-08-17 DIAGNOSIS — K219 Gastro-esophageal reflux disease without esophagitis: Secondary | ICD-10-CM | POA: Diagnosis not present

## 2020-08-17 DIAGNOSIS — N8110 Cystocele, unspecified: Secondary | ICD-10-CM | POA: Diagnosis not present

## 2020-08-17 DIAGNOSIS — L309 Dermatitis, unspecified: Secondary | ICD-10-CM | POA: Diagnosis not present

## 2020-08-19 DIAGNOSIS — Z1231 Encounter for screening mammogram for malignant neoplasm of breast: Secondary | ICD-10-CM | POA: Diagnosis not present

## 2020-08-22 ENCOUNTER — Ambulatory Visit (INDEPENDENT_AMBULATORY_CARE_PROVIDER_SITE_OTHER): Payer: Medicare HMO | Admitting: Family Medicine

## 2020-08-22 ENCOUNTER — Other Ambulatory Visit: Payer: Self-pay

## 2020-08-22 VITALS — BP 118/82 | Ht 63.0 in | Wt 163.0 lb

## 2020-08-22 DIAGNOSIS — S46819A Strain of other muscles, fascia and tendons at shoulder and upper arm level, unspecified arm, initial encounter: Secondary | ICD-10-CM

## 2020-08-22 DIAGNOSIS — G8929 Other chronic pain: Secondary | ICD-10-CM

## 2020-08-22 DIAGNOSIS — M545 Low back pain, unspecified: Secondary | ICD-10-CM

## 2020-08-22 NOTE — Patient Instructions (Signed)
Restart physical therapy - we will ask them to call you to schedule appointments. Do home exercises on days you don't go to therapy. Continue your medications - you can discuss with Morrie Sheldon your primary care provider about maybe going up on these if necessary. Also talk to her about the cyst in your lower pelvis - the radiologist stated a pelvic ultrasound may be needed to look at it better. Follow up with me in 6 weeks.

## 2020-08-23 ENCOUNTER — Encounter: Payer: Self-pay | Admitting: Family Medicine

## 2020-08-23 NOTE — Progress Notes (Signed)
MRI results reviewed and discussed with patient and interpreter.  Multilevel degenerative changes but only mild narrowing at multiple levels.  Discussed restarting physical therapy, increase in medications (gabapentin, cymbalta), consultation with neurosurgery for possible ESIs.  She will restart physical therapy.  Also noted a cystic lesion in pelvis - advised to follow-up with PCP about this - measures 2.8x1.7cm.  She reports having had hysterectomy and believes had oophorectomy as well.  She verbalized understanding regarding follow up with them.  Follow up with [place] in 6 weeks for her back.

## 2020-08-31 ENCOUNTER — Ambulatory Visit (INDEPENDENT_AMBULATORY_CARE_PROVIDER_SITE_OTHER): Payer: Medicare HMO | Admitting: Podiatry

## 2020-08-31 ENCOUNTER — Other Ambulatory Visit: Payer: Self-pay

## 2020-08-31 ENCOUNTER — Ambulatory Visit (INDEPENDENT_AMBULATORY_CARE_PROVIDER_SITE_OTHER): Payer: Medicare HMO

## 2020-08-31 DIAGNOSIS — M722 Plantar fascial fibromatosis: Secondary | ICD-10-CM

## 2020-08-31 NOTE — Progress Notes (Signed)
   Subjective: 64 y.o. female presenting today for right heel pain is been going on for several years.  Patient has not done anything for treatment.  Although she does take diclofenac 2 times daily for her arthritis.  She presents today for further treatment and evaluation.   Past Medical History:  Diagnosis Date  . Arthritis    Bil hands  . Bladder prolapse, female, acquired   . Breast mass    left  . Chronic headaches   . Coronary artery disease   . Fibromyalgia   . GERD (gastroesophageal reflux disease)   . Glaucoma   . Hyperlipemia   . Hypertension   . Insomnia   . Prediabetes   . Vitamin D deficiency      Objective: Physical Exam General: The patient is alert and oriented x3 in no acute distress.  Dermatology: Skin is warm, dry and supple bilateral lower extremities. Negative for open lesions or macerations bilateral.   Vascular: Dorsalis Pedis and Posterior Tibial pulses palpable bilateral.  Capillary fill time is immediate to all digits.  Neurological: Epicritic and protective threshold intact bilateral.   Musculoskeletal: Tenderness to palpation to the plantar aspect of the right heel along the plantar fascia. All other joints range of motion within normal limits bilateral. Strength 5/5 in all groups bilateral.   Radiographic exam: Normal osseous mineralization. Joint spaces preserved. No fracture/dislocation/boney destruction. No other soft tissue abnormalities or radiopaque foreign bodies.   Assessment: 1. Plantar fasciitis right  Plan of Care:  1. Patient evaluated. Xrays reviewed.   2. Injection of 0.5cc Celestone soluspan injected into the right plantar fascia  3.  Continue diclofenac 75 mg 2 times daily as prescribed by PCP 4.  Patient has a plantar fascial brace that she wears.  Wear daily 5.  Today the patient was wearing dress shoes.  Recommend that she wears good comfortable sneakers for the next month.   6. Instructed patient regarding therapies and  modalities at home to alleviate symptoms.  7. Return to clinic in 4 weeks.    *Originally from [place].  Has lived here for 23 years.   Felecia Shelling, DPM [facility]  Dr. Felecia Shelling, DPM    [address] [place], [place] [address]                Office [phone]  Fax [phone]

## 2020-09-06 ENCOUNTER — Encounter: Payer: Self-pay | Admitting: *Deleted

## 2020-09-07 ENCOUNTER — Telehealth: Payer: Self-pay | Admitting: *Deleted

## 2020-09-07 ENCOUNTER — Encounter: Payer: Self-pay | Admitting: Neurology

## 2020-09-07 ENCOUNTER — Other Ambulatory Visit: Payer: Self-pay | Admitting: Family Medicine

## 2020-09-07 ENCOUNTER — Other Ambulatory Visit: Payer: Self-pay

## 2020-09-07 ENCOUNTER — Ambulatory Visit (INDEPENDENT_AMBULATORY_CARE_PROVIDER_SITE_OTHER): Payer: Medicare HMO | Admitting: Neurology

## 2020-09-07 VITALS — BP 127/87 | HR 88 | Ht 63.0 in | Wt 165.0 lb

## 2020-09-07 DIAGNOSIS — R519 Headache, unspecified: Secondary | ICD-10-CM | POA: Diagnosis not present

## 2020-09-07 DIAGNOSIS — H539 Unspecified visual disturbance: Secondary | ICD-10-CM

## 2020-09-07 DIAGNOSIS — G43709 Chronic migraine without aura, not intractable, without status migrainosus: Secondary | ICD-10-CM | POA: Diagnosis not present

## 2020-09-07 DIAGNOSIS — R29898 Other symptoms and signs involving the musculoskeletal system: Secondary | ICD-10-CM

## 2020-09-07 DIAGNOSIS — R202 Paresthesia of skin: Secondary | ICD-10-CM | POA: Diagnosis not present

## 2020-09-07 DIAGNOSIS — R531 Weakness: Secondary | ICD-10-CM

## 2020-09-07 DIAGNOSIS — R51 Headache with orthostatic component, not elsewhere classified: Secondary | ICD-10-CM | POA: Diagnosis not present

## 2020-09-07 DIAGNOSIS — G44229 Chronic tension-type headache, not intractable: Secondary | ICD-10-CM | POA: Diagnosis not present

## 2020-09-07 DIAGNOSIS — R200 Anesthesia of skin: Secondary | ICD-10-CM | POA: Diagnosis not present

## 2020-09-07 DIAGNOSIS — R270 Ataxia, unspecified: Secondary | ICD-10-CM

## 2020-09-07 DIAGNOSIS — M5416 Radiculopathy, lumbar region: Secondary | ICD-10-CM | POA: Diagnosis not present

## 2020-09-07 MED ORDER — PREGABALIN 75 MG PO CAPS: 75.0000 mg | ORAL_CAPSULE | Freq: Two times a day (BID) | ORAL | 3 refills | Status: DC

## 2020-09-07 MED ORDER — AMITRIPTYLINE HCL 25 MG PO TABS: 25.0000 mg | ORAL_TABLET | Freq: Every day | ORAL | 3 refills | Status: DC

## 2020-09-07 NOTE — Patient Instructions (Addendum)
MRI of the brain: we will call to schedule EMG/NCS of the right hand to evaluate for carpal tunnel syndrome Start Lyrica for pain and headaches Stop Nortriptyline and start Amitriptyline Blood work today  Water quality scientist La electroneuromiografa es un estudio que se realiza para Development worker, community el funcionamiento de los msculos y de los nervios. Este procedimiento incluye el uso combinado de una electromiografa (EMG) y un estudio de conduccin nerviosa (ECN). La EMG se utiliza para Landscape architect presencia de trastornos musculares. El ECN, tambin llamado electroneurografa, determina si los nervios controlan bien los msculos. Generalmente, los procedimientos se realizan juntos para determinar si los msculos y los nervios estn sanos. Si los [employer] pruebas son Nutritional therapist, esto puede indicar una enfermedad o una lesin, como una enfermedad neuromuscular o dao nervioso perifrico. Informe al mdico acerca de lo siguiente:  Cualquier alergia que tenga.  Todos los Walt Disney, incluidos vitaminas, hierbas, gotas oftlmicas, cremas y [address] de [address].  Cualquier problema previo que usted o algn miembro de su familia haya tenido con los anestsicos.  Cualquier trastorno de la sangre que tenga.  Cirugas a las que se haya sometido.  Cualquier afeccin mdica que tenga.  Si tiene Chief Financial Officer.  Si est embarazada o podra estarlo. Cules son los riesgos? En general, se trata de un procedimiento seguro. Sin embargo, pueden ocurrir complicaciones, por ejemplo:  Infecciones donde se insertaron los electrodos.  Sangrado. Qu ocurre antes del procedimiento? Medicamentos Consulte al mdico sobre:  Multimedia programmer o suspender los medicamentos que toma habitualmente. Esto es muy importante si toma medicamentos para la diabetes o anticoagulantes.  Tomar medicamentos como aspirina e ibuprofeno. Estos medicamentos pueden tener un efecto  anticoagulante en la [place]. No tome estos medicamentos a menos que el mdico se lo indique.  Tomar medicamentos de [employer], vitaminas, hierbas y suplementos. Instrucciones generales  El mdico puede pedirle que evite lo siguiente: ? Bebidas que contengan cafena, como caf y t. ? Cualquier producto que contenga nicotina o tabaco. Estos productos incluyen cigarrillos, cigarrillos electrnicos y tabaco para Theatre manager. Si necesita ayuda para dejar de fumar, consulte al mdico.  No use lociones ni cremas el mismo Engineer, manufacturing. Qu ocurre durante el procedimiento? Para la EMG   El mdico le pedir que permanezca en una posicin para poder acceder al [employer] se estudiar. Usted puede estar de pie, sentado o acostado.  Tal vez le administren un medicamento para adormecer la zona (anestesia local).  Se insertar en el msculo una aguja muy delgada que tiene un electrodo.  Se colocar otro electrodo sobre la piel cerca del msculo.  El mdico le pedir que siga permaneciendo quieto.  Los electrodos enviarn una seal que informa sobre la actividad Radio producer de los msculos. Puede verla en un monitor o escucharla en la sala.  Despus de estudiar los msculos en reposo, el mdico le pedir que los contraiga o los flexione. Los electrodos enviarn una seal que informa sobre la actividad Radio producer de los msculos.  El mdico retirar los electrodos y las agujas con electrodos una vez finalizado el procedimiento. El procedimiento puede variar segn el mdico y el hospital. Para el estudio de conduccin nerviosa   Se colocar sobre la piel, junto al msculo que se estudia, un electrodo que registra la actividad de los nervios (electrodo de Engineer, maintenance (IT)).  Cerca del electrodo de Engineer, maintenance (IT), se colocar un electrodo que se [place] como referencia (electrodo de Mining engineer).  Se aplicar una pasta o un gel  sobre la piel entre el electrodo de registro y el de  Mining engineer.  Se estimular el nervio con un choque suEngineer, maintenance (IT)El mdico medir cunto tiempo le lleva al [employer].  El mdico retirar los electrodos y el gel una vez finalizado el procedimiento. El procedimiento puede variar segn el mdico y el hospital. [place] ocurre despus del procedimiento?  Es su responsabilidad retirar [employer] Corporation del procedimiento. Pregntele a su mdico, o a un miembro del personal del departamento donde se realice el procedimiento, cundo estarn [employer].  El mdico tambin podr hacer lo siguiente: ? Administrarle analgsicos. ? Controlar los lugares de insercin para asegurarse de que el sangrado se [place]. Resumen  La electroneuromiografa es un estudio que se realiza para evaluar el funcionamiento de los msculos y de los nervios.  Si los [employer] pruebas son Nutritional therapist, esto puede indicar una enfermedad o una lesin.  Es un procedimiento Librarian, academic. Sin embargo, pueden ocurrir problemas, como sangrado e infeccin.  El mdico [employer] pruebas para completar este procedimiento. Una de ellas verifica los msculos (EMG) y la otra verifica los nervios (ECN).  Es su responsabilidad retirar [employer] Corporation del procedimiento. Pregntele a su mdico, o a un miembro del personal del departamento donde se realice el procedimiento, cundo estarn [employer]. Esta informacin no tiene Theme park manager el consejo del mdico. Asegrese de hacerle al mdico cualquier pregunta que tenga. Document Revised: 07/04/2018 Document Reviewed: 07/04/2018 Elsevier Patient Education  2020 Elsevier Inc.   Pregabalin capsules [place]ads es este medicamento? La PREGABALINA se [place] para tratar [employer] de los nervios provocado por diabetes, culebrilla, dao a la mdula espinal y fibromialgia. Se utiliza tambin para controlar las convulsiones de la epilepsia. Este medicamento puede ser utilizado para otros usos; si tiene alguna pregunta  consulte con su proveedor de atencin mdica o con su farmacutico. MARCAS COMUNES: Lyrica Qu le debo informar a mi profesional de la salud antes de tomar este medicamento? Necesitan saber si usted presenta alguno de los siguientes problemas o situaciones: enfermedad cardiaca antecedentes de abuso de alcohol o drogas enfermedad renal enfermedad pulmonar o respiratoria ideas, planes o intento de suicidio; si usted o alguien de su familia ha intentado suicidarse previamente una reaccin alrgica o inusual a la pregabalina, a la gabapentina, a otros medicamentos, alimentos, colorantes o conservantes si est embarazada o buscando quedar embarazada si est amamantando a un beb Cmo debo utilizar este medicamento? Tome este medicamento por va oral con un vaso de agua. Siga las instrucciones de la etiqueta del [place]. Puede tomarlo con o sin alimentos. Si el Social worker, tmelo con alimentos. Use su medicamento a intervalos regulares. No lo use con una frecuencia mayor a la indicada. No deje de usarlo, excepto si as lo indica su mdico. Su farmacutico le dar una Gua del medicamento especial (MedGuide, nombre en ingls) con cada receta y en cada ocasin que la vuelva a surtir. Asegrese de leer esta informacin cada vez cuidadosamente. Hable con su pediatra para informarse acerca del uso de este medicamento en nios. Aunque este medicamento se puede recetar a nios tan pequeos como de 1 mes de edad con ciertas afecciones, existen precauciones que deben tomarse. Sobredosis: Pngase en contacto inmediatamente con un centro toxicolgico o una sala de urgencia si usted cree que haya tomado demasiado medicamento. ATENCIN: [employer] es solo para usted. No comparta este medicamento con nadie. Qu sucede si me olvido de una dosis? Si olvida una dosis,  tmela lo antes posible. Si es casi la hora de la prxima dosis, tome slo esa dosis. No tome dosis adicionales o  dobles. Qu puede interactuar con este medicamento? Este medicamento podra interactuar con los siguientes frmacos: alcohol antihistamnicos para Programmer, multimedia, tos y resfro ciertos medicamentos para la ansiedad o para dormir ciertos medicamentos para la depresin, tales como amitriptilina, fluoxetina, sertralina ciertos medicamentos para la diabetes ciertos medicamentos para convulsiones, tales como fenobarbital, primidona anestsicos generales, tales como halotano, isoflurano, metoxiflurano, propofol anestsicos locales, tales como lidocana, pramoxina, tetracana medicamentos para relajar los msculos antes de una ciruga medicamentos narcticos para Chief Technology Officer fenotiazinas, tales como clorpromazina, Manufacturing engineer, Paediatric nurse, tioridazina Puede ser que esta lista no menciona todas las posibles interacciones. Informe a su profesional de [employer] de [employer] Inc productos a base de hierbas, medicamentos de [place] o suplementos nutritivos que est tomando. Si usted fuma, consume bebidas alcohlicas o si utiliza drogas ilegales, indqueselo tambin a su profesional de [employer]. Algunas sustancias pueden interactuar con su medicamento. A qu debo estar atento al usar [employer] Corporation? Informe a su mdico o su profesional de la salud si sus sntomas no comienzan a mejorar o si empeoran. Visite a su mdico o a su profesional de la salud para chequear su evolucin peridicamente. No deje de tomar este medicamento excepto si as lo indica su mdico. Usted puede desarrollar una reaccin grave. Su mdico le indicar la cantidad de Agricultural consultant. Use una pulsera o cadena de identificacin mdica. Lleve consigo una tarjeta de identificacin con informacin sobre su enfermedad y Engineer, manufacturing systems de sus medicamentos y los horarios de las dosis. Puede experimentar mareos o somnolencia. No conduzca ni utilice maquinaria ni haga nada que Scientist, research (life sciences) en estado de alerta hasta que sepa cmo le afecta  este medicamento. No se siente ni se ponga de pie con rapidez, especialmente si es un paciente de edad avanzada. Esto reduce el riesgo de mareos o [employer]. El alcohol puede interferir con el efecto de [place]. Evite consumir bebidas alcohlicas. Si tiene Administrator, arts, como insuficiencia cardaca [place], y nota que est reteniendo agua y tiene hinchazn de las manos o pies, consulte a su proveedor de atencin mdica inmediatamente. El uso de este medicamento puede aumentar la posibilidad de ideas o comportamiento suicida. Presta atencin a como usted responde mientras est usando este medicamento. Cualquier empeoramiento de humor o ideas de suicidio o de morir que [place], debe informar a su profesional de la salud inmediatamente. Este medicamento ha causado una reduccin de esperma en algunos hombres. Esto puede interferir con la capacidad de tener un nio. Usted debe hablar con su mdico su profesional de la salud si est preocupado sobre su fertilidad. Las mujeres que se encuentran Database administrator usan este medicamento para convulsiones pueden inscribirse en el registro del [place] American Antiepileptic Drug Pregnancy Registry (Registro estadounidense de Psychiatrist de Medicamentos Antiepilpticos) llamando al telfono [phone]. Este registro recoge informacin acerca de la seguridad del uso de medicamentos antiepilpticos durante el Psychiatrist. Qu efectos secundarios puedo tener al [employer] este medicamento? Efectos secundarios que debe informar a su mdico o a Producer, television/film/video de la salud tan pronto como sea posible: Therapist, art, como erupcin cutnea, picazn o urticarias, e hinchazn de la cara, los labios o la lengua problemas respiratorios cambios en la visin dolor en el pecho confusin sacudidas o movimientos inusuales de cualquier parte del cuerpo prdida de Engineer, maintenance, sensibilidad o debilidad muscular ideas suicidas u otros cambios en el [place] de  nimo  hinchazn de tobillos, pies, manos sangrado o moretones inusuales Efectos secundarios que generalmente no requieren atencin mdica (infrmelos a su mdico o a Producer, television/film/video de la salud si persisten o si son molestos): mareos somnolencia boca seca dolor de cabeza nuseas temblores dificultad para conciliar el sueo aumento de peso Puede ser que esta lista no menciona todos los posibles efectos secundarios. Comunquese a su mdico por asesoramiento mdico [employer]lett[employer]. Usted puede informar los efectos secundarios a la FDA por telfono al 1-800-FDA-1088. Dnde debo guardar mi medicina? Mantenga fuera del alcance de los nios. Existe la posibilidad de abusar de [employer] Corporation. Mantenga su medicamento en un lugar seguro para protegerlo contra robos. No comparta este medicamento con nadie. Es peligroso vender o Restaurant manager, fast food, y est prohibido por la ley. Este medicamento puede causar [place] sobredosis accidental y la muerte si lo toman otros adultos, nios o Neurosurgeon. Mezcle todo el medicamento sin usar con una sustancia como piedras sanitarias para gatos o posos (residuos) de caf. Luego deseche el [employer] un recipiente cerrado, como una bolsa sellada o una lata de caf con tapa. No utilice este medicamento despus de la fecha de vencimiento. Guarde a [employer]I, entre 15 y 30 grados Celsius (59 y 62 grados Fahrenheit). ATENCIN: Este folleto es un resumen. Puede ser que no cubra toda la posible informacin. Si usted tiene preguntas acerca de esta medicina, consulte con su mdico, su farmacutico o su profesional de Radiographer, therapeutic.  2020 Elsevier/Gold Standard (2018-12-02 00:00:00)   Amitriptyline tablets  Qu le debo informar a mi profesional de la salud antes de tomar este medicamento? Necesita saber si usted presenta alguno de los siguientes problemas o situaciones:  problemas de alcoholismo  asma, dificultad al respirar  trastorno bipolar o  esquizofrenia  dificultad para orinar, problema de prstata  glaucoma  enfermedad cardiaca o ataque cardiaco previo  enfermedad heptica  hipertiroidismo  convulsiones  ideas o planes suicidos, intentos de suicidio previos o antecendentes familiares de intentos de suicidio  una reaccin alrgica o inusual a la amitriptilina, otros medicamentos, alimentos, colorantes o conservadores  si est embarazada o buscando quedar embarazada  si est amamantando a un beb Cmo debo utilizar este medicamento? [employer] medicamento por va oral con un poco de [place]. Siga las instrucciones de la etiqueta del [place]. Puede tomar las tabletas con o sin alimentos. Tome su medicamento a intervalos regulares. No lo tome con una frecuencia mayor a la indicada. No deje de tomar PPL Corporation de repente a menos que as lo indique su mdico. Dejar de Visual merchandiser medicamento demasiado rpido puede causar efectos secundarios graves o podra empeorar su afeccin. Su farmacutico le dar una Gua del medicamento especial (MedGuide, nombre en ingls) con cada receta y en cada ocasin que la vuelva a surtir. Asegrese de leer esta informacin cada vez cuidadosamente. Hable con su pediatra para informarse acerca del uso de este medicamento en nios. Puede requerir atencin especial. Sobredosis: Pngase en contacto inmediatamente con un centro toxicolgico o una sala de urgencia si usted cree que haya tomado demasiado medicamento. ATENCIN: [employer] es solo para usted. No comparta este medicamento con nadie. Qu sucede si me olvido de una dosis? Si olvida una dosis, tmela lo antes posible. Si es casi la hora de la prxima dosis, tome slo esa dosis. No tome dosis adicionales o dobles. Qu puede interactuar con este medicamento? No use este medicamento con ninguno de los siguientes frmacos: trixido de arsnico ciertos medicamentos  usados para regular la frecuencia cardiaca anormal o tratar otras  afecciones cardiacas cisaprida droperidol halofantrina linezolida IMAO, tales como Carbex, Eldepryl, Marplan, [place] y Parnate azul de metileno otros medicamentos para la depresin mental fenotiazinas, tales como perfenacina, tioridazina y Barista pimozida probucol procarbazina esparfloxacino hierba de [place] medicamento tambin puede Product/process development scientist con los siguientes frmacos: atropina y frmacos relacionados, tales como hiosciamina, escopolamina, tolterodina y otros barbitricos para inducir el sueo o para el tratamiento de convulsiones, tal como el fenobarbital cimetidina disulfirm etclorvinol hormonas tiroideas, como levotiroxina ziprasidona Puede ser que esta lista no menciona todas las posibles interacciones. Informe a su profesional de [employer] de [employer] Inc productos a base de hierbas, medicamentos de [place] o suplementos nutritivos que est tomando. Si usted fuma, consume bebidas alcohlicas o si utiliza drogas ilegales, indqueselo tambin a su profesional de [employer]. Algunas sustancias pueden interactuar con su medicamento. A qu debo estar atento al usar [employer]? Informe a su mdico si sus sntomas no mejoran o si empeoran. Visite a su mdico o a su profesional de la salud para chequear su evolucin peridicamente. Debido que puede ser necesario tomar este medicamento durante varias semanas para que sea posible observar sus efectos en forma [place], es importante que sigue su tratamiento como recetado por su mdico. Los pacientes y sus familias deben estar atentos si empeora la depresin o ideas suicidas. Tambin est atento a cambios repentinos o severos de emocin, tales como el sentirse ansioso, agitado, lleno de pnico, irritable, hostil, agresivo, impulsivo, inquietud severa, demasiado excitado y hiperactivo o dificultad para conciliar el sueo. Si esto ocurre, especialmente al comenzar con un tratamiento antidepresivo o al cambiar de dosis, comunquese con su  profesional de [employer]. Puede experimentar mareos o somnolencia. No conduzca ni utilice maquinaria ni haga nada que Scientist, research (life sciences) en estado de alerta hasta que sepa cmo le afecta este medicamento. No se siente ni se ponga de pie con rapidez, especialmente si es un paciente de edad avanzada. Esto reduce el riesgo de mareos o [employer]. El alcohol puede interferir con el efecto de [place]. Evite consumir bebidas alcohlicas. No se trate usted mismo si tiene tos, resfro o Environmental consultant sin Science writer con su mdico o con su profesional de [employer]. Algunos ingredientes pueden aumentar los posibles efectos secundarios. Se le podr secar la boca. Masticar chicle sin azcar, chupar caramelos duros y tomar agua en abundancia le ayudar a mantener la boca hmeda. Si el problema no desaparece o es severo, consulte a su mdico. Este medicamento puede resecarle los ojos y provocar visin borrosa. Si [place] lentes de contacto, puede sentir ciertas molestias. Las gotas lubricantes pueden ser tiles. Si el problema no desaparece o es severo, consulte con su mdico de los ojos. Este medicamento causar estreimiento. Trate de evacuar los intestinos al menos cada 2  3 das. Si no evacua los intestinos durante [address]  [address], comunquese con su mdico o con su profesional de [employer]. Este medicamento puede aumentar la sensibilidad al sol. Mantngase fuera de Secretary/administrator. Si no lo puede evitar, utilice ropa protectora y crema de Orthoptist. No utilice lmparas solares, camas solares ni cabinas solares. Qu efectos secundarios puedo tener al [employer] este medicamento? Efectos secundarios que debe informar a su mdico o a Producer, television/film/video de la salud tan pronto como sea posible: Therapist, art, como erupcin cutnea, comezn/picazn o urticarias, e hinchazn de la cara, los labios o la lengua ansiedad problemas respiratorios cambios en la visin confusin  estado de nimo elevado, menor necesidad de dormir,  pensamientos acelerados, conducta impulsiva dolor ocular ritmo cardiaco rpido, irregular sensacin de desmayos o aturdimiento, cadas sensacin de agitacin, enojo o irritabilidad fiebre con aumento de la sudoracin alucinaciones, prdida del contacto con la realidad convulsiones rigidez de los msculos ideas suicidas u otros cambios en el estado de nimo hormigueo, Engineer, miningdolor o entumecimiento de los pies o las manos dificultad para Geographical information systems officerorinar o cambios en el volumen de orina dificultad para conciliar el sueo cansancio o debilidad inusual vmito color amarillento de los ojos o la piel Efectos secundarios que generalmente no requieren atencin mdica (infrmelos a su mdico o a Producer, television/film/videosu profesional de la salud si persisten o si son molestos): cambios en el deseo o desempeo sexual cambios en el apetito o el peso estreimiento mareos boca seca nuseas cansancio temblores Programme researcher, broadcasting/film/videomalestar estomacal Puede ser que esta lista no menciona todos los posibles efectos secundarios. Comunquese a su mdico por asesoramiento mdico Hewlett-Packardsobre los efectos secundarios. Usted puede informar los efectos secundarios a la FDA por telfono al 1-800-FDA-1088. Dnde debo guardar mi medicina? Mantngala fuera del alcance de los nios. Gurdela a Sanmina-SCItemperatura ambiente, entre 20 y 25 grados C ([phone] y 7977 grados F). Deseche todo el medicamento que no haya utilizado, despus de su fecha de vencimiento. ATENCIN: Este folleto es un resumen. Puede ser que no cubra toda la posible informacin. Si usted tiene preguntas acerca de esta medicina, consulte con su mdico, su farmacutico o su profesional de Radiographer, therapeuticla salud.  2020 Elsevier/Gold Standard (2019-05-26 00:00:00)

## 2020-09-07 NOTE — Telephone Encounter (Signed)
Pregabalin 75 mg PA completed on Cover My Meds. Awaiting determination from [employer] Part D.

## 2020-09-07 NOTE — Telephone Encounter (Signed)
Per Cover My Meds, Pregabalin has been approved from 01/14/2020 - 10/14/2020. Notified pharmacy via fax. Received a receipt of confirmation.

## 2020-09-07 NOTE — Telephone Encounter (Signed)
Per [employer], Amitriptyline hcl tablet approved. This approval authorizes your coverage from 01/14/2020 - 10/14/2020.  Update pharmacy via fax. Received a receipt of confirmation.

## 2020-09-07 NOTE — Progress Notes (Signed)
[facility]    Provider:  Dr Lucia GaskinsAhern Referring Provider:  Norm SaltVanstory, Ashley N, PA Primary Care Physician:  Norm SaltVanstory, Ashley N, PA  CC:  Dizziness and Headache, imbalance, paresthesias  HPI: This is a 64 year old patient [facility] from [facility] for muscle weakness. She has been seen in our practice several times for other issues:  She was seen in 2016 here in our office and diagnosed with chronic tension type headache. She was also seen in 2020 and we tried to help her get [facility] finsncial assistance at the time in order to get her imaging and treat her headaches. Today she is coming back for new symptoms of muscle weakness, paresthesias and imbalance.  I reviewed Johnney Oushley Vanstory's notes: Patient has pain on the right side of her body, it feels like electricity shooting through her body, pain is still present, she has a history of hypertension, fibromyalgia GERD and headaches as above, it appears as though she is currently on nortriptyline, amlodipine and gabapentin and metoprolol which are all medications that can be used for headache/migraine management.  She reported muscle weakness, she was started on tizanidine for fibromyalgia, she was started on prednisone for pain in her joints at multiple sites.  She has pounding in her head, still having migraines,morning headaches, positional headaches, vision changes- they have not changed since last being seen, she has them at least half the month. She has insomnia she can;t sleep. She has pain on the right side of her body. She is seeing another physician for the low back pain. I have advised her to see her primary care for the cyst on her ovaries. She has pain in her buttocks a nerve and it hurts on the right side and was throbbing. We discussed injections as well into the low back, the MRI does not look like she needs surgery and we discussed that, she endorses weakness. Her right arm feels numb and when she grabs something she feels like  she has weakness in the right hand. It wakes her up in the middle of the night(the right hand tingly and numb). Needles in the low back. No falls. She endorses right arm and right leg weakness. No inciting events or illnesses or new medications. Nothing helps much and the symptoms are continuous and can be moderately to severely painful. No other focal neurologic deficits, associated symptoms, inciting events or modifiable factors.  MRI lumbar spine: IMPRESSION: Inferiorly migrated left L1-2 paracentral and L2-3 central extrusions. Mild L1-3 spinal canal and bilateral L2-3 neural foraminal narrowing. Personally reviewedimages.   Mild spinal canal and neural foraminal narrowing at the L4-5 level.  Mild right L5-S1 neural foraminal narrowing.  2.8 cm right hemipelvic cystic lesion is partially imaged and may involve the right adnexa. Consider dedicated pelvic ultrasound for further evaluation.   PRIOR  HPI 2020: This is a 64 year old patient here from Dr. Dione BoozeGroat and Loletta SpecterGomez, Roger David, PA-C for visual disturbance and non intractable headache.  She was last seen in 2016 here in our office and diagnosed with chronic tension type headache.  She has a past medical history of chronic headaches, coronary artery disease, hypertension,.  Recently evaluated for temporal arteritis due to vision changes which was negative biopsy.  She also has a past medical history of fibromyalgia, arthritis, glaucoma. Patient the biopsy was negative.  She was sent to ophthalmology.  The headache in on the top of her head. Laying own and closing eyes and turning off the lights help.  It is pounding. The light bothers her. Movement makes it worse. Nausea. She feels dizzy and lightheaded. She feels she is going to faint.  She denies any vision loss and she says her vision is much better since she got new glasses no more vision complaints since getting the new glasses. She has side effects to the Topamax. She is stil having  insomnia. She has the migraines 3-4 days a week. Mo medication overuse.   Meds tried: Topamax, cymbalta, gabapentin, losartan, zofran, compazine, elavil, lexapro,   CT head 2013 showed No acute intracranial abnormalities including mass lesion or mass effect, hydrocephalus, extra-axial fluid collection, midline shift, hemorrhage, or acute infarction, large ischemic events (personally reviewed images)   I reviewed the charts patient was seen by vascular surgeon Dr. Myra Gianotti in December 2019 for evaluation of temporal arteritis.  She has a long history of pain in her right scalp as well as pain that goes down her right arm.  A month prior she started having short episodes of visual loss lasting about 3 seconds.  Temporal artery biopsy was requested.  She does have a history of coronary artery disease. Patient the biopsy was negative.  She was sent to ophthalmology.    CMP with elevated glucose  HPI 08/15/2015 :  Ulanda Tackett is a 64 y.o. female here as a referral from Dr. Iona Coach dizziness and headache. Past medical history of hypertension, anxiety, fibromyalgia. She is non-english speaking and is here with an interpreter. Symptoms started 2 months ago. No inciting factors, no trauma, no new medication. She has been having a lot of stress, she has depression after losing her son at the age of 21 in an accident. She has been thinking of that recently. She is teary in the office. She has had headaches for a long time. She has been having headaches for 14 years. She has a history of depression. Then the headaches started. The headaches are all over th ehead, pressure, throbbing, on both sides of the head. No light sensitivity or sound sensitivity. She has sensitivity in the scalp, she feels a burning sensation on the top of her head. No nausea or vomiting. The headaches are 4 days a week. She takes alleve for the headaches, not every day. No vision changes, no speech changes, no hearing changes. The pain  can be severe, worsening. She gets blurry vision and dizziness with the headaches. Symptoms have been gong on for 12 years but they are worsening recently. Amitriptyline in the past  helped her a lot. No cardiac problems. She has insomnia.  Ct of the head 07/2012 showed No acute intracranial abnormalities including mass lesion or mass effect, hydrocephalus, extra-axial fluid collection, midline shift, hemorrhage, or acute infarction, large ischemic events (personally reviewed images)   Review of Systems: Patient complains of symptoms per HPI as well as the following symptoms:Ringing in ears, light sensitivity, blurred vision, dizziness, headache, weakness, decreased concentration, joint pain, back pain, neck pain . Pertinent negatives per HPI. All others negative   Social History   Socioeconomic History  . Marital status: Legally Separated    Spouse name: Maisie Fus  . Number of children: 2  . Years of education: 27  . Highest education level: Not on file  Occupational History  . Occupation: Retired  Tobacco Use  . Smoking status: Never Smoker  . Smokeless tobacco: Never Used  Vaping Use  . Vaping Use: Never used  Substance and Sexual Activity  . Alcohol use: Not Currently    Comment:  SOCIAL  . Drug use: No  . Sexual activity: Yes    Birth control/protection: Surgical  Other Topics Concern  . Not on file  Social History Narrative   Lives at home alone   Caffeine use: 1 cup coffee in AM, sometimes 1 decaf cup in afternoon. Green tea.    Ambidextrous   Social Determinants of Health   Financial Resource Strain:   . Difficulty of Paying Living Expenses: Not on file  Food Insecurity:   . Worried About Programme researcher, broadcasting/film/video in the Last Year: Not on file  . Ran Out of Food in the Last Year: Not on file  Transportation Needs:   . Lack of Transportation (Medical): Not on file  . Lack of Transportation (Non-Medical): Not on file  Physical Activity:   . Days of Exercise per Week:  Not on file  . Minutes of Exercise per Session: Not on file  Stress:   . Feeling of Stress : Not on file  Social Connections:   . Frequency of Communication with Friends and Family: Not on file  . Frequency of Social Gatherings with Friends and Family: Not on file  . Attends Religious Services: Not on file  . Active Member of Clubs or Organizations: Not on file  . Attends Banker Meetings: Not on file  . Marital Status: Not on file  Intimate Partner Violence:   . Fear of Current or Ex-Partner: Not on file  . Emotionally Abused: Not on file  . Physically Abused: Not on file  . Sexually Abused: Not on file    Family History  Problem Relation Age of Onset  . Hypertension Mother   . Hypertension Father   . Migraines Neg Hx     Past Medical History:  Diagnosis Date  . Arthritis    Bil hands  . Bladder prolapse, female, acquired   . Breast mass    left  . Chronic headaches   . Coronary artery disease   . Fibromyalgia   . GERD (gastroesophageal reflux disease)   . Glaucoma   . Hyperlipemia   . Hypertension   . Imbalance   . Insomnia   . Osteoporosis   . Paresthesia   . Prediabetes   . Vitamin D deficiency   . Vitamin D deficiency     Past Surgical History:  Procedure Laterality Date  . ABDOMINAL HYSTERECTOMY  2008  . APPENDECTOMY    . ARTERY BIOPSY Right 09/16/2018   Procedure: BIOPSY TEMPORAL ARTERY RIGHT;  Surgeon: Maeola Harman, MD;  Location: [facility] OR;  Service: Vascular;  Laterality: Right;  . BREAST BIOPSY     benign/left breast  . COLONOSCOPY  07/2018  . VAGINAL PROLAPSE REPAIR N/A 12/22/2019   Procedure: RECTOCELE REPAIR WITH GRAFT AND VAGINAL VAULT SUSPENSION/CYSTOSCOPY;  Surgeon: Alfredo Martinez, MD;  Location: [facility] ORS;  Service: Urology;  Laterality: N/A;    Current Outpatient Medications  Medication Sig Dispense Refill  . alendronate (FOSAMAX) 35 MG tablet Take 1 tablet (35 mg total) by mouth every 7 (seven) days. Take with a  full glass of water on an empty stomach. 4 tablet 11  . atorvastatin (LIPITOR) 20 MG tablet TAKE 1 TABLET BY MOUTH EVERY DAY 90 tablet 0  . baclofen (LIORESAL) 10 MG tablet Take 1 tablet (10 mg total) by mouth 3 (three) times daily as needed for muscle spasms. 60 each 1  . Cholecalciferol (VITAMIN D3 PO) Take by mouth daily.    . Cyanocobalamin (VITAMIN B-12  PO) Take by mouth daily.    . diclofenac (VOLTAREN) 75 MG EC tablet Take 1 tablet (75 mg total) by mouth 2 (two) times daily. 60 tablet 1  . losartan-hydrochlorothiazide (HYZAAR) 50-12.5 MG tablet Take 1 tablet by mouth daily.    . metoprolol succinate (TOPROL-XL) 25 MG 24 hr tablet Take 25 mg by mouth daily.    Marland Kitchen omeprazole (PRILOSEC) 20 MG capsule TAKE 1 CAPSULE BY MOUTH EVERY DAY. Please make PCP appointment. 30 capsule 0  . VITAMIN E PO Take by mouth daily.    Marland Kitchen amitriptyline (ELAVIL) 25 MG tablet Take 1 tablet (25 mg total) by mouth at bedtime. 90 tablet 3  . pregabalin (LYRICA) 75 MG capsule Take 1 capsule (75 mg total) by mouth 2 (two) times daily. 60 capsule 3   Current Facility-Administered Medications  Medication Dose Route Frequency Provider Last Rate Last Admin  . 0.9 %  sodium chloride infusion  500 mL Intravenous Once Meryl Dare, MD        Allergies as of 09/07/2020  . (No Known Allergies)    Vitals: BP 127/87 (BP Location: Right Arm, Patient Position: Sitting)   Pulse 88   Ht 5\' 3"  (1.6 m)   Wt 165 lb (74.8 kg)   BMI 29.23 kg/m  Last Weight:  Wt Readings from Last 1 Encounters:  09/07/20 165 lb (74.8 kg)   Last Height:   Ht Readings from Last 1 Encounters:  09/07/20 5\' 3"  (1.6 m)   Physical exam: Exam: Gen: NAD, conversant, well nourised, overeight, well groomed                     CV: RRR, no MRG. No Carotid Bruits. No peripheral edema, warm, nontender Eyes: Conjunctivae clear without exudates or hemorrhage  Neuro: Detailed Neurologic Exam  Speech:    Speech is normal; fluent and spontaneous  with normal comprehension.  Cognition:    The patient is oriented to person, place, and time;     recent and remote memory intact;     language fluent;     normal attention, concentration,     fund of knowledge Cranial Nerves:    The pupils are equal, round, and reactive to light. The fundi are flat. Visual fields are full to finger confrontation. Extraocular movements are intact. Trigeminal sensation is intact and the muscles of mastication are normal. The face is symmetric. The palate elevates in the midline. Hearing intact. Voice is normal. Shoulder shrug is normal. The tongue has normal motion without fasciculations.   Coordination:  no dysmetria or ataxiq  Gait:    antalgic  Motor Observation:    No asymmetry, no atrophy, and no involuntary movements noted. Tone:    Normal muscle tone.    Posture:    Posture is normal. normal erect    Strength: Very poor effort, limited by pain, but no focal weakness noted         Sensation: intact to LT     Reflex Exam:  DTR's:    Deep tendon reflexes in the upper and lower extremities are symmetrical bilaterally.   Toes:    The toes are downgoing bilaterally.   Clonus:    Clonus is absent.  Assessment/Plan:  This is a 65 year old patient here from [facility] for muscle weakness. She has been seen in our practice several times in the past:  She was seen in 2016 here in our office and diagnosed with chronic tension type headache.  She was also seen in 2020 and we tried to help her get [facility] finsncial assistance at the time in order to get her imaging and treat her headaches. Today she is coming back for new symptoms of muscle weakness, paresthesias and imbalance. Neurologic exam was non focal, poor effort on strength testing and some exaggerated responses due to pain per patient.   -Start Lyrica for right-sided lumbar radiculopathy and Lyrica may also help with migraines/tension-type headaches and other somatic complaints she has.  Continue to see the other physician for this, she states she already sees a physician for her low back pain and radiculopathy.  -MRI of the brain: MRI brain due to concerning symptoms of morning headaches, positional headaches,vision changes, imbalance, weakness, paresthesias  to look for space occupying mass, chiari or intracranial hypertension (pseudotumor), strokes, demyelinating disease, or other.  - Change nortriptyline to Amitriptyline 25mg  for pain and tension headaches/, migraines and this may also help with insomnia  -Emg/ncs right arm  - patient has multiple somatic and other complaints, some exaggerated pain response, recommend primary care may explore any underlying issues such as depression or anxiety  Orders Placed This Encounter  Procedures  . MR BRAIN W WO CONTRAST  . TSH  . CBC  . Comprehensive metabolic panel   Meds ordered this encounter  Medications  . amitriptyline (ELAVIL) 25 MG tablet    Sig: Take 1 tablet (25 mg total) by mouth at bedtime.    Dispense:  90 tablet    Refill:  3  . pregabalin (LYRICA) 75 MG capsule    Sig: Take 1 capsule (75 mg total) by mouth 2 (two) times daily.    Dispense:  60 capsule    Refill:  3     PRIOR: Declined MRI of the brain and MRA of the head she is uninsured. Will help her get [facility] Financial Assistance so she can see [facility] regularly and get imaging of the brain and possibly try the new CGRP injection if we can get [facility] to cover her meds as well Start Nortriptyline for headaches Nortriptyline may help with insomnia as well Acute management: Sumatriptan  Discussed: To prevent or relieve headaches, try the following: Cool Compress. Lie down and place a cool compress on your head.  Avoid headache triggers. If certain foods or odors seem to have triggered your migraines in the past, avoid them. A headache diary might help you identify triggers.  Include physical activity in your daily routine. Try a daily walk or other moderate  aerobic exercise.  Manage stress. Find healthy ways to cope with the stressors, such as delegating tasks on your to-do list.  Practice relaxation techniques. Try deep breathing, yoga, massage and visualization.  Eat regularly. Eating regularly scheduled meals and maintaining a healthy diet might help prevent headaches. Also, drink plenty of fluids.  Follow a regular sleep schedule. Sleep deprivation might contribute to headaches Consider biofeedback. With this mind-body technique, you learn to control certain bodily functions -- such as muscle tension, heart rate and blood pressure -- to prevent headaches or reduce headache pain.    Proceed to emergency room if you experience new or worsening symptoms or symptoms do not resolve, if you have new neurologic symptoms or if headache is severe, or for any concerning symptom.   Provided education and documentation from American headache Society toolbox including articles on: chronic migraine medication overuse headache, chronic migraines, prevention of migraines, behavioral and other nonpharmacologic treatments for headache.  Cc: [place], MD, Sallye Lat,  Maura Crandall, PA-C   Naomie Dean, MD  [facility] [address] [place], [place] [address]  Phone [phone] Fax [phone]

## 2020-09-07 NOTE — Telephone Encounter (Signed)
Completed Amitriptyline PA on Cover My Meds. Key: Jeannett Senior. Awaiting determination from [employer] Part D.

## 2020-09-08 LAB — COMPREHENSIVE METABOLIC PANEL
ALT: 27 IU/L (ref 0–32)
AST: 20 IU/L (ref 0–40)
Albumin/Globulin Ratio: 1.5 (ref 1.2–2.2)
Albumin: 4.4 g/dL (ref 3.8–4.8)
Alkaline Phosphatase: 120 IU/L (ref 44–121)
BUN/Creatinine Ratio: 24 (ref 12–28)
BUN: 20 mg/dL (ref 8–27)
Bilirubin Total: 0.3 mg/dL (ref 0.0–1.2)
CO2: 24 mmol/L (ref 20–29)
Calcium: 9.2 mg/dL (ref 8.7–10.3)
Chloride: 99 mmol/L (ref 96–106)
Creatinine, Ser: 0.82 mg/dL (ref 0.57–1.00)
GFR calc Af Amer: 86 mL/min/{1.73_m2} (ref >59)
GFR calc non Af Amer: 75 mL/min/{1.73_m2} (ref >59)
Globulin, Total: 2.9 g/dL (ref 1.5–4.5)
Glucose: 86 mg/dL (ref 65–99)
Potassium: 4.1 mmol/L (ref 3.5–5.2)
Sodium: 139 mmol/L (ref 134–144)
Total Protein: 7.3 g/dL (ref 6.0–8.5)

## 2020-09-08 LAB — CBC
Hematocrit: 41 % (ref 34.0–46.6)
Hemoglobin: 13.4 g/dL (ref 11.1–15.9)
MCH: 30.9 pg (ref 26.6–33.0)
MCHC: 32.7 g/dL (ref 31.5–35.7)
MCV: 95 fL (ref 79–97)
Platelets: 299 10*3/uL (ref 150–450)
RBC: 4.33 x10E6/uL (ref 3.77–5.28)
RDW: 13.4 % (ref 11.7–15.4)
WBC: 6.8 10*3/uL (ref 3.4–10.8)

## 2020-09-08 LAB — TSH: TSH: 2.26 u[IU]/mL (ref 0.450–4.500)

## 2020-09-12 ENCOUNTER — Telehealth: Payer: Self-pay | Admitting: Neurology

## 2020-09-12 ENCOUNTER — Other Ambulatory Visit: Payer: Self-pay | Admitting: Neurology

## 2020-09-12 NOTE — Telephone Encounter (Signed)
[employer] medicare/medicaid order sent to GI. They will reach out to the patient to scheduled.

## 2020-09-13 ENCOUNTER — Ambulatory Visit: Payer: Medicare HMO | Admitting: Physical Therapy

## 2020-09-15 ENCOUNTER — Other Ambulatory Visit (INDEPENDENT_AMBULATORY_CARE_PROVIDER_SITE_OTHER): Payer: Self-pay | Admitting: Primary Care

## 2020-09-15 DIAGNOSIS — E785 Hyperlipidemia, unspecified: Secondary | ICD-10-CM

## 2020-09-15 DIAGNOSIS — Z6841 Body Mass Index (BMI) 40.0 and over, adult: Secondary | ICD-10-CM | POA: Diagnosis not present

## 2020-09-15 DIAGNOSIS — M62838 Other muscle spasm: Secondary | ICD-10-CM | POA: Diagnosis not present

## 2020-09-15 DIAGNOSIS — M797 Fibromyalgia: Secondary | ICD-10-CM | POA: Diagnosis not present

## 2020-09-15 DIAGNOSIS — M5416 Radiculopathy, lumbar region: Secondary | ICD-10-CM | POA: Diagnosis not present

## 2020-09-15 DIAGNOSIS — M2550 Pain in unspecified joint: Secondary | ICD-10-CM | POA: Diagnosis not present

## 2020-09-15 NOTE — Telephone Encounter (Signed)
   Notes to clinic Is this patient still associated with your practice? Gwinda Passe prescribed, PCP not in [place].

## 2020-09-23 ENCOUNTER — Inpatient Hospital Stay: Admission: RE | Admit: 2020-09-23 | Payer: Medicare HMO | Source: Ambulatory Visit

## 2020-09-24 NOTE — Progress Notes (Signed)
Cardiology Office Note:    Date:  09/26/2020   ID:  Amy Cline, DOB 1956/02/26, MRN [id number]  PCP:  Norm Salt, PA  [facility] Cardiologist:  Meriam Sprague, MD  [facility] Electrophysiologist:  None   Referring MD: Norm Salt, PA    History of Present Illness:    Amy Cline is a 64 y.o. female with a hx of HTN, HLD and fibromyalgia who returns to clinic for follow-up.  During our last visit in 07/11/20, the patient was presented after 3 syncopal events as well as some intermittent shortness of breath and chest pain. Specifically, she stated that on 06/23/2020, she was walking to the bathroom and just remembers waking up on the floor. No preceding chest pain, palpitations, SOB, nausea or vomiting. Had 2 other episodes like that over the past year. Extensive work-up including myoview, TTE and cardiac monitor reassuringly normal (had 6 episodes of SVT with longest 6 beats).   Since our visit, the patient states that she is doing overall well. Palpitations significantly improved on metoprolol. Blood pressure is well controlled. She is having episodes of vertigo for which she plans to see her PCP. Also with lower extremity pain for which she has been started on amitriptyline and lyrica. MRI of her spine showed multiple levels of disc bulging which she thinks may be contributing to her LE pain. Denies any chest pain, fevers, chills, n/v, orthopnea, LE edema or PND.  Past Medical History:  Diagnosis Date  . Arthritis    Bil hands  . Bladder prolapse, female, acquired   . Breast mass    left  . Chronic headaches   . Coronary artery disease   . Fibromyalgia   . GERD (gastroesophageal reflux disease)   . Glaucoma   . Hyperlipemia   . Hypertension   . Imbalance   . Insomnia   . Osteoporosis   . Paresthesia   . Prediabetes   . Vitamin D deficiency   . Vitamin D deficiency     Past Surgical History:  Procedure Laterality Date  . ABDOMINAL  HYSTERECTOMY  2008  . APPENDECTOMY    . ARTERY BIOPSY Right 09/16/2018   Procedure: BIOPSY TEMPORAL ARTERY RIGHT;  Surgeon: Maeola Harman, MD;  Location: [facility] OR;  Service: Vascular;  Laterality: Right;  . BREAST BIOPSY     benign/left breast  . COLONOSCOPY  07/2018  . VAGINAL PROLAPSE REPAIR N/A 12/22/2019   Procedure: RECTOCELE REPAIR WITH GRAFT AND VAGINAL VAULT SUSPENSION/CYSTOSCOPY;  Surgeon: Alfredo Martinez, MD;  Location: [facility] ORS;  Service: Urology;  Laterality: N/A;    Current Medications: Current Meds  Medication Sig  . alendronate (FOSAMAX) 35 MG tablet Take 1 tablet (35 mg total) by mouth every 7 (seven) days. Take with a full glass of water on an empty stomach.  Marland Kitchen amitriptyline (ELAVIL) 25 MG tablet Take 1 tablet (25 mg total) by mouth at bedtime.  Marland Kitchen atorvastatin (LIPITOR) 20 MG tablet TAKE 1 TABLET BY MOUTH EVERY DAY  . baclofen (LIORESAL) 10 MG tablet Take 1 tablet (10 mg total) by mouth 3 (three) times daily as needed for muscle spasms.  . Cholecalciferol (VITAMIN D3 PO) Take by mouth daily.  . Cyanocobalamin (VITAMIN B-12 PO) Take by mouth daily.  . diclofenac (VOLTAREN) 75 MG EC tablet TAKE 1 TABLET BY MOUTH TWICE A DAY  . losartan-hydrochlorothiazide (HYZAAR) 50-12.5 MG tablet Take 1 tablet by mouth daily.  . metoprolol succinate (TOPROL-XL) 25 MG 24 hr tablet Take 25  mg by mouth daily.  Marland Kitchen. omeprazole (PRILOSEC) 20 MG capsule TAKE 1 CAPSULE BY MOUTH EVERY DAY. Please make PCP appointment.  . pregabalin (LYRICA) 75 MG capsule Take 1 capsule (75 mg total) by mouth 2 (two) times daily.  Marland Kitchen. VITAMIN E PO Take by mouth daily.   Current Facility-Administered Medications for the 09/26/20 encounter (Office Visit) with Meriam SpraguePemberton, Markeith Jue E, MD  Medication  . 0.9 %  sodium chloride infusion     Allergies:   Patient has no known allergies.   Social History   Socioeconomic History  . Marital status: Legally Separated    Spouse name: Amy Cline  . Number of children: 2   . Years of education: 6216  . Highest education level: Not on file  Occupational History  . Occupation: Retired  Tobacco Use  . Smoking status: Never Smoker  . Smokeless tobacco: Never Used  Vaping Use  . Vaping Use: Never used  Substance and Sexual Activity  . Alcohol use: Not Currently    Comment: SOCIAL  . Drug use: No  . Sexual activity: Yes    Birth control/protection: Surgical  Other Topics Concern  . Not on file  Social History Narrative   Lives at home alone   Caffeine use: 1 cup coffee in AM, sometimes 1 decaf cup in afternoon. Green tea.    Ambidextrous   Social Determinants of Health   Financial Resource Strain: Not on file  Food Insecurity: Not on file  Transportation Needs: Not on file  Physical Activity: Not on file  Stress: Not on file  Social Connections: Not on file     Family History: The patient's family history includes Hypertension in her father and mother. There is no history of Migraines.  ROS:   Please see the history of present illness.    Review of Systems  Constitutional: Negative for chills and fever.  HENT: Positive for hearing loss.   Eyes: Negative for blurred vision.  Respiratory: Positive for shortness of breath.   Cardiovascular: Negative for chest pain, palpitations, orthopnea, claudication and leg swelling.  Gastrointestinal: Negative for heartburn, nausea and vomiting.  Genitourinary: Negative for hematuria.  Musculoskeletal: Positive for back pain, joint pain and myalgias.  Neurological: Positive for headaches. Negative for dizziness and loss of consciousness.  Endo/Heme/Allergies: Negative for polydipsia.  Psychiatric/Behavioral: Negative for substance abuse.    EKGs/Labs/Other Studies Reviewed:    The following studies were reviewed today: Myoview 08/03/20  Nuclear stress EF: 50%.  There was no ST segment deviation noted during stress.  No T wave inversion was noted during stress.  The study is normal.  This is a  low risk study.  The left ventricular ejection fraction is normal (55-65%).   1. Normal study without ischemia or infraction.  2. EF normal for this modality, 50%.  3. This is a low-risk study.   TTE 08/03/20:IMPRESSIONS 1. Left ventricular ejection fraction, by estimation, is 60 to 65%. The  left ventricle has normal function. The left ventricle has no regional  wall motion abnormalities. Left ventricular diastolic parameters are  consistent with Grade I diastolic  dysfunction (impaired relaxation).  2. Right ventricular systolic function is normal. The right ventricular  size is normal.  3. The mitral valve is degenerative. Trivial mitral valve regurgitation.  No evidence of mitral stenosis.  4. The aortic valve is tricuspid. Aortic valve regurgitation is not  visualized. Mild aortic valve sclerosis is present, with no evidence of  aortic valve stenosis.  5. The inferior vena  cava is normal in size with greater than 50%  respiratory variability, suggesting right atrial pressure of 3 mmHg.  Cardiac monitor 07/29/20:  Predominant rhythm is sinus with min HR 57bpm, max HR 179bpm, and average HR 87bpm.  4 runs of SVT. Fastest lasted 6 beats with HR 179bpm. Longest lasted 6 beats at HR 116bpm. Rare PACs, rare PVCs. No VT or Afib.   Recent Labs: 09/07/2020: ALT 27; BUN 20; Creatinine, Ser 0.82; Hemoglobin 13.4; Platelets 299; Potassium 4.1; Sodium 139; TSH 2.260  Recent Lipid Panel    Component Value Date/Time   CHOL 148 07/02/2019 1153   TRIG 201 (H) 07/02/2019 1153   HDL 52 07/02/2019 1153   CHOLHDL 2.8 07/02/2019 1153   LDLCALC 63 07/02/2019 1153      Physical Exam:    VS:  BP 118/78   Pulse 96   Ht 5\' 3"  (1.6 m)   Wt 160 lb 12.8 oz (72.9 kg)   SpO2 97%   BMI 28.48 kg/m     Wt Readings from Last 3 Encounters:  09/26/20 160 lb 12.8 oz (72.9 kg)  09/07/20 165 lb (74.8 kg)  08/22/20 163 lb (73.9 kg)     GEN:  Well nourished, well developed in no acute  distress HEENT: Normal NECK: No JVD; No carotid bruits CARDIAC: RRR, no murmurs, rubs, gallops RESPIRATORY:  Clear to auscultation without rales, wheezing or rhonchi  ABDOMEN: Soft, non-tender, non-distended MUSCULOSKELETAL:  No edema; No deformity  SKIN: Warm and dry NEUROLOGIC:  Alert and oriented x 3 PSYCHIATRIC:  Normal affect   ASSESSMENT:    1. Syncope and collapse   2. Chest pain, unspecified type   3. Palpitations   4. Essential hypertension   5. Pure hypercholesterolemia    PLAN:    In order of problems listed above:  #Syncope: Likely vasovagal as occurred when going to the bathroom. Cardiac work-up including TTE, myoview and cardiac monitor reassuringly normal. No further episodes since our last visit. -Nuclear stress without ischemia -TTE with normal EF, no significant valvular disease -Cardiac monitor with brief, infrequent episodes of SVT but no afib, VT, or pauses -Continue compression stockings, maintaining adequate hydration -Discussed if feels like she is going to faint, lay down and put legs up  #Palpitations: Improved with metoprolol. Cardiac monitor with infrequent episodes of SVT with longest 6 beats. Otherwise no significant arrhythmias. -Continue metoprolol  #Non-cardiac chest pain: Resolved. Cardiac work-up including myoview and TTE reassuringly normal. -Continue to monitor -TTE with normal BiV function -No ischemia on myoview  #Left Internal Carotid Artery Stenosis: 2019 carotid ultrasound with 40-59% stenosis. No history of TIA/CVA. -Continue atorvastatin 20mg  daily. LDL well-controlled at 63.  #HTN: Managed by PCP. -Losartan 50mg -HCTZ 12.5mg  -Metop 25mg  daily  #HLD: Last LDL 63, HDL 52 in 06/2019. Managed by PCP. -Continue atorvastatin 20mg  daily    Medication Adjustments/Labs and Tests Ordered: Current medicines are reviewed at length with the patient today.  Concerns regarding medicines are outlined above.  No orders of the  defined types were placed in this encounter.  No orders of the defined types were placed in this encounter.   Patient Instructions  Medication Instructions:  Your physician recommends that you continue on your current medications as directed. Please refer to the Current Medication list given to you today.  *If you need a refill on your cardiac medications before your next appointment, please call your pharmacy*   Lab Work: none If you have labs (blood work) drawn today and your tests  are completely normal, you will receive your results only by: Marland Kitchen MyChart Message (if you have MyChart) OR . A paper copy in the mail If you have any lab test that is abnormal or we need to change your treatment, we will call you to review the results.   Testing/Procedures: none   Follow-Up: At [facility], you and your health needs are our priority.  As part of our continuing mission to provide you with exceptional heart care, we have created designated Provider Care Teams.  These Care Teams include your primary Cardiologist (physician) and Advanced Practice Providers (APPs -  Physician Assistants and Nurse Practitioners) who all work together to provide you with the care you need, when you need it.  We recommend signing up for the patient portal called "MyChart".  Sign up information is provided on this After Visit Summary.  MyChart is used to connect with patients for Virtual Visits (Telemedicine).  Patients are able to view lab/test results, encounter notes, upcoming appointments, etc.  Non-urgent messages can be sent to your provider as well.   To learn more about what you can do with MyChart, go to [email or web address].    Your next appointment:   12 months  The format for your next appointment:   In Person  Provider:   Dr Shari Prows   Other Instructions      Signed, Meriam Sprague, MD  09/26/2020 11:37 AM    [facility]

## 2020-09-26 ENCOUNTER — Telehealth: Payer: Self-pay | Admitting: Neurology

## 2020-09-26 ENCOUNTER — Encounter: Payer: Self-pay | Admitting: Cardiology

## 2020-09-26 ENCOUNTER — Ambulatory Visit (INDEPENDENT_AMBULATORY_CARE_PROVIDER_SITE_OTHER): Payer: Medicare HMO | Admitting: Cardiology

## 2020-09-26 ENCOUNTER — Encounter: Payer: Medicare HMO | Admitting: Neurology

## 2020-09-26 VITALS — BP 118/78 | HR 96 | Ht 63.0 in | Wt 160.8 lb

## 2020-09-26 DIAGNOSIS — I10 Essential (primary) hypertension: Secondary | ICD-10-CM | POA: Diagnosis not present

## 2020-09-26 DIAGNOSIS — R002 Palpitations: Secondary | ICD-10-CM

## 2020-09-26 DIAGNOSIS — R079 Chest pain, unspecified: Secondary | ICD-10-CM

## 2020-09-26 DIAGNOSIS — E7800 Pure hypercholesterolemia, unspecified: Secondary | ICD-10-CM

## 2020-09-26 DIAGNOSIS — R55 Syncope and collapse: Secondary | ICD-10-CM | POA: Diagnosis not present

## 2020-09-26 NOTE — Telephone Encounter (Signed)
Patient NO SHOWED 2 apppointments, EMG and nerve conductions studies. Please do not reschedule her for these studies. If she calls back she may have follow up with NP but do not schedule for emg/ncs thanks

## 2020-09-26 NOTE — Patient Instructions (Signed)
Medication Instructions:  Your physician recommends that you continue on your current medications as directed. Please refer to the Current Medication list given to you today.  *If you need a refill on your cardiac medications before your next appointment, please call your pharmacy*   Lab Work: none If you have labs (blood work) drawn today and your tests are completely normal, you will receive your results only by: Marland Kitchen MyChart Message (if you have MyChart) OR . A paper copy in the mail If you have any lab test that is abnormal or we need to change your treatment, we will call you to review the results.   Testing/Procedures: none   Follow-Up: At [facility], you and your health needs are our priority.  As part of our continuing mission to provide you with exceptional heart care, we have created designated Provider Care Teams.  These Care Teams include your primary Cardiologist (physician) and Advanced Practice Providers (APPs -  Physician Assistants and Nurse Practitioners) who all work together to provide you with the care you need, when you need it.  We recommend signing up for the patient portal called "MyChart".  Sign up information is provided on this After Visit Summary.  MyChart is used to connect with patients for Virtual Visits (Telemedicine).  Patients are able to view lab/test results, encounter notes, upcoming appointments, etc.  Non-urgent messages can be sent to your provider as well.   To learn more about what you can do with MyChart, go to [email or web address].    Your next appointment:   12 months  The format for your next appointment:   In Person  Provider:   Dr Shari Prows   Other Instructions

## 2020-09-27 NOTE — Telephone Encounter (Signed)
It looks like pt arrived 15 min late to her NCS/EMG and was rescheduled before this note was created, would you like her to be rescheduled to an OV with an NP?

## 2020-09-27 NOTE — Telephone Encounter (Signed)
ok 

## 2020-09-27 NOTE — Telephone Encounter (Signed)
That is ok we can leave it as is thanks

## 2020-10-03 ENCOUNTER — Ambulatory Visit: Payer: Medicare HMO | Admitting: Family Medicine

## 2020-10-03 ENCOUNTER — Ambulatory Visit: Payer: Medicare HMO | Admitting: Podiatry

## 2020-10-05 ENCOUNTER — Ambulatory Visit: Payer: Medicare HMO | Admitting: Physical Therapy

## 2020-10-06 ENCOUNTER — Other Ambulatory Visit (INDEPENDENT_AMBULATORY_CARE_PROVIDER_SITE_OTHER): Payer: Self-pay | Admitting: Primary Care

## 2020-10-06 DIAGNOSIS — E785 Hyperlipidemia, unspecified: Secondary | ICD-10-CM

## 2020-10-23 NOTE — Progress Notes (Signed)
     Full Name: Amy Cline Gender: Female MRN #: [id number] Date of Birth: 06/19/1956    Visit Date: 10/24/2020 14:00 Age: 64 Years Examining Physician: Antonia Ahern, MD  Referring Provider:  Vanstory, Ashley N, PA Primary Care Physician:  Vanstory, Ashley N, PA  History: Her right arm feels numb and when she grabs something she feels like she has weakness in the right hand. It wakes her up in the middle of the night(the right hand tingly and numb). She endorses right arm weakness.  Summary: EMG/NCS was performed on the right upper extremity. All nerves and muscles (as indicated in the following tables) were within normal limits.       Conclusion: This is a normal study of the right upper extremity. No electrophysiologic evidence for mononeuropathy, polyneuropathy, radiculopathy or muscle disease.   Antonia Ahern, M.D.  [facility] [address] [place], [place] [address] Tel: [phone] Fax: [phone]  Verbal informed consent was obtained from the patient, patient was informed of potential risk of procedure, including bruising, bleeding, hematoma formation, infection, muscle weakness, muscle pain, numbness, among others.         MNC    Nerve / Sites Muscle Latency Ref. Amplitude Ref. Rel Amp Segments Distance Velocity Ref. Area    ms ms mV mV %  cm m/s m/s mVms  R Median - APB     Wrist APB 3.4 ?4.4 5.4 ?4.0 100 Wrist - APB 7   19.1     Upper arm APB 7.5  5.3  98 Upper arm - Wrist 21 52 ?49 18.5  R Ulnar - ADM     Wrist ADM 2.5 ?3.3 11.5 ?6.0 100 Wrist - ADM 7   31.5     B.Elbow ADM 5.5  11.1  96.2 B.Elbow - Wrist 19 64 ?49 30.0     A.Elbow ADM 7.1  10.6  96.3 A.Elbow - B.Elbow 10 62 ?49 28.9         A.Elbow - Wrist             SNC    Nerve / Sites Rec. Site Peak Lat Ref.  Amp Ref. Segments Distance Peak Diff Ref.    ms ms V V  cm ms ms  R Median, Ulnar - Transcarpal comparison     Median Palm Wrist 2.1 ?2.2 83 ?35 Median Palm - Wrist 8        Ulnar Palm Wrist 2.0 ?2.2 15 ?12 Ulnar Palm - Wrist 8          Median Palm - Ulnar Palm  0.0 ?0.4  R Median - Orthodromic (Dig II, Mid palm)     Dig II Wrist 2.9 ?3.4 16 ?10 Dig II - Wrist 13    R Ulnar - Orthodromic, (Dig V, Mid palm)     Dig V Wrist 2.7 ?3.1 6 ?5 Dig V - Wrist 11             F  Wave    Nerve F Lat Ref.   ms ms  R Ulnar - ADM 26.1 ?32.0       EMG Summary Table    Spontaneous MUAP Recruitment  Muscle IA Fib PSW Fasc Other Amp Dur. Poly Pattern  R. Deltoid Normal None None None _______ Normal Normal Normal Normal  R. Cervical paraspinals (low) Normal None None None _______ Normal Normal Normal Normal  R. Triceps brachii Normal None None None _______ Normal Normal Normal Normal  R. Pronator teres   Normal None None None _______ Normal Normal Normal Normal  R. First dorsal interosseous Normal None None None _______ Normal Normal Normal Normal  R. Opponens pollicis Normal None None None _______ Normal Normal Normal Normal

## 2020-10-24 ENCOUNTER — Telehealth: Payer: Self-pay | Admitting: Neurology

## 2020-10-24 ENCOUNTER — Ambulatory Visit (INDEPENDENT_AMBULATORY_CARE_PROVIDER_SITE_OTHER): Payer: Medicare HMO | Admitting: Neurology

## 2020-10-24 DIAGNOSIS — R202 Paresthesia of skin: Secondary | ICD-10-CM

## 2020-10-24 DIAGNOSIS — M542 Cervicalgia: Secondary | ICD-10-CM | POA: Diagnosis not present

## 2020-10-24 DIAGNOSIS — M79601 Pain in right arm: Secondary | ICD-10-CM | POA: Diagnosis not present

## 2020-10-24 DIAGNOSIS — R29898 Other symptoms and signs involving the musculoskeletal system: Secondary | ICD-10-CM

## 2020-10-24 DIAGNOSIS — R531 Weakness: Secondary | ICD-10-CM

## 2020-10-24 DIAGNOSIS — R200 Anesthesia of skin: Secondary | ICD-10-CM

## 2020-10-24 DIAGNOSIS — Z0289 Encounter for other administrative examinations: Secondary | ICD-10-CM

## 2020-10-24 DIAGNOSIS — G8929 Other chronic pain: Secondary | ICD-10-CM | POA: Diagnosis not present

## 2020-10-24 NOTE — Procedures (Signed)
Full Name: Amy Cline Gender: Female MRN #: [id number] Date of Birth: November 13, 1955    Visit Date: 10/24/2020 14:00 Age: 65 Years Examining Physician: Naomie Dean, MD  Referring Provider:  Norm Salt, PA Primary Care Physician:  Norm Salt, PA  History: Her right arm feels numb and when she grabs something she feels like she has weakness in the right hand. It wakes her up in the middle of the night(the right hand tingly and numb). She endorses right arm weakness.  Summary: EMG/NCS was performed on the right upper extremity. All nerves and muscles (as indicated in the following tables) were within normal limits.       Conclusion: This is a normal study of the right upper extremity. No electrophysiologic evidence for mononeuropathy, polyneuropathy, radiculopathy or muscle disease.   Naomie Dean, M.D.  [facility] [address] [place], [place] [address] Tel: [phone] Fax: [phone]  Verbal informed consent was obtained from the patient, patient was informed of potential risk of procedure, including bruising, bleeding, hematoma formation, infection, muscle weakness, muscle pain, numbness, among others.         MNC    Nerve / Sites Muscle Latency Ref. Amplitude Ref. Rel Amp Segments Distance Velocity Ref. Area    ms ms mV mV %  cm m/s m/s mVms  R Median - APB     Wrist APB 3.4 ?4.4 5.4 ?4.0 100 Wrist - APB 7   19.1     Upper arm APB 7.5  5.3  98 Upper arm - Wrist 21 52 ?49 18.5  R Ulnar - ADM     Wrist ADM 2.5 ?3.3 11.5 ?6.0 100 Wrist - ADM 7   31.5     B.Elbow ADM 5.5  11.1  96.2 B.Elbow - Wrist 19 64 ?49 30.0     A.Elbow ADM 7.1  10.6  96.3 A.Elbow - B.Elbow 10 62 ?49 28.9         A.Elbow - Wrist             SNC    Nerve / Sites Rec. Site Peak Lat Ref.  Amp Ref. Segments Distance Peak Diff Ref.    ms ms V V  cm ms ms  R Median, Ulnar - Transcarpal comparison     Median Palm Wrist 2.1 ?2.2 83 ?35 Median Palm - Wrist 8        Ulnar Palm Wrist 2.0 ?2.2 15 ?12 Ulnar Palm - Wrist 8          Median Palm - Ulnar Palm  0.0 ?0.4  R Median - Orthodromic (Dig II, Mid palm)     Dig II Wrist 2.9 ?3.4 16 ?10 Dig II - Wrist 13    R Ulnar - Orthodromic, (Dig V, Mid palm)     Dig V Wrist 2.7 ?3.1 6 ?5 Dig V - Wrist 33             F  Wave    Nerve F Lat Ref.   ms ms  R Ulnar - ADM 26.1 ?32.0       EMG Summary Table    Spontaneous MUAP Recruitment  Muscle IA Fib PSW Fasc Other Amp Dur. Poly Pattern  R. Deltoid Normal None None None _______ Normal Normal Normal Normal  R. Cervical paraspinals (low) Normal None None None _______ Normal Normal Normal Normal  R. Triceps brachii Normal None None None _______ Normal Normal Normal Normal  R. Pronator teres  Normal None None None _______ Normal Normal Normal Normal  R. First dorsal interosseous Normal None None None _______ Normal Normal Normal Normal  R. Opponens pollicis Normal None None None _______ Normal Normal Normal Normal

## 2020-10-24 NOTE — Progress Notes (Signed)
See procedure note.

## 2020-10-24 NOTE — Telephone Encounter (Signed)
[employer] medicare/medicaid order sent to [facility]. They will obtain the auth and reach out to the patient to schedule.  

## 2020-10-24 NOTE — Progress Notes (Signed)
History: Her right arm feels numb and when she grabs something she feels like she has weakness in the right hand. It wakes her up in the middle of the night(the right hand tingly and numb). She endorses right arm weakness. Here today for EMG/NCS of the right arm. She has chronic low back pain but is already treated by another medical doctor and is in the process of work up for a cyst. MRI of the brain and cervical spine is pending.    EMG/NCS was normal today which makes it unlikely to be a peripheral nerve disorder(CTS or Ulnar neuropathy). No radiculopathy was found however nerve impingement can evade detection on this test and she has chronic neck pain so will add MRI cervicl spine. Here with interpreter today. Explained she did make an appointment but did not show up for the MRI of the brain, she has to call back to reschedule and also we will send over the MRI cervical spine for authorization. MRI Brain has already been approved. She reports today she also has "Fibromyalgia" and she is waiting for her primary care to get set up for an ultrasound to evaluate her cysts that may be causing her lower extremity problems. If MRI brain and cervical spine unremarkable she likely has to go to pain management possibly for ESI to see if those may help if degenerative changes seen in the brain.   Orders Placed This Encounter  Procedures  . MR CERVICAL SPINE WO CONTRAST   I spent 30 minutes of face-to-face and non-face-to-face time with patient on the  1. Chronic neck pain   2. Right arm pain   3. Numbness and tingling of right arm    diagnosis.  This included previsit chart review, lab review, study review, order entry, electronic health record documentation, patient education on the different diagnostic and therapeutic options, counseling and coordination of care, risks and benefits of management, compliance, or risk factor reduction. This does not include time spent on emg/ncs.

## 2020-10-26 DIAGNOSIS — M2550 Pain in unspecified joint: Secondary | ICD-10-CM | POA: Diagnosis not present

## 2020-10-26 DIAGNOSIS — E785 Hyperlipidemia, unspecified: Secondary | ICD-10-CM | POA: Diagnosis not present

## 2020-10-26 DIAGNOSIS — I10 Essential (primary) hypertension: Secondary | ICD-10-CM | POA: Diagnosis not present

## 2020-10-26 DIAGNOSIS — L309 Dermatitis, unspecified: Secondary | ICD-10-CM | POA: Diagnosis not present

## 2020-10-26 DIAGNOSIS — M797 Fibromyalgia: Secondary | ICD-10-CM | POA: Diagnosis not present

## 2020-10-26 DIAGNOSIS — K219 Gastro-esophageal reflux disease without esophagitis: Secondary | ICD-10-CM | POA: Diagnosis not present

## 2020-10-26 DIAGNOSIS — N8110 Cystocele, unspecified: Secondary | ICD-10-CM | POA: Diagnosis not present

## 2020-10-26 DIAGNOSIS — R002 Palpitations: Secondary | ICD-10-CM | POA: Diagnosis not present

## 2020-10-26 DIAGNOSIS — M810 Age-related osteoporosis without current pathological fracture: Secondary | ICD-10-CM | POA: Diagnosis not present

## 2020-10-26 DIAGNOSIS — E559 Vitamin D deficiency, unspecified: Secondary | ICD-10-CM | POA: Diagnosis not present

## 2020-11-09 ENCOUNTER — Other Ambulatory Visit: Payer: Self-pay

## 2020-11-09 ENCOUNTER — Ambulatory Visit (INDEPENDENT_AMBULATORY_CARE_PROVIDER_SITE_OTHER): Payer: Medicare HMO | Admitting: Family Medicine

## 2020-11-09 ENCOUNTER — Encounter: Payer: Self-pay | Admitting: Family Medicine

## 2020-11-09 VITALS — BP 138/82 | Ht 63.0 in | Wt 162.0 lb

## 2020-11-09 DIAGNOSIS — M545 Low back pain, unspecified: Secondary | ICD-10-CM | POA: Diagnosis not present

## 2020-11-09 DIAGNOSIS — G8929 Other chronic pain: Secondary | ICD-10-CM

## 2020-11-09 NOTE — Progress Notes (Addendum)
PCP: Norm Salt, PA  Subjective:   HPI: Patient is a 65 y.o. female here for low back pain.  9/15: Interpreter was present for today's visit. Patient reports she has had fairly severe low back pain with radiation into the right lower extremity for several weeks. She has known history of fibromyalgia but feels like this pain is different. Reports numbness and tingling down the right leg into the foot and the digits. No bowel or bladder dysfunction. No weakness. She is currently taking gabapentin. She had previously been on Cymbalta but states that she has stopped this.  Notes also indicate that at some point she was taking nortriptyline.  She is not certain if she is tried Lyrica. She states that this low back pain started after she suffered a fall in the bathtub. She is uncertain how she fell but states she just passed out and lost consciousness for a few minutes. Of concern she states that she had 3 episodes of sudden syncope last year and she does have known history of coronary artery disease but does not have a cardiologist currently. For the current pain she went to an outside facility on August 24 and was given prednisone 20 mg for a week and tizanidine which did not provide much relief. Then on September 8 she went to emergency department and was prescribed meloxicam and dexamethasone.  Is also seem to have not helped her very much.  10/13: Interpreter present for today's visit. Patient returns stating she feels about the same. Has done one visit of physical therapy so far. Doing some stretches at home and using tennis ball to roll on muscles of her back but feels this makes the pain worse Taking voltaren twice a day and has tried both baclofen and flexeril - feels the flexeril helps more Scheduled to have lumbar spine MRI on 10/29 - was moved back because she had a cardiac monitor. Taking ASA 81mg  now.  11/8: MRI results reviewed and discussed with patient and  interpreter.  Multilevel degenerative changes but only mild narrowing at multiple levels.  Discussed restarting physical therapy, increase in medications (gabapentin, cymbalta), consultation with neurosurgery for possible ESIs.  She will restart physical therapy.  Also noted a cystic lesion in pelvis - advised to follow-up with PCP about this - measures 2.8x1.7cm.  She reports having had hysterectomy and believes had oophorectomy as well.  She verbalized understanding regarding follow up with them.  Follow up with 13/8 in 6 weeks for her back.  11/09/20: Patient reports she feels the same compared to last visit. She did not restart physical therapy. She is taking lyrica, amitriptyline, voltaren, and baclofen. Pain right side of low back worse than left. Radiates into right lower extremity. She is scheduled to have cervical spine and brain MRIs tomorrow by neurology.  Past Medical History:  Diagnosis Date  . Arthritis    Bil hands  . Bladder prolapse, female, acquired   . Breast mass    left  . Chronic headaches   . Coronary artery disease   . Fibromyalgia   . GERD (gastroesophageal reflux disease)   . Glaucoma   . Hyperlipemia   . Hypertension   . Imbalance   . Insomnia   . Osteoporosis   . Paresthesia   . Prediabetes   . Vitamin D deficiency   . Vitamin D deficiency     Current Outpatient Medications on File Prior to Visit  Medication Sig Dispense Refill  . alendronate (FOSAMAX) 35 MG tablet  Take 1 tablet (35 mg total) by mouth every 7 (seven) days. Take with a full glass of water on an empty stomach. 4 tablet 11  . amitriptyline (ELAVIL) 25 MG tablet Take 1 tablet (25 mg total) by mouth at bedtime. 90 tablet 3  . atorvastatin (LIPITOR) 20 MG tablet TAKE 1 TABLET BY MOUTH EVERY DAY 90 tablet 0  . baclofen (LIORESAL) 10 MG tablet Take 1 tablet (10 mg total) by mouth 3 (three) times daily as needed for muscle spasms. 60 each 1  . Cholecalciferol (VITAMIN D3 PO) Take by mouth daily.     . Cyanocobalamin (VITAMIN B-12 PO) Take by mouth daily.    . diclofenac (VOLTAREN) 75 MG EC tablet TAKE 1 TABLET BY MOUTH TWICE A DAY 60 tablet 1  . losartan-hydrochlorothiazide (HYZAAR) 50-12.5 MG tablet Take 1 tablet by mouth daily.    . Melatonin 10 MG CAPS at bedtime.    . metoprolol succinate (TOPROL-XL) 25 MG 24 hr tablet Take 25 mg by mouth daily.    Marland Kitchen omeprazole (PRILOSEC) 20 MG capsule TAKE 1 CAPSULE BY MOUTH EVERY DAY. Please make PCP appointment. 30 capsule 0  . pregabalin (LYRICA) 75 MG capsule Take 1 capsule (75 mg total) by mouth 2 (two) times daily. 60 capsule 3  . VITAMIN E PO Take by mouth daily.     Current Facility-Administered Medications on File Prior to Visit  Medication Dose Route Frequency Provider Last Rate Last Admin  . 0.9 %  sodium chloride infusion  500 mL Intravenous Once Meryl Dare, MD        Past Surgical History:  Procedure Laterality Date  . ABDOMINAL HYSTERECTOMY  2008  . APPENDECTOMY    . ARTERY BIOPSY Right 09/16/2018   Procedure: BIOPSY TEMPORAL ARTERY RIGHT;  Surgeon: Maeola Harman, MD;  Location[facility];  Service: Vascular;  Laterality: Right;  . BREAST BIOPSY     benign/left breast  . COLONOSCOPY  07/2018  . VAGINAL PROLAPSE REPAIR N/A 12/22/2019   Procedure: RECTOCELE REPAIR WITH GRAFT AND VAGINAL VAULT SUSPENSION/CYSTOSCOPY;  Surgeon: Alfredo Martinez, MD;  Location: [facility] ORS;  Service: Urology;  Laterality: N/A;    No Known Allergies  Social History   Socioeconomic History  . Marital status: Legally Separated    Spouse name: Maisie Fus  . Number of children: 2  . Years of education: 44  . Highest education level: Not on file  Occupational History  . Occupation: Retired  Tobacco Use  . Smoking status: Never Smoker  . Smokeless tobacco: Never Used  Vaping Use  . Vaping Use: Never used  Substance and Sexual Activity  . Alcohol use: Not Currently    Comment: SOCIAL  . Drug use: No  . Sexual activity: Yes    Birth  control/protection: Surgical  Other Topics Concern  . Not on file  Social History Narrative   Lives at home alone   Caffeine use: 1 cup coffee in AM, sometimes 1 decaf cup in afternoon. Green tea.    Ambidextrous   Social Determinants of Health   Financial Resource Strain: Not on file  Food Insecurity: Not on file  Transportation Needs: Not on file  Physical Activity: Not on file  Stress: Not on file  Social Connections: Not on file  Intimate Partner Violence: Not on file    Family History  Problem Relation Age of Onset  . Hypertension Mother   . Hypertension Father   . Migraines Neg Hx     BP  138/82   Ht 5\' 3"  (1.6 m)   Wt 162 lb (73.5 kg)   BMI 28.70 kg/m   No flowsheet data found.  No flowsheet data found.  Review of Systems: See HPI above.     Objective:  Physical Exam:  Gen: NAD, comfortable in exam room  Back: No gross deformity, scoliosis. No midline or bony TTP. FROM. Strength LEs 5/5 all muscle groups except 4/5 knee flexion, 3/5 plantar and dorsiflexion bilaterally - question effort during exam.   Negative SLRs. Negative logrolls. Sensation intact to light touch bilaterally.  Assessment & Plan:  1. Low back pain with radiation into right lower extremity - multilevel degenerative changes on MRI of lumbar spine.  She's currently taking lyrica, diclofenac, amitriptyline with baclofen as needed.  She only did one visit of PT and we discussed restarting this is a consideration.  Trial of ESI on right side either at L4-5 or L5-S1 - she will consider this.  F/u in 6 weeks.

## 2020-11-09 NOTE — Patient Instructions (Signed)
Continue your home exercises. Consider injection(s) for your low back pain - after you get your MRIs tomorrow and get the results, let us know if you'd like to proceed with this. Continue your current medicines. Call [facility] about the ultrasound and placing an order for this. Follow up with me in about 6 weeks otherwise.

## 2020-11-10 ENCOUNTER — Ambulatory Visit
Admission: RE | Admit: 2020-11-10 | Discharge: 2020-11-10 | Disposition: A | Discharge status: Home / Self Care | Payer: Medicare HMO | Source: Ambulatory Visit | Attending: Neurology | Admitting: Neurology

## 2020-11-10 ENCOUNTER — Other Ambulatory Visit: Payer: Self-pay

## 2020-11-10 DIAGNOSIS — R200 Anesthesia of skin: Secondary | ICD-10-CM

## 2020-11-10 DIAGNOSIS — R531 Weakness: Secondary | ICD-10-CM

## 2020-11-10 DIAGNOSIS — R51 Headache with orthostatic component, not elsewhere classified: Secondary | ICD-10-CM

## 2020-11-10 DIAGNOSIS — M79601 Pain in right arm: Secondary | ICD-10-CM

## 2020-11-10 DIAGNOSIS — M542 Cervicalgia: Secondary | ICD-10-CM

## 2020-11-10 DIAGNOSIS — G8929 Other chronic pain: Secondary | ICD-10-CM

## 2020-11-10 DIAGNOSIS — R270 Ataxia, unspecified: Secondary | ICD-10-CM

## 2020-11-10 DIAGNOSIS — R202 Paresthesia of skin: Secondary | ICD-10-CM

## 2020-11-10 DIAGNOSIS — R29898 Other symptoms and signs involving the musculoskeletal system: Secondary | ICD-10-CM

## 2020-11-10 DIAGNOSIS — H539 Unspecified visual disturbance: Secondary | ICD-10-CM

## 2020-11-10 DIAGNOSIS — R519 Headache, unspecified: Secondary | ICD-10-CM

## 2020-11-10 MED ORDER — GADOBENATE DIMEGLUMINE 529 MG/ML IV SOLN
15.0000 mL | Freq: Once | INTRAVENOUS | Status: AC | PRN
  Administered 2020-11-10: 15 mL via INTRAVENOUS

## 2020-11-14 ENCOUNTER — Telehealth: Payer: Self-pay | Admitting: *Deleted

## 2020-11-14 ENCOUNTER — Other Ambulatory Visit: Payer: Self-pay | Admitting: Neurology

## 2020-11-14 DIAGNOSIS — M4802 Spinal stenosis, cervical region: Secondary | ICD-10-CM

## 2020-11-14 NOTE — Telephone Encounter (Signed)
Done, ordered thanks

## 2020-11-14 NOTE — Telephone Encounter (Signed)
I called the pt back with [employer] (ID [phone]) and discussed results again with patient. The pt's questions were answered. She agreed to proceed with neurosurgery referral as discussed and I provided the office information. Pt has our office number for call back if needed. She will be on the lookout for a call from [facility] within the next 1-2 weeks. She verbalized appreciation for the call.

## 2020-11-14 NOTE — Telephone Encounter (Addendum)
I used [employer] (agent # [phone]) to call patient and she LVM (ok per DPR) advising patient in detail of the results noted below of MRI brain and C-spine. Advised per Dr Lucia Gaskins that she needs to see neurosurgery for the severe spinal canal stenosis and also pinched nerves in her cervical spine. Asked for call back to confirm receipt of message and discuss.   ----- Message from Anson Fret, MD sent at 11/14/2020  9:08 AM EST ----- Toma Copier: Please call to discuss with patient: MRI of the cervical spine shows severe spinal canal stenosis and also pinched nerves. She needs to be seen by neurosurgery, this is likely the cause of her symptoms and she may need surgery. thanks  MRI of the brain is normal for age thanks  Written by Anson Fret, MD on 11/14/2020 9:07 AM EST

## 2020-11-14 NOTE — Telephone Encounter (Signed)
Pt returned call and has several questions. Please call pt back with the interpreter.

## 2020-11-16 DIAGNOSIS — N644 Mastodynia: Secondary | ICD-10-CM | POA: Diagnosis not present

## 2020-11-16 DIAGNOSIS — R928 Other abnormal and inconclusive findings on diagnostic imaging of breast: Secondary | ICD-10-CM | POA: Diagnosis not present

## 2020-11-30 ENCOUNTER — Encounter: Payer: Self-pay | Admitting: Obstetrics & Gynecology

## 2020-11-30 ENCOUNTER — Other Ambulatory Visit: Payer: Self-pay

## 2020-11-30 ENCOUNTER — Ambulatory Visit (INDEPENDENT_AMBULATORY_CARE_PROVIDER_SITE_OTHER): Payer: Medicare HMO | Admitting: Obstetrics & Gynecology

## 2020-11-30 ENCOUNTER — Telehealth: Payer: Self-pay

## 2020-11-30 VITALS — BP 117/88 | HR 89 | Ht 63.0 in | Wt 162.9 lb

## 2020-11-30 DIAGNOSIS — N83201 Unspecified ovarian cyst, right side: Secondary | ICD-10-CM | POA: Diagnosis not present

## 2020-11-30 DIAGNOSIS — M5416 Radiculopathy, lumbar region: Secondary | ICD-10-CM

## 2020-11-30 NOTE — Progress Notes (Signed)
[place] appt on 12/13/20 at 11am, arrive at 10:45a.

## 2020-11-30 NOTE — Telephone Encounter (Signed)
Called Pt using [address] [name] id # [id number], to advise of [place] appt on 12/13/20 @ 11am & to arrive at 10:45 at [facility] 2nd fl.Pt verbalized understanding.

## 2020-12-01 NOTE — Progress Notes (Signed)
   Subjective:    Patient ID: Amy Cline, female    DOB: [birth year], 65 y.o.   MRN: [id number]  HPI  Pt presents of referral from her PCP due to a portion of adnexal cyst found on MRI of her lumbar area  Paraspinal and other soft tissues: Paraspinal soft tissues within normal limits. 2.8 x 1.7 cm partially imaged right hemipelvic cystic lesion may involve the right adnexa. (4: 5).  Pt has significant back pain from her disc disease.  Pt had  a "partial" hysterectomy.  She does not know if her ovaries were taken out.  She is not having pain in her pelvis.  Pt denies bleeding nor vaginal discharge.  She had surgical correction of her cystocele and has no urinary complaints.   Review of Systems  Constitutional: Negative.   Respiratory: Negative.   Cardiovascular: Negative.   Gastrointestinal: Negative.   Genitourinary: Negative.   Musculoskeletal: Positive for back pain.       Objective:   Physical Exam Vitals reviewed.  Constitutional:      General: She is not in acute distress.    Appearance: She is well-developed and well-nourished.  HENT:     Head: Normocephalic and atraumatic.  Eyes:     Conjunctiva/sclera: Conjunctivae normal.  Cardiovascular:     Rate and Rhythm: Normal rate.  Pulmonary:     Effort: Pulmonary effort is normal.  Abdominal:     General: Abdomen is flat. There is no distension.     Palpations: Abdomen is soft.     Tenderness: There is no abdominal tenderness.  Genitourinary:    Comments: Tanner V Vulva:  No lesion Vagina:  Pink, no lesions, no discharge, no blood, mild cystocele Cervix:  Surgically absent Uterus:  Surgically absent Right adnexa--non tender, no mass Left adnexa--non tender, no mass  Musculoskeletal:        General: No edema.  Skin:    General: Skin is warm and dry.  Neurological:     Mental Status: She is alert and oriented to person, place, and time.  Psychiatric:        Mood and Affect: Mood and affect and mood normal.     Vitals:   11/30/20 1556  BP: 117/88  Pulse: 89  Weight: 162 lb 14.4 oz (73.9 kg)  Height: 5\' 3"  (1.6 m)      Assessment & Plan:  65 yo female with incidental finding of possible adnexal cyst.  Pt also has asymptomatic cystocele  1.  Transabdominal & TVUS 2.  Video visit to review results.  30 minutes spent with patient during history and physical and review of records and documentation.  Eda Royale was interpreter.

## 2020-12-13 ENCOUNTER — Ambulatory Visit
Admission: RE | Admit: 2020-12-13 | Discharge: 2020-12-13 | Disposition: A | Discharge status: Home / Self Care | Payer: Medicare HMO | Source: Ambulatory Visit | Attending: Obstetrics & Gynecology | Admitting: Obstetrics & Gynecology

## 2020-12-13 ENCOUNTER — Other Ambulatory Visit: Payer: Self-pay

## 2020-12-13 DIAGNOSIS — N83201 Unspecified ovarian cyst, right side: Secondary | ICD-10-CM | POA: Diagnosis not present

## 2020-12-13 DIAGNOSIS — N83209 Unspecified ovarian cyst, unspecified side: Secondary | ICD-10-CM | POA: Diagnosis not present

## 2020-12-13 DIAGNOSIS — Z90710 Acquired absence of both cervix and uterus: Secondary | ICD-10-CM | POA: Diagnosis not present

## 2020-12-13 IMAGING — US US TRANSVAGINAL NON-OB
1 series · 15 of 25 positions shown · non-contrast
Comparison: None

CLINICAL DATA: RIGHT ovarian cyst, screening for cervical
malignancy; abnormal MRI [DATE]

EXAM:
ULTRASOUND PELVIS TRANSVAGINAL
TECHNIQUE: Transvaginal ultrasound examination of the pelvis was performed
including evaluation of the uterus, ovaries, adnexal regions, and
pelvic cul-de-sac.

[Series 1: us transvaginal non-ob · 29 acquisitions, 15 frames shown]
[im 1/29]
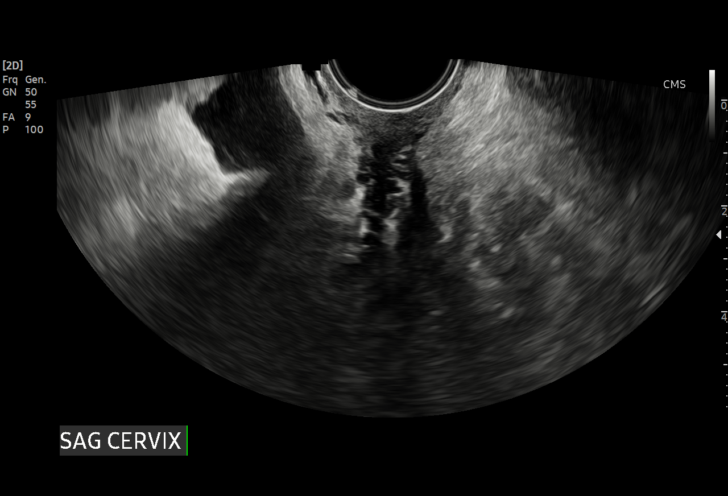
[im 3/29]
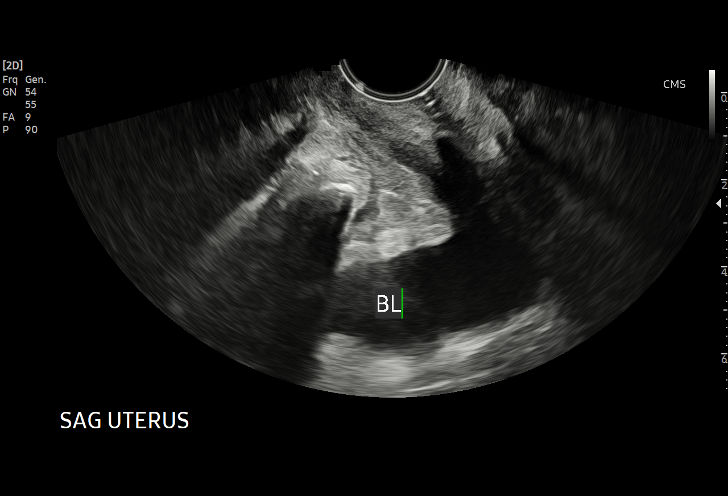
[im 5/29]
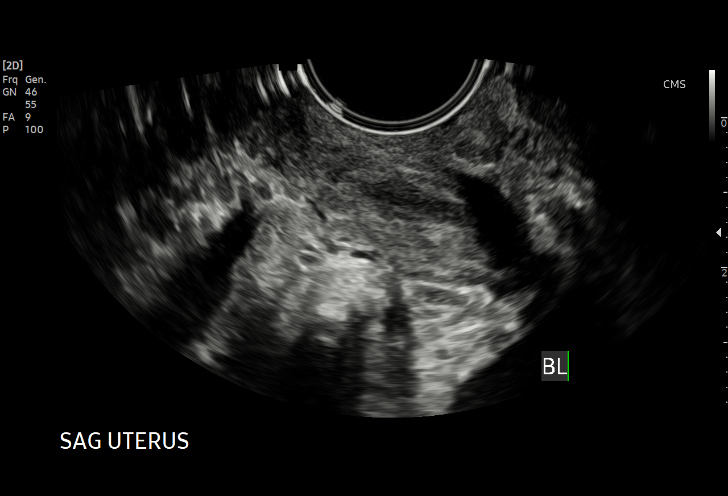
[im 6/29]
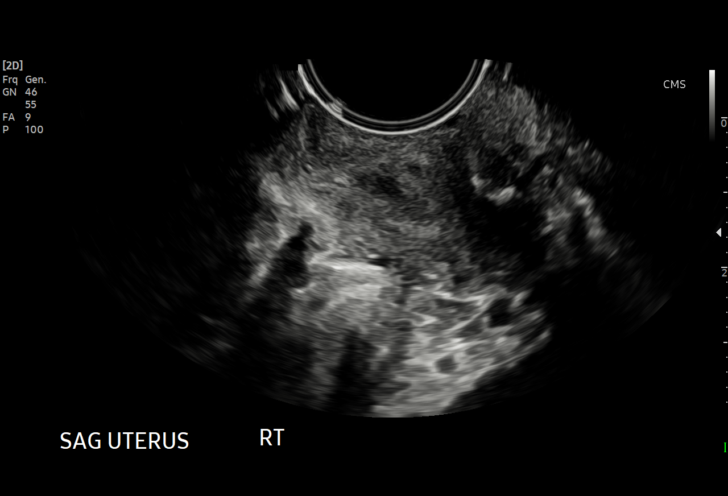
[im 9/29]
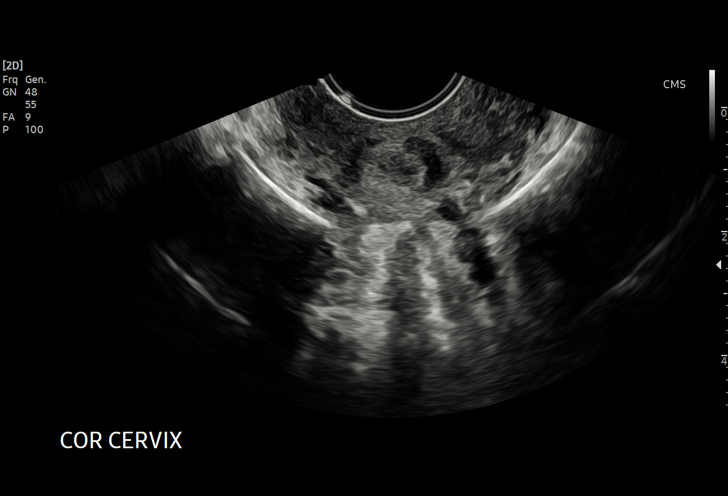
[im 11/29]
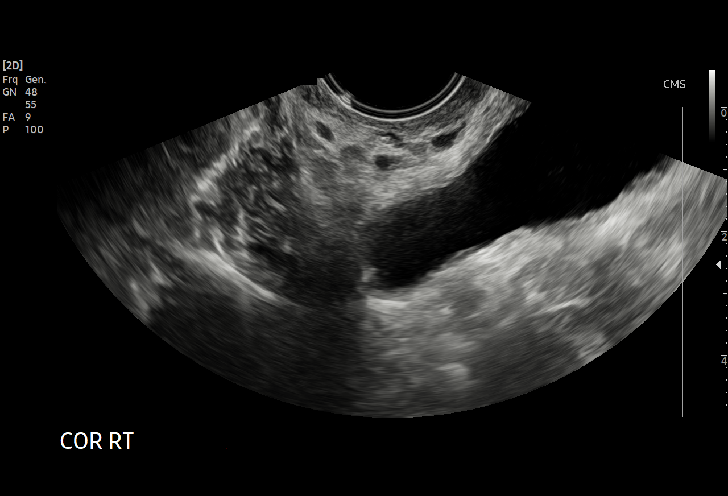
[im 12/29]
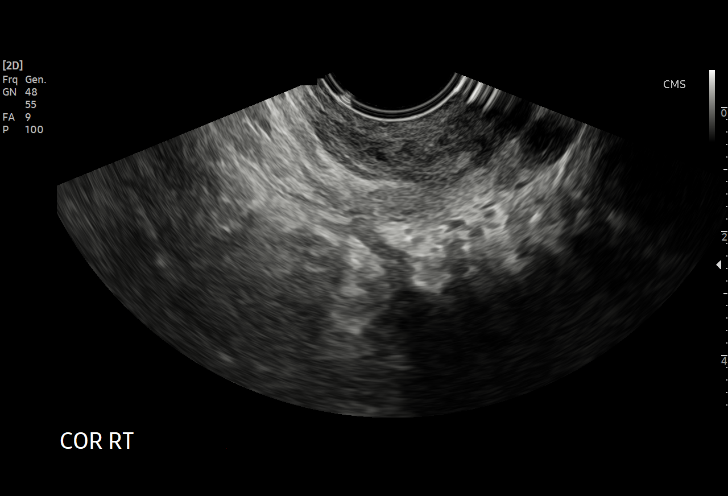
[im 15/29]
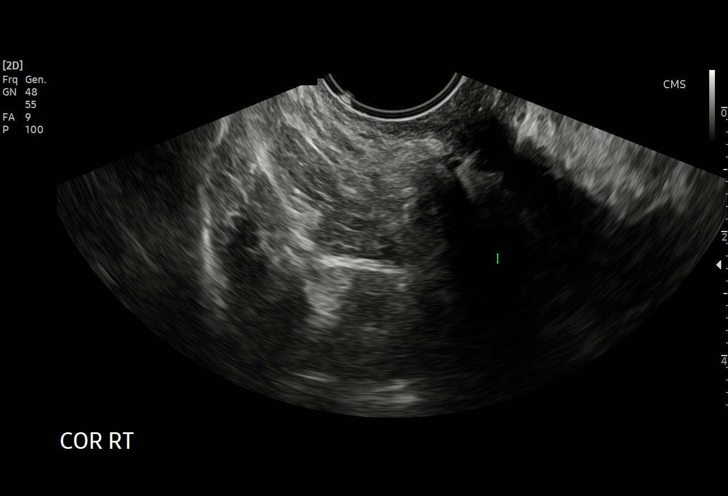
[im 17/29]
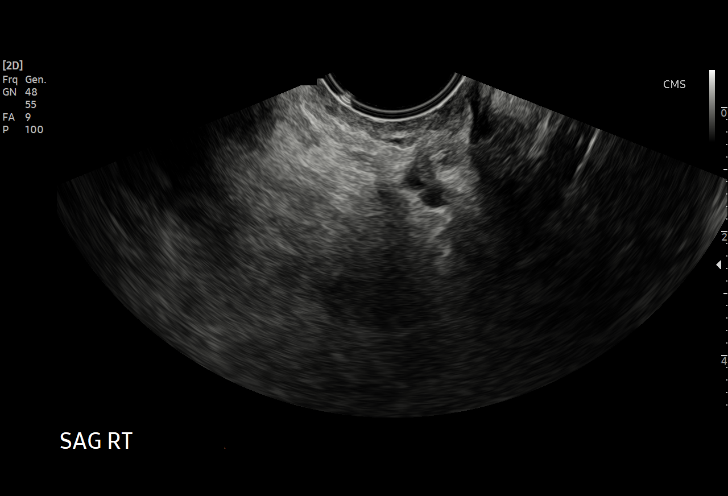
[im 18/29]
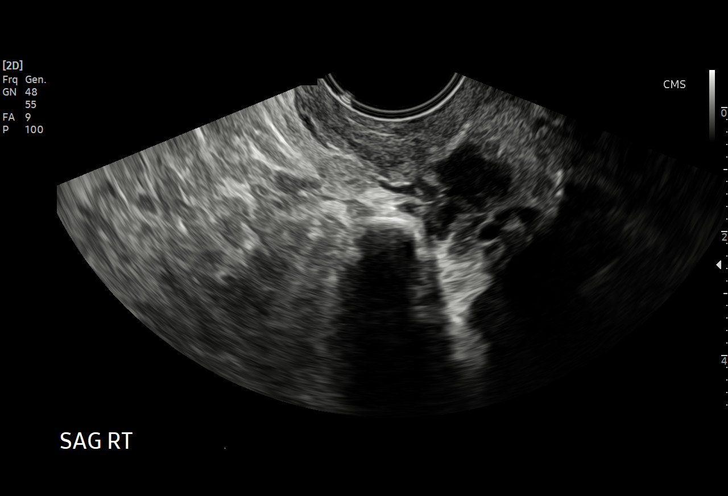
[im 20/29]
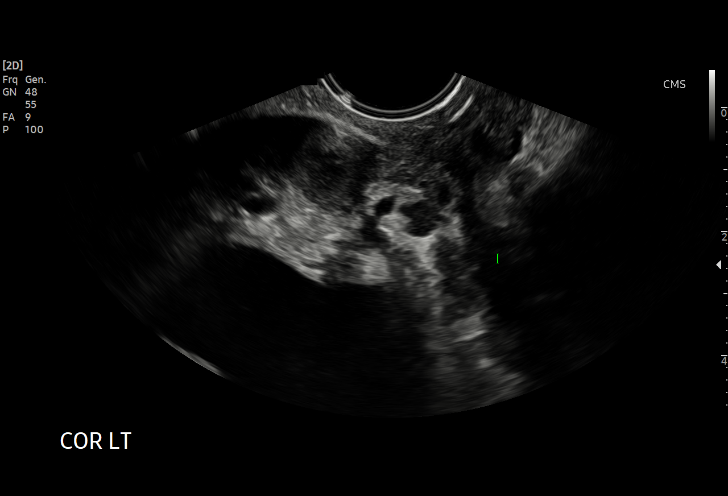
[im 23/29]
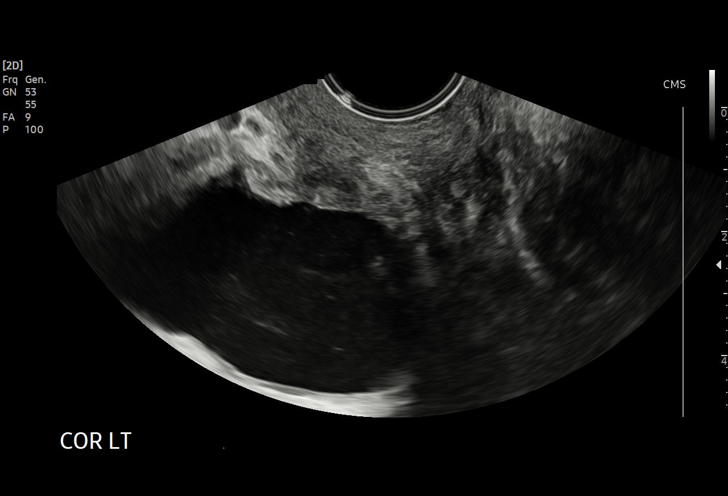
[im 24/29]
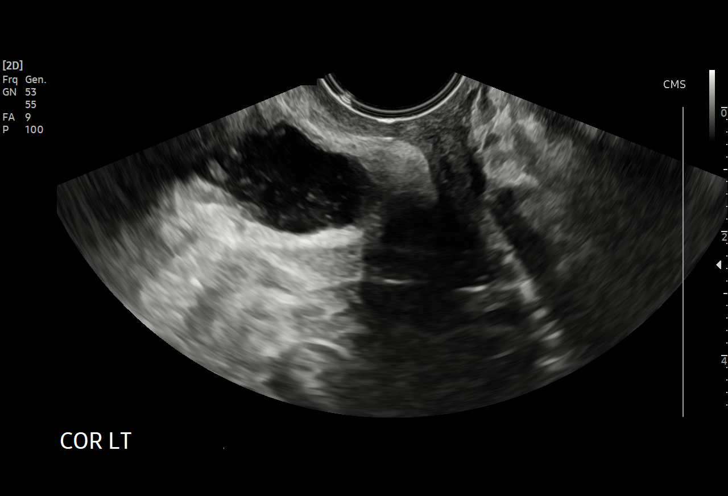
[im 26/29]
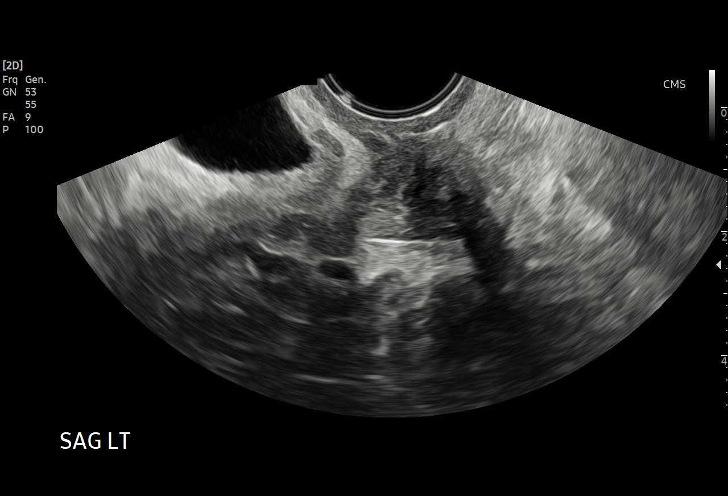
[im 29/29]
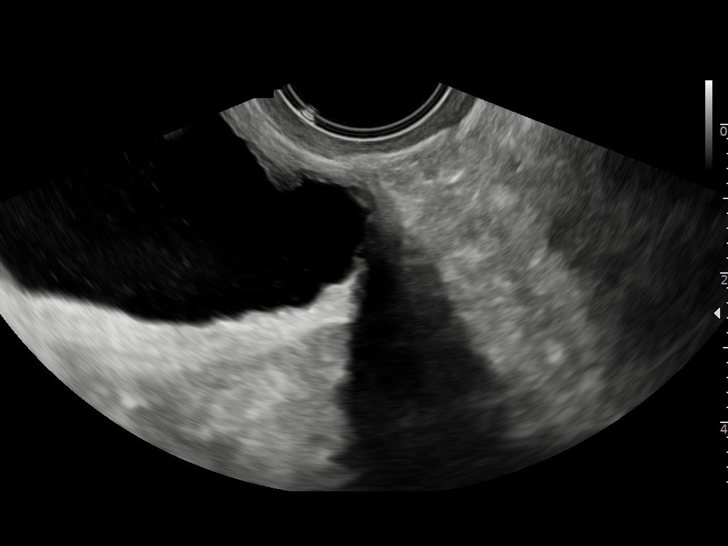

[15 of 25 positions shown; findings below may reference images not displayed]

FINDINGS: Uterus

Surgically absent, [42]

Endometrium

N/A

Right ovary

Not visualized, likely obscured by bowel

Left ovary

Not visualized, likely obscured by bowel

Other findings: No free pelvic fluid. No adnexal masses. Visualized
urinary bladder unremarkable. Soft tissue adjacent to the probe on
transvaginal imaging likely represents vaginal cuff, without
definite abnormality.
IMPRESSION: Post hysterectomy with nonvisualization of ovaries.

No pelvic sonographic abnormalities.

Specifically, no adnexal cystic lesion identified.

## 2020-12-14 DIAGNOSIS — K219 Gastro-esophageal reflux disease without esophagitis: Secondary | ICD-10-CM | POA: Diagnosis not present

## 2020-12-14 DIAGNOSIS — M797 Fibromyalgia: Secondary | ICD-10-CM | POA: Diagnosis not present

## 2020-12-14 DIAGNOSIS — M2550 Pain in unspecified joint: Secondary | ICD-10-CM | POA: Diagnosis not present

## 2020-12-14 DIAGNOSIS — N8110 Cystocele, unspecified: Secondary | ICD-10-CM | POA: Diagnosis not present

## 2020-12-14 DIAGNOSIS — E785 Hyperlipidemia, unspecified: Secondary | ICD-10-CM | POA: Diagnosis not present

## 2020-12-14 DIAGNOSIS — I10 Essential (primary) hypertension: Secondary | ICD-10-CM | POA: Diagnosis not present

## 2020-12-14 DIAGNOSIS — M810 Age-related osteoporosis without current pathological fracture: Secondary | ICD-10-CM | POA: Diagnosis not present

## 2020-12-14 DIAGNOSIS — E559 Vitamin D deficiency, unspecified: Secondary | ICD-10-CM | POA: Diagnosis not present

## 2020-12-14 DIAGNOSIS — I83813 Varicose veins of bilateral lower extremities with pain: Secondary | ICD-10-CM | POA: Diagnosis not present

## 2020-12-14 DIAGNOSIS — R002 Palpitations: Secondary | ICD-10-CM | POA: Diagnosis not present

## 2020-12-19 ENCOUNTER — Encounter: Payer: Self-pay | Admitting: Obstetrics & Gynecology

## 2020-12-19 DIAGNOSIS — N9489 Other specified conditions associated with female genital organs and menstrual cycle: Secondary | ICD-10-CM | POA: Insufficient documentation

## 2020-12-20 ENCOUNTER — Telehealth: Payer: Self-pay

## 2020-12-20 DIAGNOSIS — Z0100 Encounter for examination of eyes and vision without abnormal findings: Secondary | ICD-10-CM | POA: Diagnosis not present

## 2020-12-20 NOTE — Telephone Encounter (Addendum)
-----   Message from Amy Dukes, MD sent at 12/19/2020  6:53 AM EST ----- Need video visit to discuss results.  No cyst found.  Can consider MRI.  Please schedule a video visit.on day with [facility] is there.  Coordinate with Berton Mount at a time where I can be present at [facility].  (just for visit, not entire shift).[name] pt with [facility] Interpreter # [phone] and informed pt results and that she will discuss with the provider at her virtual appt scheduled on 12/23/19 with Dr. Vergie Living further managment.  Pt stated that she would not have concerns with accessing the MyChart video.  Pt verbalized understanding to all that was discussed and no further questions.   Leonette Nutting  12/20/20

## 2020-12-21 ENCOUNTER — Other Ambulatory Visit: Payer: Self-pay | Admitting: Family Medicine

## 2020-12-21 ENCOUNTER — Other Ambulatory Visit: Payer: Self-pay

## 2020-12-21 ENCOUNTER — Ambulatory Visit: Payer: Medicare HMO | Admitting: Family Medicine

## 2020-12-21 DIAGNOSIS — M545 Low back pain, unspecified: Secondary | ICD-10-CM

## 2020-12-21 DIAGNOSIS — G8929 Other chronic pain: Secondary | ICD-10-CM

## 2020-12-21 NOTE — Progress Notes (Signed)
Pt would like to proceed with lumbar ESI. Cathy from [facility] will call pt to schedule.

## 2020-12-22 ENCOUNTER — Encounter: Payer: Self-pay | Admitting: Obstetrics and Gynecology

## 2020-12-22 ENCOUNTER — Ambulatory Visit (INDEPENDENT_AMBULATORY_CARE_PROVIDER_SITE_OTHER): Payer: Medicare HMO | Admitting: Obstetrics and Gynecology

## 2020-12-22 ENCOUNTER — Other Ambulatory Visit: Payer: Self-pay

## 2020-12-22 VITALS — BP 138/88 | HR 79 | Ht 63.0 in | Wt 161.6 lb

## 2020-12-22 DIAGNOSIS — Z789 Other specified health status: Secondary | ICD-10-CM | POA: Insufficient documentation

## 2020-12-22 DIAGNOSIS — N949 Unspecified condition associated with female genital organs and menstrual cycle: Secondary | ICD-10-CM | POA: Diagnosis not present

## 2020-12-22 NOTE — Progress Notes (Signed)
Obstetrics and Gynecology Visit Return Patient Evaluation  Appointment Date: 12/22/2020  Primary Care Provider: Kerby Cline Clinic: [facility]  Chief Complaint: f/u u/s  History of Present Illness:  Amy Cline is a 65 y.o. with above CC. Patient initially seen by Amy Cline on 2/16 due to a, "2.8 x 1.7 cm partially imaged right hemipelvic cystic lesion may involve the right adnexa." This was seen on 08/12/20 MRI lumbar spine for w/u for her low back pain.  Exam negative with Amy Cline and she was set up for a pelvic u/s and follow up visit.  U/s was negative with no ovaries seen.  Patient states she had a hysterectomy that was done vaginally in [place] she was 66 and it was due to prolapse. She states that they told that they "took everything", but she isn't explicitly certain if they took her ovaries. She states she has tried to get records but has been unable to.   Review of Systems: as noted in the History of Present Illness.  Patient Active Problem List   Diagnosis Date Noted  . Language barrier 12/22/2020  . Adnexal mass 12/19/2020  . Lumbar radiculopathy 09/07/2020  . Right arm pain 03/26/2019  . Chronic migraine without aura without status migrainosus, not intractable 11/18/2018  . Rectocele, female 08/11/2018  . Scalp tenderness 08/11/2018  . Atypical chest pain 07/17/2016  . Essential hypertension 03/29/2016  . Fibromyalgia 03/29/2016  . Gastroesophageal reflux disease without esophagitis 03/29/2016  . Chronic tension-type headache, not intractable 08/16/2015   Medications:  [name] had no medications administered during this visit. Current Outpatient Medications  Medication Sig Dispense Refill  . alendronate (FOSAMAX) 35 MG tablet Take 1 tablet (35 mg total) by mouth every 7 (seven) days. Take with a full glass of water on an empty stomach. 4 tablet 11  . amitriptyline (ELAVIL) 25 MG tablet Take 1  tablet (25 mg total) by mouth at bedtime. 90 tablet 3  . atorvastatin (LIPITOR) 20 MG tablet TAKE 1 TABLET BY MOUTH EVERY DAY 90 tablet 0  . baclofen (LIORESAL) 10 MG tablet Take 1 tablet (10 mg total) by mouth 3 (three) times daily as needed for muscle spasms. (Patient not taking: Reported on 11/30/2020) 60 each 1  . Cholecalciferol (VITAMIN D3 PO) Take by mouth daily.    . Cyanocobalamin (VITAMIN B-12 PO) Take by mouth daily.    . diclofenac (VOLTAREN) 75 MG EC tablet TAKE 1 TABLET BY MOUTH TWICE A DAY (Patient not taking: Reported on 11/30/2020) 60 tablet 1  . losartan-hydrochlorothiazide (HYZAAR) 50-12.5 MG tablet Take 1 tablet by mouth daily.    . Melatonin 10 MG CAPS at bedtime. (Patient not taking: Reported on 11/30/2020)    . metoprolol succinate (TOPROL-XL) 25 MG 24 hr tablet Take 25 mg by mouth daily.    Marland Kitchen omeprazole (PRILOSEC) 20 MG capsule TAKE 1 CAPSULE BY MOUTH EVERY DAY. Please make PCP appointment. 30 capsule 0  . pregabalin (LYRICA) 75 MG capsule Take 1 capsule (75 mg total) by mouth 2 (two) times daily. 60 capsule 3  . VITAMIN E PO Take by mouth daily.     Current Facility-Administered Medications  Medication Dose Route Frequency Provider Last Rate Last Admin  . 0.9 %  sodium chloride infusion  500 mL Intravenous Once Amy Dare, MD        Allergies: has No Known Allergies.  Physical Exam:  BP 138/88   Pulse 79   Ht  5\' 3"  (1.6 m)   Wt 161 lb 9.6 oz (73.3 kg)   BMI 28.63 kg/m  Body mass index is 28.63 kg/m. General appearance: Well nourished, well developed female in no acute distress.  Neuro/Psych:  Normal mood and affect.    Radiology: Narrative & Impression  CLINICAL DATA:  RIGHT ovarian cyst, screening for cervical malignancy; abnormal MRI 08/12/2020  EXAM: ULTRASOUND PELVIS TRANSVAGINAL  TECHNIQUE: Transvaginal ultrasound examination of the pelvis was performed including evaluation of the uterus, ovaries, adnexal regions, and pelvic  cul-de-sac.  COMPARISON:  None  FINDINGS: Uterus  Surgically absent, 2008  Endometrium  N/A  Right ovary  Not visualized, likely obscured by bowel  Left ovary  Not visualized, likely obscured by bowel  Other findings: No free pelvic fluid. No adnexal masses. Visualized urinary bladder unremarkable. Soft tissue adjacent to the probe on transvaginal imaging likely represents vaginal cuff, without definite abnormality.  IMPRESSION: Post hysterectomy with nonvisualization of ovaries.  No pelvic sonographic abnormalities.  Specifically, no adnexal cystic lesion identified.   Electronically Signed   By: 2009 M.D.   On: 12/13/2020 15:16     Assessment: pt stable  Plan:  1. Adnexal cyst I told her that likely her ovaries were taken considering the age of her hysterectomy, but sometimes not always possible when done vaginally. I also told her that it's reassuring that nothing was seen on u/s but sometimes it can be hard to see ovaries on u/s without a uterus for reference, so I recommend a pelvis MRI to see if there is a cyst and exactly where, which she is amenable to - MR PELVIS W CONTRAST; Future - MR PELVIS W WO CONTRAST; Future  2. Language barrier Interpreter used   RTC: after MRI  02/12/2021 MD Attending [facility])

## 2020-12-22 NOTE — Progress Notes (Signed)
MRI scheduled for 01/05/21 @ 2:30p, scan begins at 3pm.

## 2020-12-23 NOTE — Addendum Note (Signed)
Addended by: Henrietta Dine on: 12/23/2020 10:39 AM   Modules accepted: Orders

## 2020-12-26 ENCOUNTER — Ambulatory Visit: Payer: Medicare HMO | Admitting: Family Medicine

## 2020-12-27 ENCOUNTER — Ambulatory Visit
Admission: RE | Admit: 2020-12-27 | Discharge: 2020-12-27 | Disposition: A | Discharge status: Home / Self Care | Payer: Medicare HMO | Source: Ambulatory Visit | Attending: Family Medicine | Admitting: Family Medicine

## 2020-12-27 ENCOUNTER — Other Ambulatory Visit: Payer: Self-pay

## 2020-12-27 DIAGNOSIS — M545 Low back pain, unspecified: Secondary | ICD-10-CM

## 2020-12-27 DIAGNOSIS — M47817 Spondylosis without myelopathy or radiculopathy, lumbosacral region: Secondary | ICD-10-CM | POA: Diagnosis not present

## 2020-12-27 IMAGING — XA Imaging study
2 series · 2 of 2 positions shown · non-contrast
Comparison: none

CLINICAL DATA: Lumbosacral spondylosis without myelopathy. Chronic
low back and right leg pain. No prior injections or surgery.

[Series 1: ortho adipose · 1 of 1 slices shown (1 of 2)]
[im 1/1]
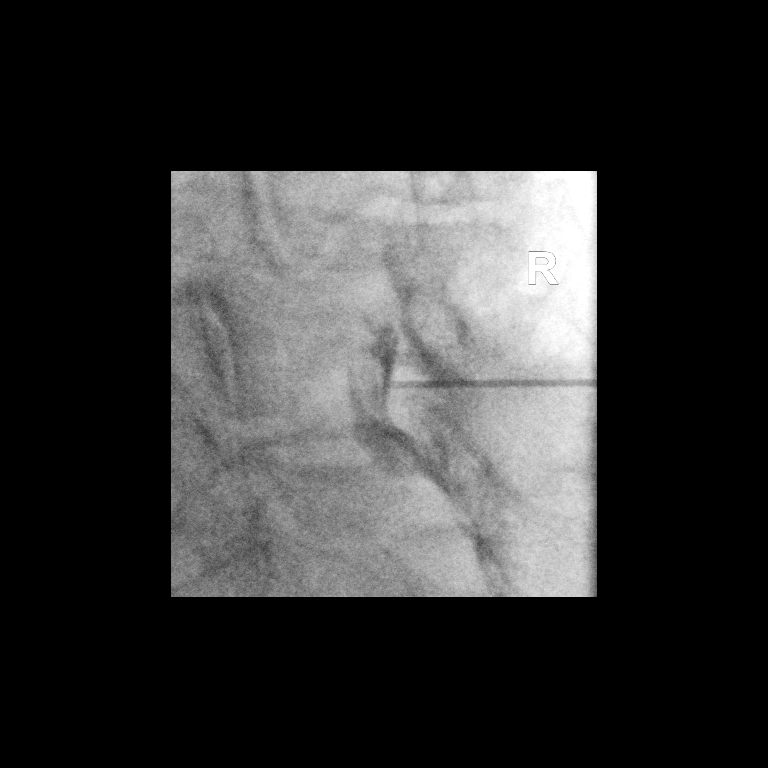

[Series 2: ortho adipose · 1 of 1 slices shown (2 of 2)]
[im 1/1]
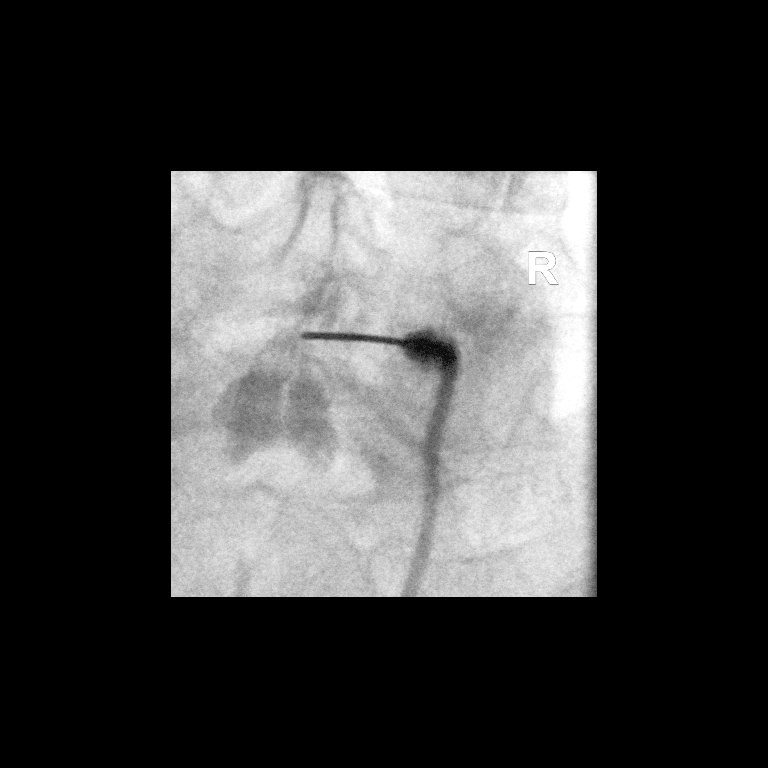

[2 of 2 positions shown; findings below may reference images not displayed]

FLUOROSCOPY TIME:  Radiation Exposure Index (as provided by the
fluoroscopic device): 2.7 mGy

Fluoroscopy Time:  16 seconds

Number of Acquired Images:  0

PROCEDURE:
The procedure, risks, benefits, and alternatives were explained to
the patient. Questions regarding the procedure were encouraged and
answered. The patient understands and consents to the procedure.

LUMBAR EPIDURAL INJECTION:

An interlaminar approach was performed on the right at L5-S1. The
overlying skin was cleansed and anesthetized. A 3.5 inch 20 gauge
epidural needle was advanced using loss-of-resistance technique.

DIAGNOSTIC EPIDURAL INJECTION:

Injection of Isovue-M 200 shows a good epidural pattern with spread
above and below the level of needle placement to both sides, more so
on the right. No vascular opacification is seen.

THERAPEUTIC EPIDURAL INJECTION:

120 mg of Depo-Medrol mixed with 3 mL of 1% lidocaine were
instilled. The procedure was well-tolerated, and the patient was
discharged thirty minutes following the injection in good condition.

COMPLICATIONS:
None immediate.
IMPRESSION: Technically successful interlaminar epidural injection on the right
at L5-S1.

## 2020-12-27 MED ORDER — IOPAMIDOL (ISOVUE-M 200) INJECTION 41%
1.0000 mL | Freq: Once | INTRAMUSCULAR | Status: AC
  Administered 2020-12-27: 1 mL via EPIDURAL

## 2020-12-27 MED ORDER — METHYLPREDNISOLONE ACETATE 40 MG/ML INJ SUSP (RADIOLOG
120.0000 mg | Freq: Once | INTRAMUSCULAR | Status: AC
  Administered 2020-12-27: 120 mg via EPIDURAL

## 2020-12-27 NOTE — Discharge Instructions (Signed)

## 2021-01-04 ENCOUNTER — Ambulatory Visit: Payer: Medicare HMO | Admitting: Family Medicine

## 2021-01-05 ENCOUNTER — Ambulatory Visit (HOSPITAL_COMMUNITY): Admission: RE | Admit: 2021-01-05 | Payer: Medicare HMO | Source: Ambulatory Visit

## 2021-01-09 ENCOUNTER — Ambulatory Visit (HOSPITAL_COMMUNITY): Admission: RE | Admit: 2021-01-09 | Payer: Medicare HMO | Source: Ambulatory Visit

## 2021-01-09 ENCOUNTER — Ambulatory Visit (INDEPENDENT_AMBULATORY_CARE_PROVIDER_SITE_OTHER): Payer: Medicare HMO | Admitting: Family Medicine

## 2021-01-09 ENCOUNTER — Other Ambulatory Visit: Payer: Self-pay

## 2021-01-09 ENCOUNTER — Encounter: Payer: Self-pay | Admitting: Family Medicine

## 2021-01-09 VITALS — BP 144/88 | Wt 162.0 lb

## 2021-01-09 DIAGNOSIS — M545 Low back pain, unspecified: Secondary | ICD-10-CM | POA: Diagnosis not present

## 2021-01-09 DIAGNOSIS — G8929 Other chronic pain: Secondary | ICD-10-CM | POA: Diagnosis not present

## 2021-01-09 NOTE — Patient Instructions (Addendum)
We will check on the neurosurgery referral - this is the next step for your low back too given you're not improving with the therapy, home exercises, injection. Reschedule the MRI the OBGyn physician ordered of your pelvis. Follow up with me as needed since you'll be following the neurosurgery now to discuss next steps.  [facility] [address] [place] [address] [place] [address] [phone] 01/17/21 Tuesday at 330p with Dr Dutch Quint

## 2021-01-09 NOTE — Progress Notes (Signed)
PCP: Norm Salt, PA  Subjective:   HPI: Patient is a 65 y.o. female here for low back pain.  9/15: Interpreter was present for today's visit. Patient reports she has had fairly severe low back pain with radiation into the right lower extremity for several weeks. She has known history of fibromyalgia but feels like this pain is different. Reports numbness and tingling down the right leg into the foot and the digits. No bowel or bladder dysfunction. No weakness. She is currently taking gabapentin. She had previously been on Cymbalta but states that she has stopped this.  Notes also indicate that at some point she was taking nortriptyline.  She is not certain if she is tried Lyrica. She states that this low back pain started after she suffered a fall in the bathtub. She is uncertain how she fell but states she just passed out and lost consciousness for a few minutes. Of concern she states that she had 3 episodes of sudden syncope last year and she does have known history of coronary artery disease but does not have a cardiologist currently. For the current pain she went to an outside facility on August 24 and was given prednisone 20 mg for a week and tizanidine which did not provide much relief. Then on September 8 she went to emergency department and was prescribed meloxicam and dexamethasone.  Is also seem to have not helped her very much.  10/13: Interpreter present for today's visit. Patient returns stating she feels about the same. Has done one visit of physical therapy so far. Doing some stretches at home and using tennis ball to roll on muscles of her back but feels this makes the pain worse Taking voltaren twice a day and has tried both baclofen and flexeril - feels the flexeril helps more Scheduled to have lumbar spine MRI on 10/29 - was moved back because she had a cardiac monitor. Taking ASA 81mg  now.  11/8: MRI results reviewed and discussed with patient and  interpreter.  Multilevel degenerative changes but only mild narrowing at multiple levels.  Discussed restarting physical therapy, increase in medications (gabapentin, cymbalta), consultation with neurosurgery for possible ESIs.  She will restart physical therapy.  Also noted a cystic lesion in pelvis - advised to follow-up with PCP about this - measures 2.8x1.7cm.  She reports having had hysterectomy and believes had oophorectomy as well.  She verbalized understanding regarding follow up with them.  Follow up with 13/8 in 6 weeks for her back.  11/09/20: Patient reports she feels the same compared to last visit. She did not restart physical therapy. She is taking lyrica, amitriptyline, voltaren, and baclofen. Pain right side of low back worse than left. Radiates into right lower extremity. She is scheduled to have cervical spine and brain MRIs tomorrow by neurology.  3/28: Patient reports lack of improvement in right sided low back pain into right lower extremity following ESI. She was referred to neurosurgery by neurology for severe cervical spinal stenosis but has not heard yet about appointment. Taking lyrica, amitriptyline, voltaren, baclofen. No new injuries.  Past Medical History:  Diagnosis Date  . Arthritis    Bil hands  . Bladder prolapse, female, acquired   . Breast mass    left  . Chronic headaches   . Coronary artery disease   . Fibromyalgia   . GERD (gastroesophageal reflux disease)   . Glaucoma   . Hyperlipemia   . Hypertension   . Imbalance   . Insomnia   .  Osteoporosis   . Paresthesia   . Prediabetes   . Vitamin D deficiency     Current Outpatient Medications on File Prior to Visit  Medication Sig Dispense Refill  . alendronate (FOSAMAX) 35 MG tablet Take 1 tablet (35 mg total) by mouth every 7 (seven) days. Take with a full glass of water on an empty stomach. 4 tablet 11  . amitriptyline (ELAVIL) 25 MG tablet Take 1 tablet (25 mg total) by mouth at bedtime. 90  tablet 3  . atorvastatin (LIPITOR) 20 MG tablet TAKE 1 TABLET BY MOUTH EVERY DAY 90 tablet 0  . baclofen (LIORESAL) 10 MG tablet Take 1 tablet (10 mg total) by mouth 3 (three) times daily as needed for muscle spasms. (Patient not taking: Reported on 11/30/2020) 60 each 1  . Cholecalciferol (VITAMIN D3 PO) Take by mouth daily.    . Cyanocobalamin (VITAMIN B-12 PO) Take by mouth daily.    . diclofenac (VOLTAREN) 75 MG EC tablet TAKE 1 TABLET BY MOUTH TWICE A DAY (Patient not taking: Reported on 11/30/2020) 60 tablet 1  . losartan-hydrochlorothiazide (HYZAAR) 50-12.5 MG tablet Take 1 tablet by mouth daily.    . Melatonin 10 MG CAPS at bedtime. (Patient not taking: Reported on 11/30/2020)    . metoprolol succinate (TOPROL-XL) 25 MG 24 hr tablet Take 25 mg by mouth daily.    Marland Kitchen omeprazole (PRILOSEC) 20 MG capsule TAKE 1 CAPSULE BY MOUTH EVERY DAY. Please make PCP appointment. 30 capsule 0  . pregabalin (LYRICA) 75 MG capsule Take 1 capsule (75 mg total) by mouth 2 (two) times daily. 60 capsule 3  . VITAMIN E PO Take by mouth daily.     Current Facility-Administered Medications on File Prior to Visit  Medication Dose Route Frequency Provider Last Rate Last Admin  . 0.9 %  sodium chloride infusion  500 mL Intravenous Once Meryl Dare, MD        Past Surgical History:  Procedure Laterality Date  . APPENDECTOMY    . ARTERY BIOPSY Right 09/16/2018   Procedure: BIOPSY TEMPORAL ARTERY RIGHT;  Surgeon: Maeola Harman, MD;  Location[facility] OR;  Service: Vascular;  Laterality: Right;  . BREAST BIOPSY     benign/left breast  . COLONOSCOPY  07/2018  . OVARIAN CYST SURGERY Right    in her 30s. cyst removal. ovary left  . TOTAL VAGINAL HYSTERECTOMY  2008   for prolapse. in [place]  . VAGINAL PROLAPSE REPAIR N/A 12/22/2019   Procedure: RECTOCELE REPAIR WITH GRAFT AND VAGINAL VAULT SUSPENSION/CYSTOSCOPY;  Surgeon: Alfredo Martinez, MD;  Location: [facility] ORS;  Service: Urology;  Laterality: N/A;     No Known Allergies  Social History   Socioeconomic History  . Marital status: Legally Separated    Spouse name: Maisie Fus  . Number of children: 2  . Years of education: 81  . Highest education level: Not on file  Occupational History  . Occupation: Retired  Tobacco Use  . Smoking status: Never Smoker  . Smokeless tobacco: Never Used  Vaping Use  . Vaping Use: Never used  Substance and Sexual Activity  . Alcohol use: Not Currently    Comment: SOCIAL  . Drug use: No  . Sexual activity: Yes    Birth control/protection: Surgical  Other Topics Concern  . Not on file  Social History Narrative   Lives at home alone   Caffeine use: 1 cup coffee in AM, sometimes 1 decaf cup in afternoon. Green tea.    Ambidextrous  Social Determinants of Health   Financial Resource Strain: Not on file  Food Insecurity: Not on file  Transportation Needs: Not on file  Physical Activity: Not on file  Stress: Not on file  Social Connections: Not on file  Intimate Partner Violence: Not on file    Family History  Problem Relation Age of Onset  . Hypertension Mother   . Hypertension Father   . Migraines Neg Hx     BP (!) 144/88   Wt 162 lb (73.5 kg)   BMI 28.70 kg/m   No flowsheet data found.  No flowsheet data found.  Review of Systems: See HPI above.     Objective:  Physical Exam:  Gen: NAD, comfortable in exam room  Back: No gross deformity, scoliosis. TTP right > left paraspinal lumbar region.  No midline or bony TTP. FROM. Strength LEs 5/5 all muscle groups.   Negative SLRs. Negative logrolls. Sensation intact to light touch bilaterally.  Assessment & Plan:  1. Low back pain with radiation into right lower extremity - multilevel degenerative changes on MRI of lumbar spine.  Trial of ESI on right at L5-S1 did not provide benefit.  She had MRI of cervical spine showing severe spinal stenosis at C6-7 - doubt this is related to her current back pain but she had not  heard from neurosurgery - appointment made there for evaluation.  Consider restarting physical therapy if amenable to this pending results from her neurosurgery visit.

## 2021-01-14 ENCOUNTER — Other Ambulatory Visit: Payer: Self-pay

## 2021-01-14 DIAGNOSIS — I83893 Varicose veins of bilateral lower extremities with other complications: Secondary | ICD-10-CM

## 2021-01-19 ENCOUNTER — Ambulatory Visit (HOSPITAL_COMMUNITY): Payer: Medicare HMO

## 2021-01-19 DIAGNOSIS — M4802 Spinal stenosis, cervical region: Secondary | ICD-10-CM | POA: Diagnosis not present

## 2021-01-20 ENCOUNTER — Other Ambulatory Visit: Payer: Self-pay

## 2021-01-20 ENCOUNTER — Ambulatory Visit (HOSPITAL_COMMUNITY)
Admission: RE | Admit: 2021-01-20 | Discharge: 2021-01-20 | Disposition: A | Discharge status: Home / Self Care | Payer: Medicare HMO | Source: Ambulatory Visit | Attending: Obstetrics and Gynecology | Admitting: Obstetrics and Gynecology

## 2021-01-20 DIAGNOSIS — N838 Other noninflammatory disorders of ovary, fallopian tube and broad ligament: Secondary | ICD-10-CM | POA: Diagnosis not present

## 2021-01-20 DIAGNOSIS — N949 Unspecified condition associated with female genital organs and menstrual cycle: Secondary | ICD-10-CM | POA: Insufficient documentation

## 2021-01-20 DIAGNOSIS — N83291 Other ovarian cyst, right side: Secondary | ICD-10-CM | POA: Diagnosis not present

## 2021-01-20 DIAGNOSIS — K5730 Diverticulosis of large intestine without perforation or abscess without bleeding: Secondary | ICD-10-CM | POA: Diagnosis not present

## 2021-01-20 DIAGNOSIS — M4696 Unspecified inflammatory spondylopathy, lumbar region: Secondary | ICD-10-CM | POA: Diagnosis not present

## 2021-01-20 IMAGING — MR MR PELVIS WO/W CM
9 of 16 series · 24 of 48 positions shown · IV contrast (gadavist)
Comparison: Lumbar MRI [DATE].  Ultrasound [DATE].

CLINICAL DATA: Cystic right adnexal lesion on lumbar MRI. No
corresponding abnormality identified on subsequent pelvic
ultrasound.

EXAM:
MRI PELVIS WITHOUT AND WITH CONTRAST
TECHNIQUE: Multiplanar multisequence MR imaging of the pelvis was performed
both before and after administration of intravenous contrast.
CONTRAST:  7.5mL GADAVIST GADOBUTROL 1 MMOL/ML IV SOLN

[Series 3: T2 · coronal · 5.0mm · 0.70mm/px · 1 of 36 slices shown (1 of 4)]
[im 1/36]
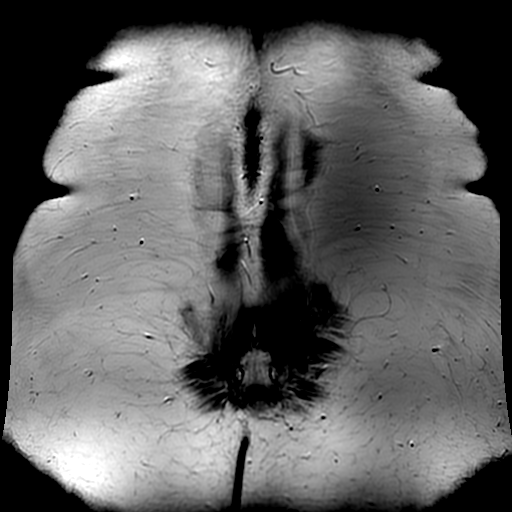

[Series 4: T2 · axial · 5.0mm · 0.51mm/px · 1 of 34 slices shown (2 of 4)]
[im 1/34]
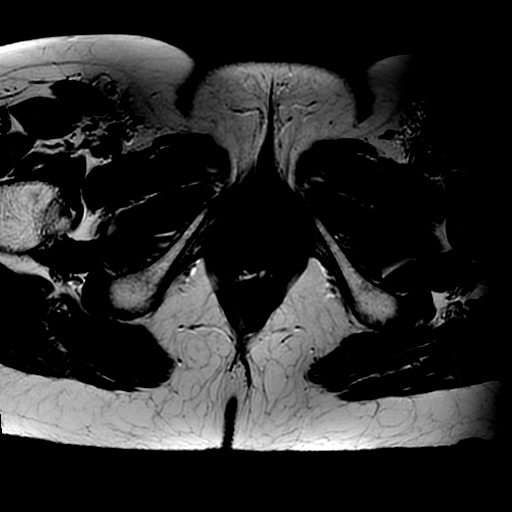

[Series 5: T2 fat-sat · axial · 5.0mm · 0.51mm/px · 1 of 34 slices shown]
[im 1/34]
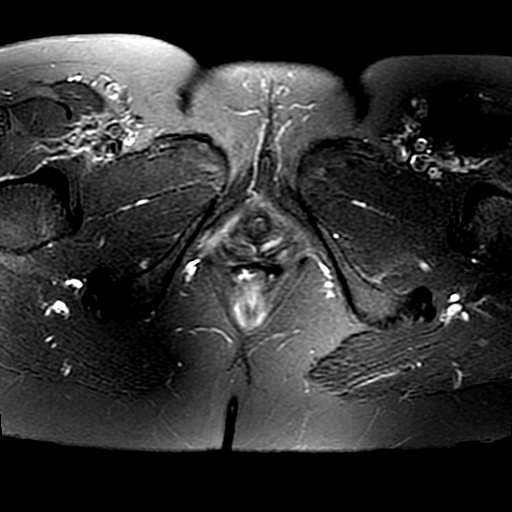

[Series 6: T2 · sagittal · 5.0mm · 0.51mm/px · 1 of 32 slices shown (3 of 4)]
[im 1/32]
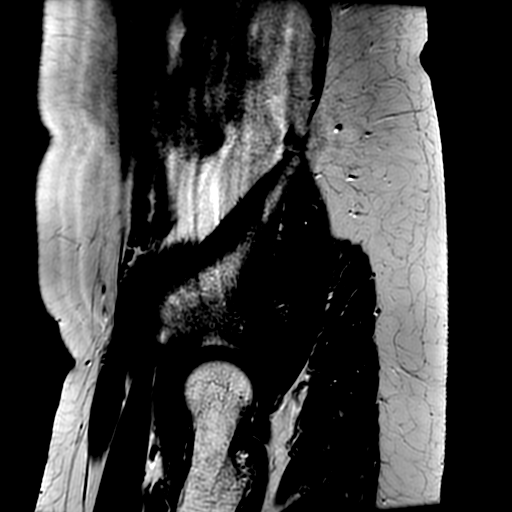

[Series 7: T2 · coronal · 4.0mm · 0.47mm/px · 1 of 36 slices shown (4 of 4)]
[im 1/36]
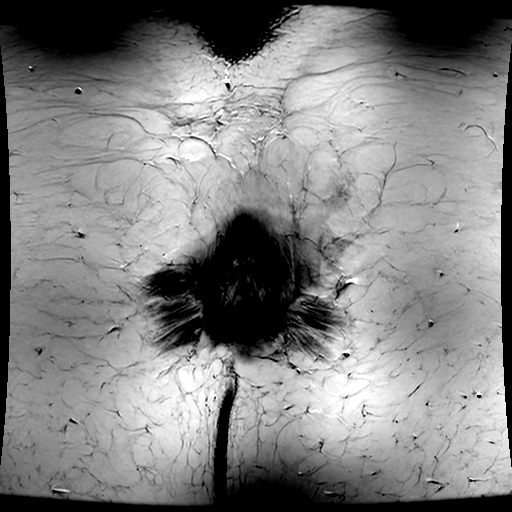

[Series 8: DWI b500 · axial · 8.0mm · 1.48mm/px · z∈[-26,+284]mm · 3 of 64 slices shown]
[im 1/64]
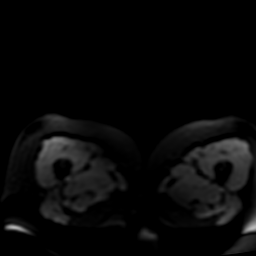
[im 32/64]
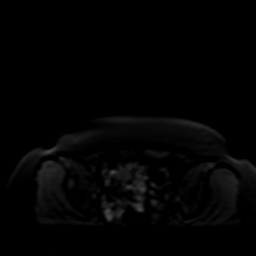
[im 64/64]
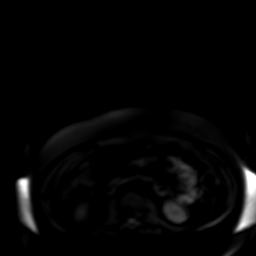

[Series 9: T1 dynamic · axial · 5.0mm · 1.02mm/px · z∈[+24,+271]mm · 5 of 100 slices shown (1 of 3)]
[im 1/100]
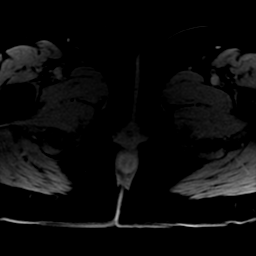
[im 25/100]
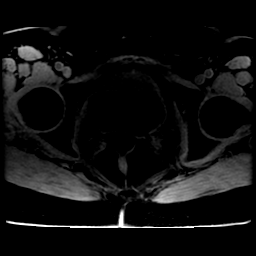
[im 50/100]
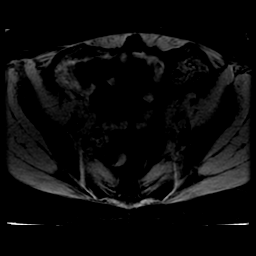
[im 75/100]
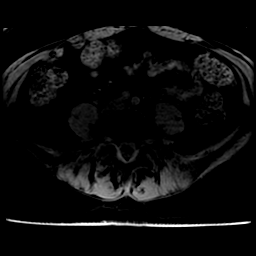
[im 100/100]
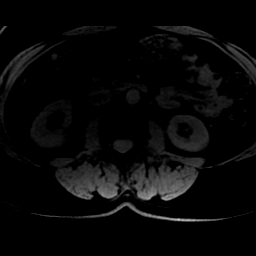

[Series 10: T1 dynamic · axial · 3.9mm · 0.47mm/px · z∈[+40,+190]mm · 4 of 76 slices shown (2 of 3)]
[im 1/76]
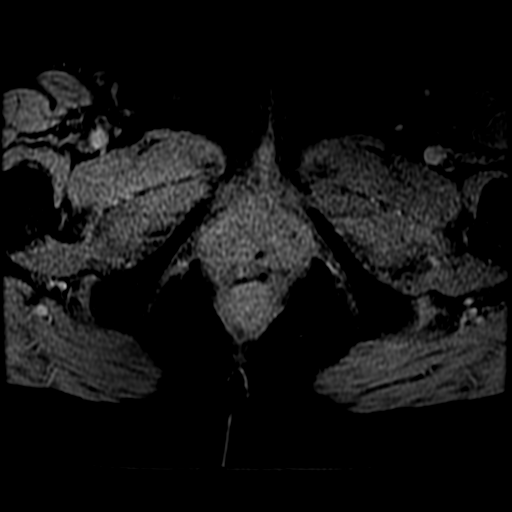
[im 26/76]
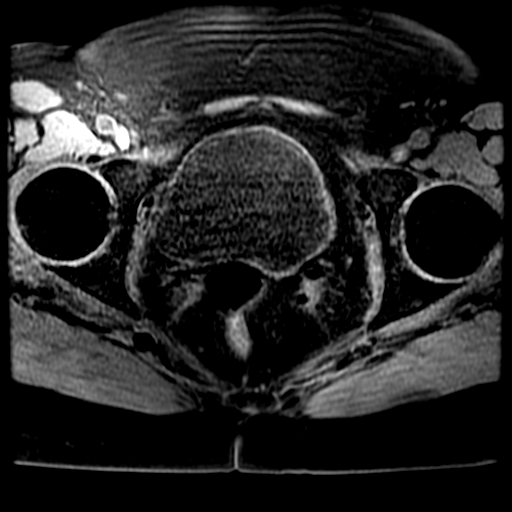
[im 51/76]
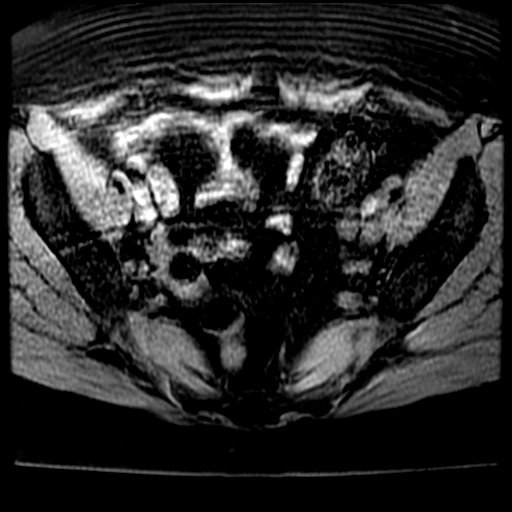
[im 76/76]
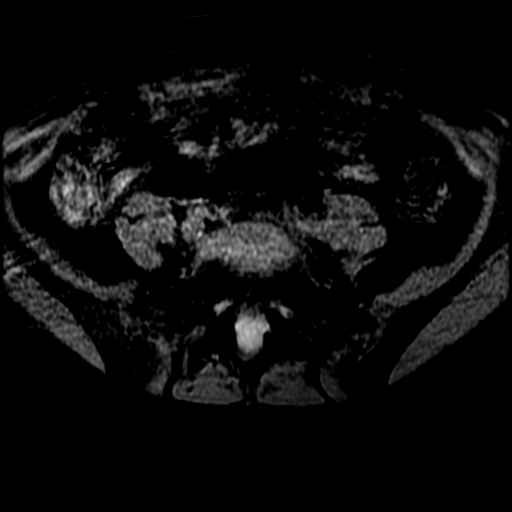

[Series 12: T1 dynamic · axial · 3.9mm · 0.47mm/px · z∈[+40,+190]mm · 7 of 152 slices shown (3 of 3)]
[im 1/152]
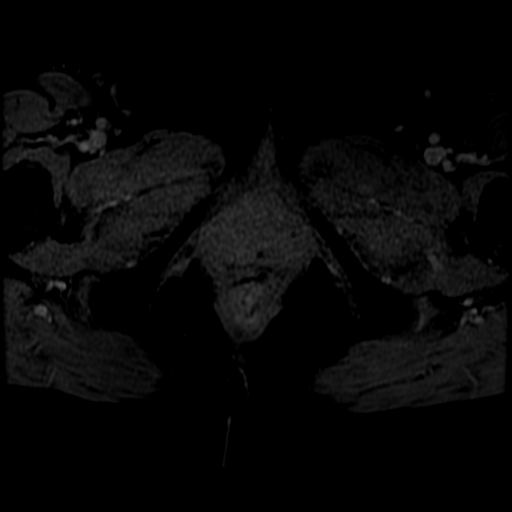
[im 26/152]
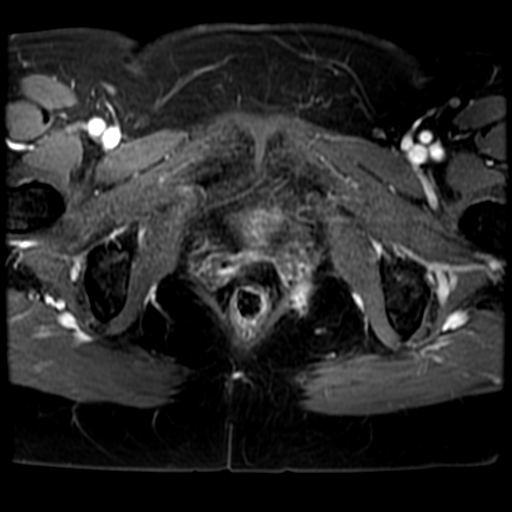
[im 51/152]
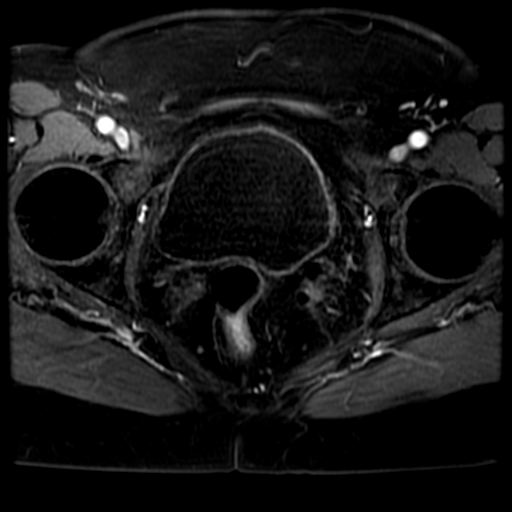
[im 76/152]
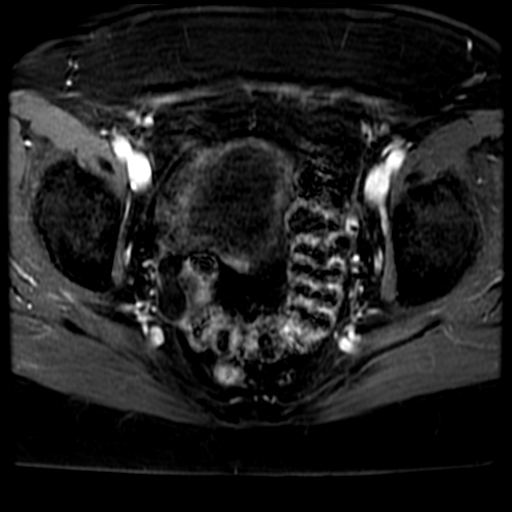
[im 101/152]
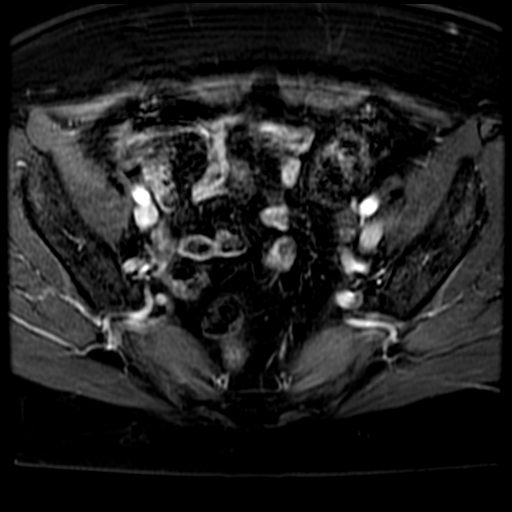
[im 126/152]
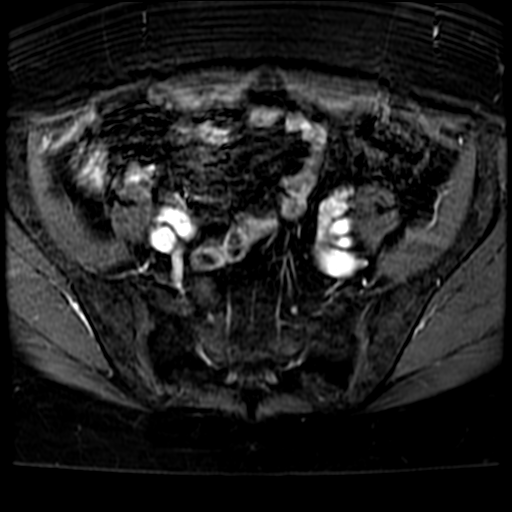
[im 152/152]
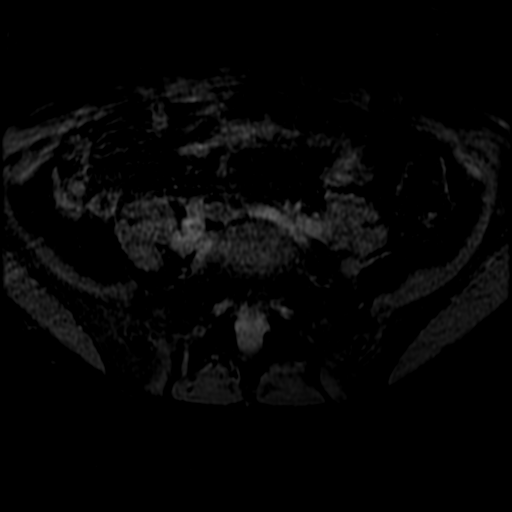

[24 of 48 positions shown; findings below may reference images not displayed]

FINDINGS: Urinary Tract: The visualized distal ureters and bladder appear
unremarkable.

Bowel: Sigmoid diverticulosis with mild wall thickening. No
surrounding inflammatory change or significant bowel distension.

Vascular/Lymphatic: No enlarged pelvic lymph nodes identified.
Vascular tortuosity without evidence of aneurysm.

Reproductive:

Uterus: Surgically absent.

Cervix/Vagina:  Probable pelvic floor laxity.  No evidence of mass.

Right ovary: There is a tubular cystic lesion in the right adnexa
which measures 3.5 x 2.8 x 1.5 cm. This demonstrates no soft tissue
components or abnormal enhancement, and appears similar to the
previous lumbar MRI. Appearance suggests a hydrosalpinx, especially
on sagittal series 15.

Left ovary:  Appears normal (image [DATE]).

Other: No pelvic ascites.

Musculoskeletal: No acute findings or focal osseous lesions. Lumbar
facet arthropathy.
IMPRESSION: 1. Tubular cystic structure in the right adnexa appears similar to
previous lumbar MRI from nearly 6 months ago and shows no soft
tissue components or abnormal enhancement. Appearance is most
consistent with a hydrosalpinx, with a complex cystic lesion much
less likely. Recommend follow-up US in 6-12 months. Note: This
recommendation does not apply to premenarchal patients and to those
with increased risk (genetic, family history, elevated tumor markers
or other high-risk factors) of ovarian cancer. Reference: JACR [SL]
2. Previous hysterectomy with probable pelvic floor laxity.

## 2021-01-20 MED ORDER — GADOBUTROL 1 MMOL/ML IV SOLN
7.5000 mL | Freq: Once | INTRAVENOUS | Status: AC | PRN
  Administered 2021-01-20: 7.5 mL via INTRAVENOUS

## 2021-01-23 ENCOUNTER — Ambulatory Visit: Payer: Medicare HMO | Admitting: Obstetrics and Gynecology

## 2021-01-23 DIAGNOSIS — E559 Vitamin D deficiency, unspecified: Secondary | ICD-10-CM | POA: Insufficient documentation

## 2021-01-23 DIAGNOSIS — I83813 Varicose veins of bilateral lower extremities with pain: Secondary | ICD-10-CM | POA: Insufficient documentation

## 2021-01-23 DIAGNOSIS — M810 Age-related osteoporosis without current pathological fracture: Secondary | ICD-10-CM | POA: Insufficient documentation

## 2021-01-23 DIAGNOSIS — E785 Hyperlipidemia, unspecified: Secondary | ICD-10-CM | POA: Insufficient documentation

## 2021-01-23 DIAGNOSIS — G4700 Insomnia, unspecified: Secondary | ICD-10-CM | POA: Insufficient documentation

## 2021-01-23 DIAGNOSIS — R7301 Impaired fasting glucose: Secondary | ICD-10-CM | POA: Insufficient documentation

## 2021-01-23 DIAGNOSIS — N8111 Cystocele, midline: Secondary | ICD-10-CM | POA: Insufficient documentation

## 2021-01-25 ENCOUNTER — Ambulatory Visit: Payer: Medicare HMO | Admitting: Obstetrics and Gynecology

## 2021-01-25 ENCOUNTER — Ambulatory Visit (INDEPENDENT_AMBULATORY_CARE_PROVIDER_SITE_OTHER): Payer: Medicare HMO | Admitting: Physician Assistant

## 2021-01-25 ENCOUNTER — Ambulatory Visit (HOSPITAL_COMMUNITY)
Admission: RE | Admit: 2021-01-25 | Discharge: 2021-01-25 | Disposition: A | Discharge status: Home / Self Care | Payer: Medicare HMO | Source: Ambulatory Visit | Attending: Vascular Surgery | Admitting: Vascular Surgery

## 2021-01-25 ENCOUNTER — Other Ambulatory Visit: Payer: Self-pay

## 2021-01-25 VITALS — BP 127/86 | HR 74 | Temp 98.0°F | Resp 20 | Ht 63.0 in | Wt 161.3 lb

## 2021-01-25 DIAGNOSIS — I83893 Varicose veins of bilateral lower extremities with other complications: Secondary | ICD-10-CM

## 2021-01-25 DIAGNOSIS — I872 Venous insufficiency (chronic) (peripheral): Secondary | ICD-10-CM

## 2021-01-25 NOTE — Progress Notes (Signed)
Requested by:  Norm Salt, PA [address] [place],  [place] [address]  Reason for consultation: edema and varicose veins    History of Present Illness   Amy Cline is a 65 y.o. (1956-09-08) female who presents for evaluation of BLE edema and varicose veins  Venous symptoms include: aching, heavy, tired, throbbing, and swelling of right more than left leg Onset/duration:  Progressive symptoms over last 2 years  Aggravating factors: sitting, standing Alleviating factors: elevation of legs in a recliner Compression:  Never used Pain medications:  none Previous vein procedures:  none History of DVT:  none  An interpreter was used during today's visit  Past Medical History:  Diagnosis Date  . Arthritis    Bil hands  . Bladder prolapse, female, acquired   . Breast mass    left  . Chronic headaches   . Coronary artery disease   . Fibromyalgia   . GERD (gastroesophageal reflux disease)   . Glaucoma   . Hyperlipemia   . Hypertension   . Imbalance   . Insomnia   . Osteoporosis   . Paresthesia   . Prediabetes   . Vitamin D deficiency     Past Surgical History:  Procedure Laterality Date  . APPENDECTOMY    . ARTERY BIOPSY Right 09/16/2018   Procedure: BIOPSY TEMPORAL ARTERY RIGHT;  Surgeon: Amy Harman, MD;  Location: [facility] OR;  Service: Vascular;  Laterality: Right;  . BREAST BIOPSY     benign/left breast  . COLONOSCOPY  07/2018  . OVARIAN CYST SURGERY Right    in her 30s. cyst removal. ovary left  . TOTAL VAGINAL HYSTERECTOMY  2008   for prolapse. in [place]  . VAGINAL PROLAPSE REPAIR N/A 12/22/2019   Procedure: RECTOCELE REPAIR WITH GRAFT AND VAGINAL VAULT SUSPENSION/CYSTOSCOPY;  Surgeon: Amy Martinez, MD;  Location: [facility] OR[facility];  Service: Urology;  Laterality: N/A;    Social History   Socioeconomic History  . Marital status: Legally Separated    Spouse name: Amy Cline  . Number of children: 2  . Years of education: 66  . Highest  education level: Not on file  Occupational History  . Occupation: Retired  Tobacco Use  . Smoking status: Never Smoker  . Smokeless tobacco: Never Used  Vaping Use  . Vaping Use: Never used  Substance and Sexual Activity  . Alcohol use: Not Currently    Comment: SOCIAL  . Drug use: No  . Sexual activity: Yes    Birth control/protection: Surgical  Other Topics Concern  . Not on file  Social History Narrative   Lives at home alone   Caffeine use: 1 cup coffee in AM, sometimes 1 decaf cup in afternoon. Green tea.    Ambidextrous   Social Determinants of Health   Financial Resource Strain: Not on file  Food Insecurity: Not on file  Transportation Needs: Not on file  Physical Activity: Not on file  Stress: Not on file  Social Connections: Not on file  Intimate Partner Violence: Not on file    Family History  Problem Relation Age of Onset  . Hypertension Mother   . Hypertension Father   . Migraines Neg Hx     Current Outpatient Medications  Medication Sig Dispense Refill  . alendronate (FOSAMAX) 35 MG tablet Take 1 tablet (35 mg total) by mouth every 7 (seven) days. Take with a full glass of water on an empty stomach. 4 tablet 11  . amitriptyline (ELAVIL) 25 MG tablet Take  1 tablet (25 mg total) by mouth at bedtime. 90 tablet 3  . atorvastatin (LIPITOR) 20 MG tablet TAKE 1 TABLET BY MOUTH EVERY DAY 90 tablet 0  . baclofen (LIORESAL) 10 MG tablet Take 1 tablet (10 mg total) by mouth 3 (three) times daily as needed for muscle spasms. 60 each 1  . Cholecalciferol (VITAMIN D3 PO) Take by mouth daily.    . Cyanocobalamin (VITAMIN B-12 PO) Take by mouth daily.    . diclofenac (VOLTAREN) 75 MG EC tablet TAKE 1 TABLET BY MOUTH TWICE A DAY 60 tablet 1  . losartan-hydrochlorothiazide (HYZAAR) 50-12.5 MG tablet Take 1 tablet by mouth daily.    . metoprolol succinate (TOPROL-XL) 25 MG 24 hr tablet Take 25 mg by mouth daily.    Marland Kitchen omeprazole (PRILOSEC) 20 MG capsule TAKE 1 CAPSULE BY  MOUTH EVERY DAY. Please make PCP appointment. 30 capsule 0  . pregabalin (LYRICA) 75 MG capsule Take 1 capsule (75 mg total) by mouth 2 (two) times daily. 60 capsule 3  . tiZANidine (ZANAFLEX) 4 MG tablet     . VITAMIN E PO Take by mouth daily.    . Melatonin 10 MG CAPS at bedtime. (Patient not taking: Reported on 01/25/2021)     Current Facility-Administered Medications  Medication Dose Route Frequency Provider Last Rate Last Admin  . 0.9 %  sodium chloride infusion  500 mL Intravenous Once Amy Dare, MD        No Known Allergies  REVIEW OF SYSTEMS (negative unless checked):   Cardiac:  []  Chest pain or chest pressure? []  Shortness of breath upon activity? []  Shortness of breath when lying flat? []  Irregular heart rhythm?  Vascular:  []  Pain in calf, thigh, or hip brought on by walking? []  Pain in feet at night that wakes you up from your sleep? []  Blood clot in your veins? []  Leg swelling?  Pulmonary:  []  Oxygen at home? []  Productive cough? []  Wheezing?  Neurologic:  []  Sudden weakness in arms or legs? []  Sudden numbness in arms or legs? []  Sudden onset of difficult speaking or slurred speech? []  Temporary loss of vision in one eye? []  Problems with dizziness?  Gastrointestinal:  []  Blood in stool? []  Vomited blood?  Genitourinary:  []  Burning when urinating? []  Blood in urine?  Psychiatric:  []  Major depression  Hematologic:  []  Bleeding problems? []  Problems with blood clotting?  Dermatologic:  []  Rashes or ulcers?  Constitutional:  []  Fever or chills?  Ear/Nose/Throat:  []  Change in hearing? []  Nose bleeds? []  Sore throat?  Musculoskeletal:  []  Back pain? []  Joint pain? []  Muscle pain?   Physical Examination     Vitals:   01/25/21 0943  BP: 127/86  Pulse: 74  Resp: 20  Temp: 98 F (36.7 C)  TempSrc: Temporal  SpO2: 98%  Weight: 161 lb 4.8 oz (73.2 kg)  Height: 5\' 3"  (1.6 m)   Body mass index is 28.57 kg/m.  General:   WDWN in NAD; vital signs documented above Gait: Not observed HENT: WNL, normocephalic Pulmonary: normal non-labored breathing Cardiac: regular HR Abdomen: soft, NT, no masses Skin: without rashes Vascular Exam/Pulses:  Right Left  Radial 2+ (normal) 2+ (normal)  DP 2+ (normal) 2+ (normal)   Extremities: with varicose veins R medial thigh and lower leg, without reticular veins, with edema R >L, without stasis pigmentation, without lipodermatosclerosis, without ulcers Musculoskeletal: no muscle wasting or atrophy  Neurologic: A&O X 3;  No focal weakness or paresthesias  are detected Psychiatric:  The pt has Normal affect.  Non-invasive Vascular Imaging   BLE Venous Insufficiency Duplex :   RLE:   Negative for DVT and SVT,    GSV reflux from saphenofemoral junction to distal thigh,  GSV diameter >22mm  Negative for SSV reflux,  deep venous reflux of CFV and mid femoral vein   LLE:  Negative for DVT and SVT,   GSV reflux at saphenofemoral junction and at the knee,   GSV diameter 3-58mm  Negative for SSV reflux,  deep venous reflux of CFV and mid femoral vein   Medical Decision Making   Mohogany Toppins is a 65 y.o. female who presents with: BLE chronic venous insufficiency and symptomatic varicose veins of RLE   Bilateral lower extremity venous reflux study demonstrates an incompetent right greater saphenous vein as well as deep venous reflux noted; left greater saphenous vein also incompetent however this vein is of a smaller diameter  Patient was measured for and prescribed thigh-high 20 to 30 mmHg of compression to be worn daily  Also recommended and demonstrated elevation of her legs above her heart  She should avoid prolonged sitting and standing; NSAIDs can be used for discomfort associated with veins  Patient will follow up for further evaluation in 3 months by Dr. Darrick Penna or Dr. Edilia Bo for possible laser ablation therapy   Amy Rutter, PA-C [facility] Office: [phone]  01/25/2021, 10:30 AM  Clinic MD: Edilia Bo

## 2021-02-18 ENCOUNTER — Other Ambulatory Visit: Payer: Self-pay | Admitting: Neurology

## 2021-02-18 DIAGNOSIS — R200 Anesthesia of skin: Secondary | ICD-10-CM

## 2021-02-18 DIAGNOSIS — R29898 Other symptoms and signs involving the musculoskeletal system: Secondary | ICD-10-CM

## 2021-02-18 DIAGNOSIS — M5416 Radiculopathy, lumbar region: Secondary | ICD-10-CM

## 2021-02-18 DIAGNOSIS — R202 Paresthesia of skin: Secondary | ICD-10-CM

## 2021-02-18 DIAGNOSIS — G43709 Chronic migraine without aura, not intractable, without status migrainosus: Secondary | ICD-10-CM

## 2021-02-18 DIAGNOSIS — G44229 Chronic tension-type headache, not intractable: Secondary | ICD-10-CM

## 2021-02-23 ENCOUNTER — Other Ambulatory Visit: Payer: Self-pay

## 2021-02-23 ENCOUNTER — Ambulatory Visit (INDEPENDENT_AMBULATORY_CARE_PROVIDER_SITE_OTHER): Payer: 59 | Admitting: Obstetrics and Gynecology

## 2021-02-23 VITALS — BP 130/84 | HR 79 | Ht 63.0 in | Wt 162.8 lb

## 2021-02-23 DIAGNOSIS — Z789 Other specified health status: Secondary | ICD-10-CM

## 2021-02-23 DIAGNOSIS — N9489 Other specified conditions associated with female genital organs and menstrual cycle: Secondary | ICD-10-CM | POA: Diagnosis not present

## 2021-02-23 NOTE — Progress Notes (Signed)
Obstetrics and Gynecology Visit Return Patient Evaluation  Appointment Date: 02/23/2021  Primary Care Provider: Kerby Cline Clinic: [facility]  Chief Complaint: f/u pelvic MRI  History of Present Illness:  Amy Cline is a 65 y.o. with above CC.  Patient initially seen by Dr. Penne Cline on 2/16 due to a, "2.8 x 1.7 cm partially imaged right hemipelvic cysticlesion may involve the right adnexa." This was seen on 08/12/20 MRI lumbar spine for w/u for her low back pain.  Exam negative with Dr. Penne Cline and she was set up for a pelvic u/s and follow up visit.  U/s was negative with no ovaries seen.  Patient states she had a hysterectomy that was done vaginally in [place] she was 71 and it was due to prolapse. She states that they told that they "took everything", but she isn't explicitly certain if they took her ovaries. She states she has tried to get records but has been unable to.   I saw her on 3/10 and ordered an MRI pelvis.  Interval History: 4/8 MRI showed a 3.5 x 2.8 x 1.5cm righ adnexa tubular cystic lesion suggesting a hydrosalpingx. Radiology recommended a 6-19m repeat test; I also talked to Gyn Onc and Dr. Pricilla Cline felt a 66m MRI is fine and if stable and no further s/s then no need to do serial MRIs.   Patient asymptomatic today   Review of Systems:  as noted in the History of Present Illness.  Patient Active Problem List   Diagnosis Date Noted  . Age-related osteoporosis without current pathological fracture 01/23/2021  . Hyperlipidemia 01/23/2021  . Impaired fasting glucose 01/23/2021  . Insomnia 01/23/2021  . Midline cystocele 01/23/2021  . Varicose veins of bilateral lower extremities with pain 01/23/2021  . Vitamin D deficiency 01/23/2021  . Spinal stenosis in cervical region 01/19/2021  . Language barrier 12/22/2020  . Adnexal mass 12/19/2020  . Lumbar radiculopathy 09/07/2020  . Right arm pain 03/26/2019  .  Chronic migraine without aura without status migrainosus, not intractable 11/18/2018  . Rectocele, female 08/11/2018  . Scalp tenderness 08/11/2018  . Atypical chest pain 07/17/2016  . Essential hypertension 03/29/2016  . Fibromyalgia 03/29/2016  . Gastroesophageal reflux disease without esophagitis 03/29/2016  . Chronic tension-type headache, not intractable 08/16/2015   Medications:  Amy Cline had no medications administered during this visit. Current Outpatient Medications  Medication Sig Dispense Refill  . alendronate (FOSAMAX) 35 MG tablet Take 1 tablet (35 mg total) by mouth every 7 (seven) days. Take with a full glass of water on an empty stomach. 4 tablet 11  . amitriptyline (ELAVIL) 25 MG tablet Take 1 tablet (25 mg total) by mouth at bedtime. 90 tablet 3  . atorvastatin (LIPITOR) 20 MG tablet TAKE 1 TABLET BY MOUTH EVERY DAY 90 tablet 0  . Cholecalciferol (VITAMIN D3 PO) Take by mouth daily.    . Cyanocobalamin (VITAMIN B-12 PO) Take by mouth daily.    Marland Kitchen losartan-hydrochlorothiazide (HYZAAR) 50-12.5 MG tablet Take 1 tablet by mouth daily.    . metoprolol succinate (TOPROL-XL) 25 MG 24 hr tablet Take 25 mg by mouth daily.    Marland Kitchen omeprazole (PRILOSEC) 20 MG capsule TAKE 1 CAPSULE BY MOUTH EVERY DAY. Please make PCP appointment. 30 capsule 0  . pregabalin (LYRICA) 75 MG capsule Take 1 capsule (75 mg total) by mouth 2 (two) times daily. 60 capsule 3  . VITAMIN E PO Take by mouth daily.    . baclofen (LIORESAL)  10 MG tablet Take 1 tablet (10 mg total) by mouth 3 (three) times daily as needed for muscle spasms. (Patient not taking: Reported on 02/23/2021) 60 each 1  . diclofenac (VOLTAREN) 75 MG EC tablet TAKE 1 TABLET BY MOUTH TWICE A DAY (Patient not taking: Reported on 02/23/2021) 60 tablet 1  . Melatonin 10 MG CAPS at bedtime. (Patient not taking: No sig reported)    . tiZANidine (ZANAFLEX) 4 MG tablet  (Patient not taking: Reported on 02/23/2021)     Current Facility-Administered  Medications  Medication Dose Route Frequency Provider Last Rate Last Admin  . 0.9 %  sodium chloride infusion  500 mL Intravenous Once Amy Dare, MD        Allergies: has No Known Allergies.  Physical Exam:  BP 130/84   Pulse 79   Ht 5\' 3"  (1.6 m)   Wt 162 lb 12.8 oz (73.8 kg)   BMI 28.84 kg/m  Body mass index is 28.84 kg/m. General appearance: Well nourished, well developed female in no acute distress.  Neuro/Psych:  Normal mood and affect.     Radiology: Narrative & Impression  CLINICAL DATA:  Cystic right adnexal lesion on lumbar MRI. No corresponding abnormality identified on subsequent pelvic ultrasound.  EXAM: MRI PELVIS WITHOUT AND WITH CONTRAST  TECHNIQUE: Multiplanar multisequence MR imaging of the pelvis was performed both before and after administration of intravenous contrast.  CONTRAST:  7.46mL GADAVIST GADOBUTROL 1 MMOL/ML IV SOLN  COMPARISON:  Lumbar MRI 08/12/2020.  Ultrasound 12/13/2020.  FINDINGS: Urinary Tract: The visualized distal ureters and bladder appear unremarkable.  Bowel: Sigmoid diverticulosis with mild wall thickening. No surrounding inflammatory change or significant bowel distension.  Vascular/Lymphatic: No enlarged pelvic lymph nodes identified. Vascular tortuosity without evidence of aneurysm.  Reproductive:  Uterus: Surgically absent.  Cervix/Vagina:  Probable pelvic floor laxity.  No evidence of mass.  Right ovary: There is a tubular cystic lesion in the right adnexa which measures 3.5 x 2.8 x 1.5 cm. This demonstrates no soft tissue components or abnormal enhancement, and appears similar to the previous lumbar MRI. Appearance suggests a hydrosalpinx, especially on sagittal series 15.  Left ovary:  Appears normal (image 15/4).  Other: No pelvic ascites.  Musculoskeletal: No acute findings or focal osseous lesions. Lumbar facet arthropathy.  IMPRESSION: 1. Tubular cystic structure in the right  adnexa appears similar to previous lumbar MRI from nearly 6 months ago and shows no soft tissue components or abnormal enhancement. Appearance is most consistent with a hydrosalpinx, with a complex cystic lesion much less likely. Recommend follow-up 02/12/2021 in 6-12 months. Note: This recommendation does not apply to premenarchal patients and to those with increased risk (genetic, family history, elevated tumor markers or other high-risk factors) of ovarian cancer. Reference: JACR 2020 Feb; 17(2):248-254 2. Previous hysterectomy with probable pelvic floor laxity.   Electronically Signed   By: 03-08-2001 M.D.   On: 01/23/2021 09:58    Assessment: pt doing well  Plan:  1. Adnexal mass I told her I agree with a repeat mri in 22m assuming her ca125 comes back normal, but I told her if she ever has any s/s before then to let 11m know beforehand - CA 125 - MR PELVIS W WO CONTRAST; Future  Interpreter used  RTC: 86m  11m MD Attending [facility] [employer])

## 2021-02-24 LAB — CA 125: Cancer Antigen (CA) 125: 10.7 U/mL (ref 0.0–38.1)

## 2021-05-04 ENCOUNTER — Encounter: Payer: Self-pay | Admitting: Vascular Surgery

## 2021-05-04 ENCOUNTER — Other Ambulatory Visit: Payer: Self-pay

## 2021-05-04 ENCOUNTER — Ambulatory Visit (INDEPENDENT_AMBULATORY_CARE_PROVIDER_SITE_OTHER): Payer: 59 | Admitting: Vascular Surgery

## 2021-05-04 VITALS — BP 130/86 | HR 85 | Temp 98.2°F | Resp 14 | Ht 63.0 in | Wt 162.0 lb

## 2021-05-04 DIAGNOSIS — I872 Venous insufficiency (chronic) (peripheral): Secondary | ICD-10-CM

## 2021-05-04 DIAGNOSIS — I83893 Varicose veins of bilateral lower extremities with other complications: Secondary | ICD-10-CM | POA: Diagnosis not present

## 2021-05-04 NOTE — Progress Notes (Signed)
REASON FOR VISIT:   Follow-up of chronic venous insufficiency  MEDICAL ISSUES:   CHRONIC VENOUS INSUFFICIENCY: This patient has CEAP C2 venous disease.  She is having significant symptoms related to her venous hypertension and is failed conservative treatment including leg elevation, thigh-high compression stockings, and exercise.  I think she would potentially benefit from laser ablation of the right great saphenous vein with a few stab phlebectomies in the calf.  I would likely cannulate the vein in the distal thigh.  I have discussed conservative measures for treatment of her venous disease including the importance of intermittent leg elevation and the proper positioning for this.  I have encouraged her to continue to wear her thigh-high compression stockings.  I have encouraged her to avoid prolonged sitting and standing.  We discussed the importance of exercise specifically walking and water aerobics.  I have discussed the indications for endovenous laser ablation of the right GSV, that is to lower the pressure in the veins and potentially help relieve the symptoms from venous hypertension.  I have also discussed alternative options such as conservative treatment as described above. I have discussed the potential complications of the procedure, including bleeding, bruising, leg swelling, deep venous thrombosis (<1% risk), or failure of the vein to close <1% risk).  I have also explained that venous insufficiency is a chronic disease, and that the patient is at risk for recurrent varicose veins in the future.  All of the patient's questions were encouraged and answered. They are agreeable to proceed.   HPI:   Amy Cline is a pleasant 65 y.o. female who was seen by Aggie Moats, PA on 01/25/2021 with edema and varicose veins.  This involved both lower extremities.  She was having aching pain and heaviness in the right leg more than the left.  The symptoms have been going on for about 2 years  and are gradually getting worse.  Her symptoms are worse with sitting and standing and alleviated with elevation.  She had not been wearing compression stockings.  She is had no previous history of DVT and no previous venous procedures.  On exam she had varicose veins in the medial right thigh and lower leg and some edema more so on the right than the left.  Her venous duplex scan showed significant venous reflux in the great saphenous vein on the right.  There was also some reflux in the deep system involving the common femoral vein and mid femoral vein.  She was encouraged to wear thigh-high compression stockings, elevate her legs, and exercise.  She returns for 61-month follow-up visit.  The history today is obtained using the translation app.  Patient continues to have significant aching pain and heaviness in the right leg which is aggravated by standing and sitting and relieved with elevation.  Her symptoms are worse at the end of the day.  She has been wearing her thigh-high compression stockings which helped some.  She has had no previous venous procedures no previous history of DVT.  Past Medical History:  Diagnosis Date   Arthritis    Bil hands   Bladder prolapse, female, acquired    Breast mass    left   Chronic headaches    Coronary artery disease    Fibromyalgia    GERD (gastroesophageal reflux disease)    Glaucoma    Hyperlipemia    Hypertension    Imbalance    Insomnia    Osteoporosis    Paresthesia    Prediabetes  Vitamin D deficiency     Family History  Problem Relation Age of Onset   Hypertension Mother    Hypertension Father    Migraines Neg Hx     SOCIAL HISTORY: Social History   Tobacco Use   Smoking status: Never   Smokeless tobacco: Never  Substance Use Topics   Alcohol use: Not Currently    Comment: SOCIAL    No Known Allergies  Current Outpatient Medications  Medication Sig Dispense Refill   alendronate (FOSAMAX) 35 MG tablet Take 1 tablet  (35 mg total) by mouth every 7 (seven) days. Take with a full glass of water on an empty stomach. 4 tablet 11   amitriptyline (ELAVIL) 25 MG tablet Take 1 tablet (25 mg total) by mouth at bedtime. 90 tablet 3   atorvastatin (LIPITOR) 20 MG tablet TAKE 1 TABLET BY MOUTH EVERY DAY 90 tablet 0   Cholecalciferol (VITAMIN D3 PO) Take by mouth daily.     Cyanocobalamin (VITAMIN B-12 PO) Take by mouth daily.     diclofenac (VOLTAREN) 75 MG EC tablet TAKE 1 TABLET BY MOUTH TWICE A DAY 60 tablet 1   losartan-hydrochlorothiazide (HYZAAR) 50-12.5 MG tablet Take 1 tablet by mouth daily.     Melatonin 10 MG CAPS at bedtime.     metoprolol succinate (TOPROL-XL) 25 MG 24 hr tablet Take 25 mg by mouth daily.     omeprazole (PRILOSEC) 20 MG capsule TAKE 1 CAPSULE BY MOUTH EVERY DAY. Please make PCP appointment. 30 capsule 0   pregabalin (LYRICA) 75 MG capsule Take 1 capsule (75 mg total) by mouth 2 (two) times daily. 60 capsule 3   tiZANidine (ZANAFLEX) 4 MG tablet      VITAMIN E PO Take by mouth daily.     baclofen (LIORESAL) 10 MG tablet Take 1 tablet (10 mg total) by mouth 3 (three) times daily as needed for muscle spasms. (Patient not taking: No sig reported) 60 each 1   Current Facility-Administered Medications  Medication Dose Route Frequency Provider Last Rate Last Admin   0.9 %  sodium chloride infusion  500 mL Intravenous Once Meryl Dare, MD        REVIEW OF SYSTEMS:  [X]  denotes positive finding, [ ]  denotes negative finding Cardiac  Comments:  Chest pain or chest pressure:    Shortness of breath upon exertion:    Short of breath when lying flat:    Irregular heart rhythm:        Vascular    Pain in calf, thigh, or hip brought on by ambulation:    Pain in feet at night that wakes you up from your sleep:     Blood clot in your veins:    Leg swelling:  x       Pulmonary    Oxygen at home:    Productive cough:     Wheezing:         Neurologic    Sudden weakness in arms or legs:      Sudden numbness in arms or legs:     Sudden onset of difficulty speaking or slurred speech:    Temporary loss of vision in one eye:     Problems with dizziness:         Gastrointestinal    Blood in stool:     Vomited blood:         Genitourinary    Burning when urinating:     Blood in urine:  Psychiatric    Major depression:         Hematologic    Bleeding problems:    Problems with blood clotting too easily:        Skin    Rashes or ulcers:        Constitutional    Fever or chills:     PHYSICAL EXAM:   Vitals:   05/04/21 1324  BP: 130/86  Pulse: 85  Resp: 14  Temp: 98.2 F (36.8 C)  TempSrc: Temporal  SpO2: 96%  Weight: 162 lb (73.5 kg)  Height: 5\' 3"  (1.6 m)   Body mass index is 28.7 kg/m.  GENERAL: The patient is a well-nourished female, in no acute distress. The vital signs are documented above. CARDIAC: There is a regular rate and rhythm.  VASCULAR: I do not detect carotid bruits. She has palpable pedal pulses. She has some dilated varicose veins in her medial right calf as documented below.  I looked at her right great saphenous vein myself with the SonoSite and she had does have reflux down to the distal thigh.  The vein has diameters ranging from 4.5 to 5 mm.  I would like to cannulate the vein in the distal thigh.  PULMONARY: There is good air exchange bilaterally without wheezing or rales. ABDOMEN: Soft and non-tender with normal pitched bowel sounds.  MUSCULOSKELETAL: There are no major deformities or cyanosis. NEUROLOGIC: No focal weakness or paresthesias are detected. SKIN: There are no ulcers or rashes noted. PSYCHIATRIC: The patient has a normal affect.  DATA:    VENOUS DUPLEX: I have reviewed the venous duplex scan that was done on 01/25/2021.  On the right side there is no evidence of DVT.  There is deep venous reflux involving the common femoral vein and femoral vein.  There is superficial venous reflux in the right great  saphenous vein from the saphenofemoral junction to the distal thigh.  Diameters of the vein ranged from 4.1-5.2 mm.  On the left side there is no evidence of DVT.  There is deep venous reflux involving the common femoral vein and femoral vein.  There is no significant superficial venous reflux.       01/27/2021 [facility] [phone]

## 2021-05-24 ENCOUNTER — Other Ambulatory Visit: Payer: Self-pay | Admitting: *Deleted

## 2021-05-24 DIAGNOSIS — I83811 Varicose veins of right lower extremities with pain: Secondary | ICD-10-CM

## 2021-06-15 DIAGNOSIS — I10 Essential (primary) hypertension: Secondary | ICD-10-CM | POA: Diagnosis not present

## 2021-06-15 DIAGNOSIS — M5442 Lumbago with sciatica, left side: Secondary | ICD-10-CM | POA: Diagnosis not present

## 2021-06-15 DIAGNOSIS — R002 Palpitations: Secondary | ICD-10-CM | POA: Diagnosis not present

## 2021-06-15 DIAGNOSIS — E559 Vitamin D deficiency, unspecified: Secondary | ICD-10-CM | POA: Diagnosis not present

## 2021-06-15 DIAGNOSIS — E785 Hyperlipidemia, unspecified: Secondary | ICD-10-CM | POA: Diagnosis not present

## 2021-06-15 DIAGNOSIS — K219 Gastro-esophageal reflux disease without esophagitis: Secondary | ICD-10-CM | POA: Diagnosis not present

## 2021-06-15 DIAGNOSIS — R7303 Prediabetes: Secondary | ICD-10-CM | POA: Diagnosis not present

## 2021-06-15 DIAGNOSIS — M797 Fibromyalgia: Secondary | ICD-10-CM | POA: Diagnosis not present

## 2021-07-03 DIAGNOSIS — E559 Vitamin D deficiency, unspecified: Secondary | ICD-10-CM | POA: Diagnosis not present

## 2021-07-03 DIAGNOSIS — M5442 Lumbago with sciatica, left side: Secondary | ICD-10-CM | POA: Diagnosis not present

## 2021-07-03 DIAGNOSIS — E785 Hyperlipidemia, unspecified: Secondary | ICD-10-CM | POA: Diagnosis not present

## 2021-07-03 DIAGNOSIS — R002 Palpitations: Secondary | ICD-10-CM | POA: Diagnosis not present

## 2021-07-03 DIAGNOSIS — M797 Fibromyalgia: Secondary | ICD-10-CM | POA: Diagnosis not present

## 2021-07-03 DIAGNOSIS — I10 Essential (primary) hypertension: Secondary | ICD-10-CM | POA: Diagnosis not present

## 2021-07-03 DIAGNOSIS — R7303 Prediabetes: Secondary | ICD-10-CM | POA: Diagnosis not present

## 2021-07-03 DIAGNOSIS — K219 Gastro-esophageal reflux disease without esophagitis: Secondary | ICD-10-CM | POA: Diagnosis not present

## 2021-07-10 DIAGNOSIS — M545 Low back pain, unspecified: Secondary | ICD-10-CM | POA: Diagnosis not present

## 2021-07-10 DIAGNOSIS — S139XXA Sprain of joints and ligaments of unspecified parts of neck, initial encounter: Secondary | ICD-10-CM | POA: Diagnosis not present

## 2021-07-19 ENCOUNTER — Other Ambulatory Visit: Payer: Self-pay | Admitting: Vascular Surgery

## 2021-07-19 MED ORDER — LORAZEPAM 1 MG PO TABS: 1.0000 mg | ORAL_TABLET | Freq: Once | ORAL | 0 refills | Status: AC

## 2021-07-19 NOTE — Progress Notes (Unsigned)
Ativan 1 mg 2 tablets no refills take 1 tablet 30 minutes before leaving house on the day of the procedure.  Bring second tablet with you to the office.  

## 2021-08-02 ENCOUNTER — Other Ambulatory Visit: Payer: Self-pay

## 2021-08-02 ENCOUNTER — Ambulatory Visit (INDEPENDENT_AMBULATORY_CARE_PROVIDER_SITE_OTHER): Payer: Medicare Other | Admitting: Vascular Surgery

## 2021-08-02 ENCOUNTER — Encounter: Payer: Self-pay | Admitting: Vascular Surgery

## 2021-08-02 VITALS — BP 112/75 | HR 80 | Temp 97.9°F | Resp 14 | Ht 63.0 in | Wt 155.0 lb

## 2021-08-02 DIAGNOSIS — I83811 Varicose veins of right lower extremities with pain: Secondary | ICD-10-CM | POA: Diagnosis not present

## 2021-08-02 NOTE — Progress Notes (Signed)
Patient name: Amy Cline MRN: [id number] DOB: Apr 22, 1956 Sex: female  REASON FOR VISIT: For laser ablation of the right great saphenous vein.  HPI: Amy Cline is a 65 y.o. female who presents for laser ablation of the right great saphenous vein.  I saw her on 05/04/2021.  She has CEAP C2 venous disease.  She had failed conservative treatment and I felt she was a candidate for laser ablation of the right great saphenous vein with a few stab phlebectomies.   Current Outpatient Medications  Medication Sig Dispense Refill   alendronate (FOSAMAX) 35 MG tablet Take 1 tablet (35 mg total) by mouth every 7 (seven) days. Take with a full glass of water on an empty stomach. 4 tablet 11   amitriptyline (ELAVIL) 25 MG tablet Take 1 tablet (25 mg total) by mouth at bedtime. 90 tablet 3   atorvastatin (LIPITOR) 20 MG tablet TAKE 1 TABLET BY MOUTH EVERY DAY 90 tablet 0   Cholecalciferol (VITAMIN D3 PO) Take by mouth daily.     Cyanocobalamin (VITAMIN B-12 PO) Take by mouth daily.     diclofenac (VOLTAREN) 75 MG EC tablet TAKE 1 TABLET BY MOUTH TWICE A DAY 60 tablet 1   losartan-hydrochlorothiazide (HYZAAR) 50-12.5 MG tablet Take 1 tablet by mouth daily.     Melatonin 10 MG CAPS at bedtime.     metoprolol succinate (TOPROL-XL) 25 MG 24 hr tablet Take 25 mg by mouth daily.     omeprazole (PRILOSEC) 20 MG capsule TAKE 1 CAPSULE BY MOUTH EVERY DAY. Please make PCP appointment. 30 capsule 0   pregabalin (LYRICA) 75 MG capsule Take 1 capsule (75 mg total) by mouth 2 (two) times daily. 60 capsule 3   tiZANidine (ZANAFLEX) 4 MG tablet      VITAMIN E PO Take by mouth daily.     baclofen (LIORESAL) 10 MG tablet Take 1 tablet (10 mg total) by mouth 3 (three) times daily as needed for muscle spasms. (Patient not taking: No sig reported) 60 each 1   Current Facility-Administered Medications  Medication Dose Route Frequency Provider Last Rate Last Admin   0.9 %  sodium chloride infusion  500 mL Intravenous Once  Meryl Dare, MD        PHYSICAL EXAM: Vitals:   08/02/21 1043  BP: 112/75  Pulse: 80  Resp: 14  Temp: 97.9 F (36.6 C)  TempSrc: Temporal  SpO2: (!) 0%  Weight: 155 lb (70.3 kg)  Height: 5\' 3"  (1.6 m)    PROCEDURE: Laser ablation right great saphenous vein  TECHNIQUE: The patient was taken to the exam room and the patient standing I marked several large dilated varicose veins.  Next the patient was placed supine.  I looked at the right great saphenous vein myself with the SonoSite and felt that I could cannulate this in the distal thigh.  The right leg was prepped and draped in usual sterile fashion.  Under ultrasound guidance, after the skin was anesthetized, I cannulated the right great saphenous vein in the distal thigh with a micropuncture needle and a micropuncture sheath was introduced over the wire.  I then advanced the J-wire to just below the saphenofemoral junction.  I then advanced the sheath and then remove the dilator and wire.  The laser fiber was positioned at the end of the sheath and the sheath was retracted.  Tumescent anesthesia was administered circumferentially around the vein.  Of note I position the laser fiber approximately 2.5 cm distal to the  saphenofemoral junction.  The patient was placed in Trendelenburg.  Laser ablation was performed the right great saphenous vein from 2-1/2 cm distal to the saphenofemoral junction to the distal thigh.  Next the areas that have been marked with varicose veins were anesthetized with tumescent anesthesia.  Approximately 5 small stab incisions were made the vein was grasped with a Coker and then bluntly excised.  Pressure was held for hemostasis.  Steri-Strips were applied.  A pressure dressing was applied.  The patient tolerated the procedure well and will return next week for follow-up duplex.  Waverly Ferrari [facility] [place] [phone]

## 2021-08-02 NOTE — Progress Notes (Signed)
     Laser Ablation Procedure    Date: 08/02/2021   Amy Cline DOB:[birth year]  Consent signed: Yes      Surgeon: Cari Caraway  MD  Procedure: Laser Ablation: right Greater Saphenous Vein  BP 112/75 (BP Location: Left Arm, Patient Position: Sitting, Cuff Size: Normal)   Pulse 80   Temp 97.9 F (36.6 C) (Temporal)   Resp 14   Ht 5\' 3"  (1.6 m)   Wt 155 lb (70.3 kg)   SpO2 (!) 0%   BMI 27.46 kg/m   Tumescent Anesthesia: 350 cc 0.9% NaCl with 50 cc Lidocaine HCL 1%  and 15 cc 8.4% NaHCO3  Local Anesthesia: 3 cc Lidocaine HCL and NaHCO3 (ratio 2:1)  7 watts continuous mode     Total energy: 1102 joules    Total time: 157 seconds Treatment Length   Laser Fiber Ref. #                              Lot # [id number]   Stab Phlebectomy: <10 Sites: Calf  Patient tolerated procedure well  Notes: Patient wore face mask.  All staff members wore facial masks and facial shields/goggles.    Patient took orally 1mg  Ativan 9:00 am orally Description of Procedure:  After marking the course of the secondary varicosities, the patient was placed on the operating table in the supine position, and the right leg was prepped and draped in sterile fashion.   Local anesthetic was administered and under ultrasound guidance the saphenous vein was accessed with a micro needle and guide wire; then the mirco puncture sheath was placed.  A guide wire was inserted saphenofemoral junction , followed by a 5 french sheath.  The position of the sheath and then the laser fiber below the junction was confirmed using the ultrasound.  Tumescent anesthesia was administered along the course of the saphenous vein using ultrasound guidance. The patient was placed in Trendelenburg position and protective laser glasses were placed on patient and staff, and the laser was fired at 7 watts continuous mode for a total of 1102 joules.   For stab phlebectomies, local anesthetic was administered at the previously  marked varicosities, and tumescent anesthesia was administered around the vessels.  Ten to 20 stab wounds were made using the tip of an 11 blade. And using the vein hook, the phlebectomies were performed using a hemostat to avulse the varicosities.  Adequate hemostasis was achieved.     Steri strips were applied to the stab wounds and ABD pads and thigh high compression stockings were applied.  Ace wrap bandages were applied over the phlebectomy sites and at the top of the saphenofemoral junction. Blood loss was less than 15 cc.  Discharge instructions reviewed with patient and hardcopy of discharge instructions given to patient to take home. The patient ambulated out of the operating room having tolerated the procedure well.

## 2021-08-03 ENCOUNTER — Other Ambulatory Visit: Payer: Self-pay | Admitting: Neurology

## 2021-08-03 DIAGNOSIS — R200 Anesthesia of skin: Secondary | ICD-10-CM

## 2021-08-03 DIAGNOSIS — R29898 Other symptoms and signs involving the musculoskeletal system: Secondary | ICD-10-CM

## 2021-08-03 DIAGNOSIS — M5416 Radiculopathy, lumbar region: Secondary | ICD-10-CM

## 2021-08-03 DIAGNOSIS — G43709 Chronic migraine without aura, not intractable, without status migrainosus: Secondary | ICD-10-CM

## 2021-08-03 DIAGNOSIS — R202 Paresthesia of skin: Secondary | ICD-10-CM

## 2021-08-03 DIAGNOSIS — G44229 Chronic tension-type headache, not intractable: Secondary | ICD-10-CM

## 2021-08-04 ENCOUNTER — Telehealth: Payer: Self-pay

## 2021-08-04 ENCOUNTER — Other Ambulatory Visit: Payer: Self-pay

## 2021-08-04 NOTE — Telephone Encounter (Signed)
Patient left VM asking for a return call. Using a Spanish interpreter I called back and left a message for a return call.

## 2021-08-04 NOTE — Telephone Encounter (Signed)
Patient calls to request information about removing the wraps on her legs. Advised her they could be removed after 48 hours and to continue to wear the compression socks for at least 2 weeks. Patient verbalizes understanding - instructed to call back if further questions.

## 2021-08-09 ENCOUNTER — Ambulatory Visit (HOSPITAL_COMMUNITY)
Admission: RE | Admit: 2021-08-09 | Discharge: 2021-08-09 | Disposition: A | Discharge status: Home / Self Care | Payer: Medicare Other | Source: Ambulatory Visit | Attending: Vascular Surgery | Admitting: Vascular Surgery

## 2021-08-09 ENCOUNTER — Ambulatory Visit (INDEPENDENT_AMBULATORY_CARE_PROVIDER_SITE_OTHER): Payer: Medicare Other | Admitting: Vascular Surgery

## 2021-08-09 ENCOUNTER — Encounter: Payer: Self-pay | Admitting: Vascular Surgery

## 2021-08-09 ENCOUNTER — Other Ambulatory Visit: Payer: Self-pay

## 2021-08-09 VITALS — BP 129/86 | HR 83 | Temp 98.3°F | Resp 14 | Ht 63.0 in | Wt 163.0 lb

## 2021-08-09 DIAGNOSIS — I83811 Varicose veins of right lower extremities with pain: Secondary | ICD-10-CM | POA: Diagnosis not present

## 2021-08-09 NOTE — Progress Notes (Signed)
Patient name: Amy Cline MRN: [id number] DOB: 1956-05-10 Sex: female  REASON FOR VISIT: Follow-up after laser ablation of the right great saphenous vein.  HPI: Amy Cline is a 65 y.o. female with CEAP C2 venous disease.  She had failed conservative treatment and was felt to be a candidate for laser ablation of the right great saphenous vein.  She underwent laser ablation of the right great saphenous vein on 08/02/2021.  I treated the vein from 2-1/2 cm distal to the saphenofemoral junction to the distal thigh.  Since I saw her last she has some mild aching pain where the vein was ablated.  She has no other specific complaints.  She denies chest pain or shortness of breath.  She has been gradually resuming her normal activities.  She has been wearing her thigh-high compression stockings.  Current Outpatient Medications  Medication Sig Dispense Refill   alendronate (FOSAMAX) 35 MG tablet Take 1 tablet (35 mg total) by mouth every 7 (seven) days. Take with a full glass of water on an empty stomach. 4 tablet 11   amitriptyline (ELAVIL) 25 MG tablet Take 1 tablet (25 mg total) by mouth at bedtime. Appointment needed for further refills or have primary care refill. 90 tablet 0   atorvastatin (LIPITOR) 20 MG tablet TAKE 1 TABLET BY MOUTH EVERY DAY 90 tablet 0   Cholecalciferol (VITAMIN D3 PO) Take by mouth daily.     Cyanocobalamin (VITAMIN B-12 PO) Take by mouth daily.     diclofenac (VOLTAREN) 75 MG EC tablet TAKE 1 TABLET BY MOUTH TWICE A DAY 60 tablet 1   losartan-hydrochlorothiazide (HYZAAR) 50-12.5 MG tablet Take 1 tablet by mouth daily.     Melatonin 10 MG CAPS at bedtime.     metoprolol succinate (TOPROL-XL) 25 MG 24 hr tablet Take 25 mg by mouth daily.     omeprazole (PRILOSEC) 20 MG capsule TAKE 1 CAPSULE BY MOUTH EVERY DAY. Please make PCP appointment. 30 capsule 0   pregabalin (LYRICA) 75 MG capsule Take 1 capsule (75 mg total) by mouth 2 (two) times daily. 60 capsule 3   tiZANidine  (ZANAFLEX) 4 MG tablet      VITAMIN E PO Take by mouth daily.     baclofen (LIORESAL) 10 MG tablet Take 1 tablet (10 mg total) by mouth 3 (three) times daily as needed for muscle spasms. (Patient not taking: No sig reported) 60 each 1   Current Facility-Administered Medications  Medication Dose Route Frequency Provider Last Rate Last Admin   0.9 %  sodium chloride infusion  500 mL Intravenous Once Meryl Dare, MD       REVIEW OF SYSTEMS: Arly.Keller ] denotes positive finding; [  ] denotes negative finding  CARDIOVASCULAR:  [ ]  chest pain   [ ]  dyspnea on exertion  [ ]  leg swelling  CONSTITUTIONAL:  [ ]  fever   [ ]  chills  PHYSICAL EXAM: Vitals:   08/09/21 1047  BP: 129/86  Pulse: 83  Resp: 14  Temp: 98.3 F (36.8 C)  TempSrc: Temporal  SpO2: 96%  Weight: 163 lb (73.9 kg)  Height: 5\' 3"  (1.6 m)   GENERAL: The patient is a well-nourished female, in no acute distress. The vital signs are documented above. CARDIOVASCULAR: There is a regular rate and rhythm. PULMONARY: There is good air exchange bilaterally without wheezing or rales. VASCULAR: She has no significant right leg swelling.  She has mild bruising along the medial thigh and calf.  DATA:  VENOUS DUPLEX:  I have independently interpreted her venous duplex scan today.  There is no evidence of DVT in the right lower extremity.  The right great saphenous vein is successfully closed from 1 cm distal to the saphenofemoral junction to the distal thigh.  MEDICAL ISSUES:  S/P LASER ABLATION RIGHT GREAT SAPHENOUS VEIN: The patient is undergone successful laser ablation of the right great saphenous vein.  She has 1 more week with her thigh-high compression stockings.  I encouraged her to elevate her legs.  We will see her back as needed.  Waverly [facility] [phone]

## 2021-08-31 ENCOUNTER — Ambulatory Visit (HOSPITAL_COMMUNITY): Admission: RE | Admit: 2021-08-31 | Payer: Medicare Other | Source: Ambulatory Visit

## 2021-10-23 DIAGNOSIS — L309 Dermatitis, unspecified: Secondary | ICD-10-CM | POA: Diagnosis not present

## 2021-10-23 DIAGNOSIS — M797 Fibromyalgia: Secondary | ICD-10-CM | POA: Diagnosis not present

## 2021-10-23 DIAGNOSIS — Z0000 Encounter for general adult medical examination without abnormal findings: Secondary | ICD-10-CM | POA: Diagnosis not present

## 2021-10-23 DIAGNOSIS — Z131 Encounter for screening for diabetes mellitus: Secondary | ICD-10-CM | POA: Diagnosis not present

## 2021-10-23 DIAGNOSIS — I10 Essential (primary) hypertension: Secondary | ICD-10-CM | POA: Diagnosis not present

## 2021-10-23 DIAGNOSIS — R7303 Prediabetes: Secondary | ICD-10-CM | POA: Diagnosis not present

## 2021-10-23 DIAGNOSIS — R69 Illness, unspecified: Secondary | ICD-10-CM | POA: Diagnosis not present

## 2021-10-23 DIAGNOSIS — E785 Hyperlipidemia, unspecified: Secondary | ICD-10-CM | POA: Diagnosis not present

## 2021-10-23 DIAGNOSIS — E559 Vitamin D deficiency, unspecified: Secondary | ICD-10-CM | POA: Diagnosis not present

## 2021-10-26 DIAGNOSIS — H0102B Squamous blepharitis left eye, upper and lower eyelids: Secondary | ICD-10-CM | POA: Diagnosis not present

## 2021-10-26 DIAGNOSIS — H524 Presbyopia: Secondary | ICD-10-CM | POA: Diagnosis not present

## 2021-10-26 DIAGNOSIS — H2513 Age-related nuclear cataract, bilateral: Secondary | ICD-10-CM | POA: Diagnosis not present

## 2021-10-26 DIAGNOSIS — H40013 Open angle with borderline findings, low risk, bilateral: Secondary | ICD-10-CM | POA: Diagnosis not present

## 2021-10-26 DIAGNOSIS — R69 Illness, unspecified: Secondary | ICD-10-CM | POA: Diagnosis not present

## 2021-10-26 DIAGNOSIS — H5203 Hypermetropia, bilateral: Secondary | ICD-10-CM | POA: Diagnosis not present

## 2021-10-26 DIAGNOSIS — H0102A Squamous blepharitis right eye, upper and lower eyelids: Secondary | ICD-10-CM | POA: Diagnosis not present

## 2021-10-26 DIAGNOSIS — H35033 Hypertensive retinopathy, bilateral: Secondary | ICD-10-CM | POA: Diagnosis not present

## 2021-10-26 DIAGNOSIS — H52223 Regular astigmatism, bilateral: Secondary | ICD-10-CM | POA: Diagnosis not present

## 2021-11-14 ENCOUNTER — Other Ambulatory Visit: Payer: Self-pay | Admitting: Neurology

## 2021-11-14 DIAGNOSIS — R200 Anesthesia of skin: Secondary | ICD-10-CM

## 2021-11-14 DIAGNOSIS — Z1231 Encounter for screening mammogram for malignant neoplasm of breast: Secondary | ICD-10-CM | POA: Diagnosis not present

## 2021-11-14 DIAGNOSIS — G43709 Chronic migraine without aura, not intractable, without status migrainosus: Secondary | ICD-10-CM

## 2021-11-14 DIAGNOSIS — M5416 Radiculopathy, lumbar region: Secondary | ICD-10-CM

## 2021-11-14 DIAGNOSIS — G44229 Chronic tension-type headache, not intractable: Secondary | ICD-10-CM

## 2021-11-14 DIAGNOSIS — R29898 Other symptoms and signs involving the musculoskeletal system: Secondary | ICD-10-CM

## 2021-11-24 DIAGNOSIS — R7303 Prediabetes: Secondary | ICD-10-CM | POA: Diagnosis not present

## 2021-11-24 DIAGNOSIS — E559 Vitamin D deficiency, unspecified: Secondary | ICD-10-CM | POA: Diagnosis not present

## 2021-11-24 DIAGNOSIS — I10 Essential (primary) hypertension: Secondary | ICD-10-CM | POA: Diagnosis not present

## 2021-11-24 DIAGNOSIS — Z0000 Encounter for general adult medical examination without abnormal findings: Secondary | ICD-10-CM | POA: Diagnosis not present

## 2021-11-24 DIAGNOSIS — E785 Hyperlipidemia, unspecified: Secondary | ICD-10-CM | POA: Diagnosis not present

## 2021-11-24 DIAGNOSIS — L309 Dermatitis, unspecified: Secondary | ICD-10-CM | POA: Diagnosis not present

## 2021-11-24 DIAGNOSIS — E782 Mixed hyperlipidemia: Secondary | ICD-10-CM | POA: Diagnosis not present

## 2021-11-29 DIAGNOSIS — R7989 Other specified abnormal findings of blood chemistry: Secondary | ICD-10-CM | POA: Diagnosis not present

## 2021-12-05 DIAGNOSIS — M545 Low back pain, unspecified: Secondary | ICD-10-CM | POA: Diagnosis not present

## 2022-03-16 DIAGNOSIS — M797 Fibromyalgia: Secondary | ICD-10-CM | POA: Diagnosis not present

## 2022-03-16 DIAGNOSIS — I10 Essential (primary) hypertension: Secondary | ICD-10-CM | POA: Diagnosis not present

## 2022-03-16 DIAGNOSIS — K219 Gastro-esophageal reflux disease without esophagitis: Secondary | ICD-10-CM | POA: Diagnosis not present

## 2022-03-16 DIAGNOSIS — E782 Mixed hyperlipidemia: Secondary | ICD-10-CM | POA: Diagnosis not present

## 2022-03-16 DIAGNOSIS — E559 Vitamin D deficiency, unspecified: Secondary | ICD-10-CM | POA: Diagnosis not present

## 2022-03-16 DIAGNOSIS — R7303 Prediabetes: Secondary | ICD-10-CM | POA: Diagnosis not present

## 2022-03-16 DIAGNOSIS — Z0001 Encounter for general adult medical examination with abnormal findings: Secondary | ICD-10-CM | POA: Diagnosis not present

## 2022-03-26 ENCOUNTER — Telehealth: Payer: Self-pay

## 2022-03-26 DIAGNOSIS — N9489 Other specified conditions associated with female genital organs and menstrual cycle: Secondary | ICD-10-CM

## 2022-03-26 NOTE — Telephone Encounter (Signed)
Pt called front office stating her daughter received a call from [place] last week and she is calling us back. Explained we have no documented call; however, pt missed MRI follow up appt. Offered for patient to reschedule, pt agreeable. MRI rescheduled for 04/18/22. Lab appt for CMP scheduled on 04/11/22. Pt to see Vergie Living, MD for follow up on 04/30/22. Telephone encounter completed with interpreter [place].

## 2022-04-03 DIAGNOSIS — M797 Fibromyalgia: Secondary | ICD-10-CM | POA: Diagnosis not present

## 2022-04-03 DIAGNOSIS — E559 Vitamin D deficiency, unspecified: Secondary | ICD-10-CM | POA: Diagnosis not present

## 2022-04-03 DIAGNOSIS — K219 Gastro-esophageal reflux disease without esophagitis: Secondary | ICD-10-CM | POA: Diagnosis not present

## 2022-04-03 DIAGNOSIS — I10 Essential (primary) hypertension: Secondary | ICD-10-CM | POA: Diagnosis not present

## 2022-04-03 DIAGNOSIS — R7303 Prediabetes: Secondary | ICD-10-CM | POA: Diagnosis not present

## 2022-04-03 DIAGNOSIS — E782 Mixed hyperlipidemia: Secondary | ICD-10-CM | POA: Diagnosis not present

## 2022-04-11 ENCOUNTER — Other Ambulatory Visit: Payer: Medicare Other

## 2022-04-11 DIAGNOSIS — N9489 Other specified conditions associated with female genital organs and menstrual cycle: Secondary | ICD-10-CM

## 2022-04-12 LAB — COMPREHENSIVE METABOLIC PANEL
ALT: 25 IU/L (ref 0–32)
AST: 28 IU/L (ref 0–40)
Albumin/Globulin Ratio: 1.3 (ref 1.2–2.2)
Albumin: 4 g/dL (ref 3.8–4.8)
Alkaline Phosphatase: 90 IU/L (ref 44–121)
BUN/Creatinine Ratio: 20 (ref 12–28)
BUN: 17 mg/dL (ref 8–27)
Bilirubin Total: 0.2 mg/dL (ref 0.0–1.2)
CO2: 22 mmol/L (ref 20–29)
Calcium: 9.1 mg/dL (ref 8.7–10.3)
Chloride: 102 mmol/L (ref 96–106)
Creatinine, Ser: 0.84 mg/dL (ref 0.57–1.00)
Globulin, Total: 3.1 g/dL (ref 1.5–4.5)
Glucose: 94 mg/dL (ref 70–99)
Potassium: 3.9 mmol/L (ref 3.5–5.2)
Sodium: 139 mmol/L (ref 134–144)
Total Protein: 7.1 g/dL (ref 6.0–8.5)
eGFR: 76 mL/min/{1.73_m2} (ref >59)

## 2022-04-15 DIAGNOSIS — M5412 Radiculopathy, cervical region: Secondary | ICD-10-CM | POA: Diagnosis not present

## 2022-04-15 DIAGNOSIS — M9903 Segmental and somatic dysfunction of lumbar region: Secondary | ICD-10-CM | POA: Diagnosis not present

## 2022-04-15 DIAGNOSIS — M62830 Muscle spasm of back: Secondary | ICD-10-CM | POA: Diagnosis not present

## 2022-04-15 DIAGNOSIS — M5021 Other cervical disc displacement,  high cervical region: Secondary | ICD-10-CM | POA: Diagnosis not present

## 2022-04-15 DIAGNOSIS — M9901 Segmental and somatic dysfunction of cervical region: Secondary | ICD-10-CM | POA: Diagnosis not present

## 2022-04-15 DIAGNOSIS — M5416 Radiculopathy, lumbar region: Secondary | ICD-10-CM | POA: Diagnosis not present

## 2022-04-18 ENCOUNTER — Ambulatory Visit (HOSPITAL_COMMUNITY)
Admission: RE | Admit: 2022-04-18 | Discharge: 2022-04-18 | Disposition: A | Discharge status: Home / Self Care | Payer: Medicare Other | Source: Ambulatory Visit | Attending: Obstetrics and Gynecology | Admitting: Obstetrics and Gynecology

## 2022-04-18 DIAGNOSIS — N323 Diverticulum of bladder: Secondary | ICD-10-CM | POA: Diagnosis not present

## 2022-04-18 DIAGNOSIS — K5730 Diverticulosis of large intestine without perforation or abscess without bleeding: Secondary | ICD-10-CM | POA: Diagnosis not present

## 2022-04-18 DIAGNOSIS — N9489 Other specified conditions associated with female genital organs and menstrual cycle: Secondary | ICD-10-CM | POA: Diagnosis present

## 2022-04-18 MED ORDER — GADOBUTROL 1 MMOL/ML IV SOLN
10.0000 mL | Freq: Once | INTRAVENOUS | Status: AC | PRN
  Administered 2022-04-18: 7 mL via INTRAVENOUS

## 2022-04-30 ENCOUNTER — Encounter: Payer: Self-pay | Admitting: Obstetrics and Gynecology

## 2022-04-30 ENCOUNTER — Other Ambulatory Visit (HOSPITAL_COMMUNITY)
Admission: RE | Admit: 2022-04-30 | Discharge: 2022-04-30 | Disposition: A | Discharge status: Home / Self Care | Payer: Medicare Other | Source: Ambulatory Visit | Attending: Obstetrics and Gynecology | Admitting: Obstetrics and Gynecology

## 2022-04-30 ENCOUNTER — Ambulatory Visit (INDEPENDENT_AMBULATORY_CARE_PROVIDER_SITE_OTHER): Payer: Medicare Other | Admitting: Obstetrics and Gynecology

## 2022-04-30 ENCOUNTER — Other Ambulatory Visit: Payer: Self-pay

## 2022-04-30 VITALS — BP 125/81 | HR 77 | Wt 154.9 lb

## 2022-04-30 DIAGNOSIS — R300 Dysuria: Secondary | ICD-10-CM | POA: Diagnosis not present

## 2022-04-30 DIAGNOSIS — R1032 Left lower quadrant pain: Secondary | ICD-10-CM | POA: Insufficient documentation

## 2022-04-30 DIAGNOSIS — N9489 Other specified conditions associated with female genital organs and menstrual cycle: Secondary | ICD-10-CM | POA: Diagnosis not present

## 2022-04-30 DIAGNOSIS — N760 Acute vaginitis: Secondary | ICD-10-CM

## 2022-04-30 DIAGNOSIS — N8110 Cystocele, unspecified: Secondary | ICD-10-CM | POA: Diagnosis not present

## 2022-04-30 DIAGNOSIS — Z1331 Encounter for screening for depression: Secondary | ICD-10-CM | POA: Diagnosis not present

## 2022-04-30 DIAGNOSIS — Z5941 Food insecurity: Secondary | ICD-10-CM

## 2022-04-30 LAB — POCT URINALYSIS DIP (DEVICE)
Glucose, UA: NEGATIVE mg/dL
Hgb urine dipstick: NEGATIVE
Nitrite: NEGATIVE
Protein, ur: 30 mg/dL — AB
Specific Gravity, Urine: >=1.03 (ref 1.005–1.030)
Urobilinogen, UA: 1 mg/dL (ref 0.0–1.0)
pH: 5.5 (ref 5.0–8.0)

## 2022-04-30 NOTE — Progress Notes (Addendum)
Obstetrics and Gynecology Visit Return Patient Evaluation  Appointment Date: 04/30/2022  Primary Care Provider: [facility]: [facility] for [facility]  Chief Complaint: MRI f/u  History of Present Illness:  Amy Cline is a 66 y.o. with above CC.  Patient initially seen by Dr. Penne Lash on 11/30/20 due to a, "2.8 x 1.7 cm partially imaged right hemipelvic cysticlesion may involve the right adnexa." This was seen on 08/12/20 MRI lumbar spine for w/u for her low back pain.  Exam negative with Dr. Penne Lash and she was set up for a pelvic u/s and follow up visit.  U/s was negative with no ovaries seen.   Patient states she had a hysterectomy that was done vaginally in [place] she was 20 and it was due to prolapse. She states that they told that they "took everything", but she isn't explicitly certain if they took her ovaries. She states she has tried to get records but has been unable to.    I saw her on 12/22/20 and ordered an MRI pelvis, and followed up with her on 02/23/21 and she was asymptomatic. Her 4/8 MRI showed a 3.5 x 2.8 x 1.5cm righ adnexa tubular cystic lesion suggesting a hydrosalpingx. Radiology recommended a 6-24m repeat test; I also talked to Gyn Onc and Dr. Pricilla Holm felt a 55m MRI is fine and if stable and no further s/s then no need to do serial MRIs, so plan made to do a ca125, which was negative and to repeat an MRI in 6 months.   Interval History: Patient eventually had an MRI April 18 2022 and showed smaller area on the right but new (small) area on the left (see below). Pt having occasional LLQ pain that feels like an ovarian cyst she had in the mid 1990s that she had removed. She has bloating sometimes and discomfort lasts for less than a minute and up to 17m, not daily but sometimes can happen 3x per day.   No blood in BMs; pt also thinks her prolapse is back and also having some vaginal itching  Review of Systems: as noted in the  History of Present Illness.  Patient Active Problem List   Diagnosis Date Noted   LLQ pain 04/30/2022   Age-related osteoporosis without current pathological fracture 01/23/2021   Hyperlipidemia 01/23/2021   Impaired fasting glucose 01/23/2021   Insomnia 01/23/2021   Midline cystocele 01/23/2021   Varicose veins of bilateral lower extremities with pain 01/23/2021   Vitamin D deficiency 01/23/2021   Spinal stenosis in cervical region 01/19/2021   Language barrier 12/22/2020   Adnexal mass 12/19/2020   Lumbar radiculopathy 09/07/2020   Right arm pain 03/26/2019   Chronic migraine without aura without status migrainosus, not intractable 11/18/2018   Rectocele, female 08/11/2018   Scalp tenderness 08/11/2018   Atypical chest pain 07/17/2016   Essential hypertension 03/29/2016   Fibromyalgia 03/29/2016   Gastroesophageal reflux disease without esophagitis 03/29/2016   Chronic tension-type headache, not intractable 08/16/2015   Medications:  [name] had no medications administered during this visit. Current Outpatient Medications  Medication Sig Dispense Refill   amitriptyline (ELAVIL) 25 MG tablet Take 1 tablet (25 mg total) by mouth at bedtime. Appointment needed for further refills or have primary care refill. 90 tablet 0   atorvastatin (LIPITOR) 20 MG tablet TAKE 1 TABLET BY MOUTH EVERY DAY 90 tablet 0   Cyanocobalamin (VITAMIN B-12 PO) Take by mouth daily.     gabapentin (NEURONTIN) 400 MG capsule  Take 400 mg by mouth 3 (three) times daily.     losartan-hydrochlorothiazide (HYZAAR) 50-12.5 MG tablet Take 1 tablet by mouth daily.     metoprolol succinate (TOPROL-XL) 25 MG 24 hr tablet Take 25 mg by mouth daily.     omeprazole (PRILOSEC) 20 MG capsule TAKE 1 CAPSULE BY MOUTH EVERY DAY. Please make PCP appointment. 30 capsule 0   Current Facility-Administered Medications  Medication Dose Route Frequency Provider Last Rate Last Admin   0.9 %  sodium chloride infusion  500 mL  Intravenous Once Meryl Dare, MD        Allergies: has No Known Allergies.  Physical Exam:  BP 125/81   Pulse 77   Wt 154 lb 14.4 oz (70.3 kg)   BMI 27.44 kg/m  Body mass index is 27.44 kg/m. General appearance: Well nourished, well developed female in no acute distress.  Abdomen: diffusely non tender to palpation, non distended, and no masses, hernias Neuro/Psych:  Normal mood and affect.    EGBUS, vagina all wnl  Radiology: Narrative & Impression  CLINICAL DATA:  Follow-up cystic lesion seen on prior MRI.   EXAM: MRI PELVIS WITHOUT AND WITH CONTRAST   TECHNIQUE: Multiplanar multisequence MR imaging of the pelvis was performed both before and after administration of intravenous contrast.   CONTRAST:  68mL GADAVIST GADOBUTROL 1 MMOL/ML IV SOLN   COMPARISON:  MRI pelvis January 20, 2021   FINDINGS: Urinary Tract: Small left-sided bladder wall diverticulum.   Bowel: Colonic diverticulosis without findings of acute diverticulitis.   Vascular/Lymphatic: No pathologically enlarged lymph nodes or other significant abnormality.   Reproductive:   Uterus: Measures surgically absent.   Right ovary: Small tubular cystic structure in the right adnexa is decreased in size now measuring 1.5 x 0.9 cm on image 11/3 previously 3.5 x 2.8 cm. Once again this demonstrates no soft tissue component, abnormal enhancement or wall/septal thickening most likely reflecting hydrosalpinx.   Left ovary: New small tubular cystic structure in the left adnexa measures up to 1.9 x 1.2 cm on image 11/3. This does not demonstrate suspicious enhancement or abnormal soft tissue or wall/septal thickening.   Other: Pelvic floor laxity. No significant abdominopelvic free fluid.   Musculoskeletal:  No suspicious osseous lesion.   IMPRESSION: 1. Interval decrease in size of the small tubular cystic structure in the right adnexa most consistent with a benign finding and again favored to reflect  hydrosalpinx. 2. New tubular cystic structure in the left adnexa measuring up to 1.9 cm is also favored to reflect hydrosalpinx with a complex cystic ovarian lesion a less likely differential consideration. Recommend follow-up ultrasound in 6-12 months to assess stability/resolution. Recommend follow-up US in 6-12 months. Note: This recommendation does not apply to premenarchal patients and to those with increased risk (genetic, family history, elevated tumor markers or other high-risk factors) of ovarian cancer. Reference: JACR 2020 Feb; 17(2):248-254 3. Prior hysterectomy with pelvic floor laxity.     Electronically Signed   By: Maudry Mayhew M.D.   On: 04/18/2022 11:38    Assessment: pt stable  Plan:  1. Dysuria - Urinalysis, Routine w reflex microscopic - Urine Culture - Cervicovaginal ancillary only( [place]) - POCT urinalysis dip (device)  2. Acute vaginitis - Cervicovaginal ancillary only( [place])  3. Adnexal mass I told her that since she is having some s/s and new small area on the left that I recommend labwork and diagnostic laparoscopy, which she is amenable to, although suspicion is low for  malignancy. Patient amenable to plan  - CA 125 - CEA - Cancer antigen 19-9  4. Positive depression screening - Ambulatory referral to [facility]  5. Food insecurity - AMBULATORY REFERRAL TO [place] [facility]  6. LLQ pain  7. Vaginal prolapse None apparent today   In person interpreter used   RTC: post op  Durene Romans MD Attending Center for [facility])

## 2022-05-01 LAB — CERVICOVAGINAL ANCILLARY ONLY
Bacterial Vaginitis (gardnerella): NEGATIVE
Candida Glabrata: NEGATIVE
Candida Vaginitis: POSITIVE — AB
Chlamydia: NEGATIVE
Comment: NEGATIVE
Comment: NEGATIVE
Comment: NEGATIVE
Comment: NEGATIVE
Comment: NEGATIVE
Comment: NORMAL
Neisseria Gonorrhea: NEGATIVE
Trichomonas: NEGATIVE

## 2022-05-01 MED ORDER — FLUCONAZOLE 150 MG PO TABS: 150.0000 mg | ORAL_TABLET | Freq: Once | ORAL | 0 refills | Status: AC

## 2022-05-02 ENCOUNTER — Telehealth: Payer: Self-pay | Admitting: *Deleted

## 2022-05-02 NOTE — Telephone Encounter (Signed)
I called patient with Interpreter Eda Royal and informed her we need her to sign a BTL consent. I also gave her her surgery date as she said she was unaware of date. She reports she also needed to give a urine because they told her she did not give enough at her visit. I scheduled her for nurse visit for 05/03/22.  Nancy Fetter

## 2022-05-03 ENCOUNTER — Telehealth: Payer: Self-pay

## 2022-05-03 ENCOUNTER — Ambulatory Visit (INDEPENDENT_AMBULATORY_CARE_PROVIDER_SITE_OTHER): Payer: Medicare Other | Admitting: General Practice

## 2022-05-03 VITALS — BP 137/85 | HR 83 | Ht 63.0 in | Wt 154.0 lb

## 2022-05-03 DIAGNOSIS — R300 Dysuria: Secondary | ICD-10-CM | POA: Diagnosis not present

## 2022-05-03 LAB — POCT URINALYSIS DIP (DEVICE)
Bilirubin Urine: NEGATIVE
Glucose, UA: NEGATIVE mg/dL
Hgb urine dipstick: NEGATIVE
Ketones, ur: NEGATIVE mg/dL
Nitrite: POSITIVE — AB
Protein, ur: NEGATIVE mg/dL
Specific Gravity, Urine: 1.015 (ref 1.005–1.030)
Urobilinogen, UA: 0.2 mg/dL (ref 0.0–1.0)
pH: 5.5 (ref 5.0–8.0)

## 2022-05-03 LAB — URINE CULTURE

## 2022-05-03 MED ORDER — NITROFURANTOIN MONOHYD MACRO 100 MG PO CAPS: 100.0000 mg | ORAL_CAPSULE | Freq: Two times a day (BID) | ORAL | 0 refills | Status: AC

## 2022-05-03 MED ORDER — FLUCONAZOLE 150 MG PO TABS: 150.0000 mg | ORAL_TABLET | ORAL | 0 refills | Status: AC

## 2022-05-03 NOTE — Progress Notes (Signed)
Patient presents to office today reports dysuria with occasional incomplete emptying x 2-3 weeks. She was here the other day for this but didn't leave enough of a sample for culture. UA reveals nitrites. Will send in Macrobid per protocol and urine culture ordered. Patient informed and provided medication instructions. BTL consent also signed with patient due to upcoming surgery.  Chase Caller RN BSN 05/03/22

## 2022-05-03 NOTE — Addendum Note (Signed)
Addended by: Toluca Bing on: 05/03/2022 04:21 PM   Modules accepted: Orders

## 2022-05-03 NOTE — Telephone Encounter (Signed)
-----   Message from Adelino Bing, MD sent at 05/03/2022  4:20 PM EDT ----- It looks like abx have already been sent in for the UTI. Please let her know that she also has a yeast infecton and diflucan was sent in too. thanks

## 2022-05-03 NOTE — Telephone Encounter (Signed)
Call placed to pt with CAP interpreter- Amy Cline  [phone]. Spoke with pt. Pt given results per Dr Vergie Living. Pt verbalized understanding and agreeable to plan of care.   [name],RNC

## 2022-05-06 LAB — URINE CULTURE

## 2022-05-07 NOTE — BH Specialist Note (Signed)
Integrated Behavioral Health via Telemedicine Visit  05/21/2022 [name] [id number]  Number of Integrated Behavioral Health Clinician visits: 1- Initial Visit  Session Start time: 0919   Session End time: 1029  Total time in minutes: 70   Referring Provider: [place] Bing, MD Patient/Family location: Home [facility] Provider location: Center for [facility]  All persons participating in visit: Patient Amy Cline and [facility] Amy Cline   Types of Service: Individual psychotherapy and Telephone visit  I connected with [name] and/or Shaneque Coulson's  n/a  via  Telephone or Engineer, civil (consulting)  (Video is Caregility application) and verified that I am speaking with the correct person using two identifiers. Discussed confidentiality: Yes   I discussed the limitations of telemedicine and the availability of in person appointments.  Discussed there is a possibility of technology failure and discussed alternative modes of communication if that failure occurs.  I discussed that engaging in this telemedicine visit, they consent to the provision of behavioral healthcare and the services will be billed under their insurance.  Patient and/or legal guardian expressed understanding and consented to Telemedicine visit: Yes   Presenting Concerns: Patient and/or family reports the following symptoms/concerns: Concern about change in sleep pattern (cries when not sleepy until very late, then sleeps until afternoon); poor appetite (ex: "eat breakfast, then maybe a banana before bed" only); says she was told she needed to start hormones after hysterectomy in [place], but has not started since in the [place]. Migraines, but "only taking (Elavil) medicine with headache", not daily as prescribed; no refills remaining. Since stopping work due to falling and hurting hip, spends time outdoors daily. Lost son 21 years ago, would have been 30 this  coming November; grandchildren help to cope with loss.  Duration of problem: Increase over time ; Severity of problem: moderate  Patient and/or Family's Strengths/Protective Factors: Social connections, Concrete supports in place (healthy food, safe environments, etc.), and Sense of purpose  Goals Addressed: Patient will:  Reduce symptoms of: anxiety and depression   Demonstrate ability to: Increase healthy adjustment to current life circumstances and Improve medication compliance  Progress towards Goals: Ongoing  Interventions: Interventions utilized:  Functional Assessment of ADLs, Psychoeducation and/or Health Education, and Supportive Reflection Standardized Assessments completed: Not Needed  Patient and/or Family Response: Patient agrees with treatment plan.   Assessment: Patient currently experiencing Adjustment disorder with mixed anxious and depressed mood.   Patient may benefit from psychoeducation and brief therapeutic interventions regarding coping with symptoms of depression and anxiety .  Plan: Follow up with behavioral health clinician on : Two weeks Behavioral recommendations:  -Continue spending time outdoors daily; consider switching outdoor time to morning time, first hour of the day upon waking (notice if any change in sleepiness at night) -Discuss refills for amitriptyline/Elavil with PCP or Neurologist; take as prescribed; Discuss side effect of sleepiness  Gabapentin with PCP -Continue spending time with grandchildren as much as possible (face time, etc.) Referral(s): Integrated [employer] [facility] (In Clinic)  I discussed the assessment and treatment plan with the patient and/or parent/guardian. They were provided an opportunity to ask questions and all were answered. They agreed with the plan and demonstrated an understanding of the instructions.   They were advised to call back or seek an in-person evaluation if the symptoms worsen or if the  condition fails to improve as anticipated.  Rae Lips, LCSW     04/30/2022    9:14 AM 02/23/2021  11:15 AM 12/29/2020    8:32 AM 11/30/2020    4:32 PM 07/02/2019   11:18 AM  Depression screen PHQ 2/9  Decreased Interest 3 1 1 2 1   Down, Depressed, Hopeless 1 0 0 2 1  PHQ - 2 Score 4 1 1 4 2   Altered sleeping 3 3 3 2 3   Tired, decreased energy 3 3 3 3 3   Change in appetite 3 0 2 2 2   Feeling bad or failure about yourself  0 0 0 1 1  Trouble concentrating 2 1 1  0 3  Moving slowly or fidgety/restless 0 0 0 0 1  Suicidal thoughts 0 0 0 0 0  PHQ-9 Score 15 8 10 12 15       04/30/2022    9:14 AM 02/23/2021   11:16 AM 12/29/2020    8:32 AM 11/30/2020    4:32 PM  GAD 7 : Generalized Anxiety Score  Nervous, Anxious, on Edge 1 1 1  0  Control/stop worrying 3 1 1  0  Worry too much - different things 3 1 1 3   Trouble relaxing 3 1 1 3   Restless 0 1 0 0  Easily annoyed or irritable 0 0 1 0  Afraid - awful might happen 2 1 1 3   Total GAD 7 Score 12 6 6 9       04/30/2022    9:14 AM 02/23/2021   11:15 AM 12/29/2020    8:32 AM 11/30/2020    4:32 PM 07/02/2019   11:18 AM  Depression screen PHQ 2/9  Decreased Interest 3 1 1 2 1   Down, Depressed, Hopeless 1 0 0 2 1  PHQ - 2 Score 4 1 1 4 2   Altered sleeping 3 3 3 2 3   Tired, decreased energy 3 3 3 3 3   Change in appetite 3 0 2 2 2   Feeling bad or failure about yourself  0 0 0 1 1  Trouble concentrating 2 1 1  0 3  Moving slowly or fidgety/restless 0 0 0 0 1  Suicidal thoughts 0 0 0 0 0  PHQ-9 Score 15 8 10 12  15

## 2022-05-21 ENCOUNTER — Ambulatory Visit (INDEPENDENT_AMBULATORY_CARE_PROVIDER_SITE_OTHER): Payer: Medicare Other | Admitting: Clinical

## 2022-05-21 DIAGNOSIS — F4323 Adjustment disorder with mixed anxiety and depressed mood: Secondary | ICD-10-CM

## 2022-05-23 ENCOUNTER — Telehealth: Payer: Self-pay | Admitting: Obstetrics and Gynecology

## 2022-05-23 ENCOUNTER — Telehealth: Payer: Self-pay | Admitting: General Practice

## 2022-05-23 DIAGNOSIS — N9489 Other specified conditions associated with female genital organs and menstrual cycle: Secondary | ICD-10-CM

## 2022-05-23 NOTE — Addendum Note (Signed)
Addended by: Palisade Bing on: 05/23/2022 03:09 PM   Modules accepted: Level of Service

## 2022-05-23 NOTE — Telephone Encounter (Signed)
Called patient with Eda assisting with spanish interpretation & informed patient of MRI scheduled for 1/29 @ 230. Patient verbalized understanding.

## 2022-05-23 NOTE — BH Specialist Note (Signed)
  Integrated Behavioral Health Follow Up In-Person Visit  MRN: [id number] Name: Amy Cline  Number of Integrated Behavioral Health Clinician visits: 2- Second Visit  Session Start time: 0930   Session End time: 0945  Total time in minutes: 15   Types of Service: Individual psychotherapy  Interpretor:Yes.   Interpretor Name and Language: Eda, Spanish (live)  Subjective: Amy Cline is a 66 y.o. female accompanied by  n/a Patient was referred by Deaver Bing, MD for positive depression screen. Patient reports the following symptoms/concerns: Sleep remains difficult until early morning; wonders why surgery was cancelled on 08/07/22; continues spending time outdoors daily and time with grandchildren often to lift her mood.  Duration of problem: Increasing over time; Severity of problem: moderate  Objective: Mood: NA and Affect: Appropriate Risk of harm to self or others: No plan to harm self or others  Life Context: Family and Social: Pt lives by herself; sees family often School/Work: - Self-Care: - Life Changes: Health issues  Patient and/or Family's Strengths/Protective Factors: Social connections, Concrete supports in place (healthy food, safe environments, etc.), and Sense of purpose  Goals Addressed: Patient will:  Reduce symptoms of: anxiety and depression    Demonstrate ability to: Increase healthy adjustment to current life circumstances  Progress towards Goals: Ongoing  Interventions: Interventions utilized:  Supportive Reflection Standardized Assessments completed: Not Needed  Patient and/or Family Response: Patient agrees with treatment plan.   Patient Centered Plan: Patient is on the following Treatment Plan(s): IBH Assessment: Patient currently experiencing Adjustment disorder with mixed anxious and depressed mood.   Patient may benefit from continued therapeutic intervention today.  Plan: Follow up with behavioral health clinician on :  Call Shahil Speegle at [phone], as needed. Behavioral recommendations:  -Continue plan to attend upcoming medical appointments -Continue spending time outdoors daily; time with grandchildren as much as able -Continue plan to take medications as prescribed; discuss with PCP and neurologist any refills needed Referral(s): Integrated [employer] [facility] (In Clinic)  Valetta Close [place], [place]     04/30/2022    9:14 AM 02/23/2021   11:15 AM 12/29/2020    8:32 AM 11/30/2020    4:32 PM 07/02/2019   11:18 AM  Depression screen PHQ 2/9  Decreased Interest 3 1 1 2 1   Down, Depressed, Hopeless 1 0 0 2 1  PHQ - 2 Score 4 1 1 4 2   Altered sleeping 3 3 3 2 3   Tired, decreased energy 3 3 3 3 3   Change in appetite 3 0 2 2 2   Feeling bad or failure about yourself  0 0 0 1 1  Trouble concentrating 2 1 1  0 3  Moving slowly or fidgety/restless 0 0 0 0 1  Suicidal thoughts 0 0 0 0 0  PHQ-9 Score 15 8 10 12 15       04/30/2022    9:14 AM 02/23/2021   11:16 AM 12/29/2020    8:32 AM 11/30/2020    4:32 PM  GAD 7 : Generalized Anxiety Score  Nervous, Anxious, on Edge 1 1 1  0  Control/stop worrying 3 1 1  0  Worry too much - different things 3 1 1 3   Trouble relaxing 3 1 1 3   Restless 0 1 0 0  Easily annoyed or irritable 0 0 1 0  Afraid - awful might happen 2 1 1 3   Total GAD 7 Score 12 6 6  9

## 2022-05-23 NOTE — Telephone Encounter (Signed)
GYN Telephone Note I spoke to the patient and that for some reason her labs from her last visit aren't crossing over. I spoke to [facility] and everything was normal: CEA is 1.5, CA 19-9 is 7 and CA 125 is 9.5   She doesn't have any discomfort and feels fine. I told her that since she is feeling fine that I would recommend repeating her MRI six months from her last and seeing her back for a visit sometime thereafter.   Request sent to clinical pool for scheduling  Cornelia Copa MD Attending Center for [facility] (Faculty Practice) 05/23/2022 Time: 1158am

## 2022-05-30 LAB — URINALYSIS, ROUTINE W REFLEX MICROSCOPIC

## 2022-05-30 LAB — CANCER ANTIGEN 19-9: CA 19-9: 7 U/mL (ref 0–35)

## 2022-05-30 LAB — CEA: CEA: 1.5 ng/mL (ref 0.0–4.7)

## 2022-05-30 LAB — CA 125: Cancer Antigen (CA) 125: 9.5 U/mL (ref 0.0–38.1)

## 2022-06-06 ENCOUNTER — Ambulatory Visit: Payer: Medicare Other | Admitting: Clinical

## 2022-06-06 DIAGNOSIS — F4323 Adjustment disorder with mixed anxiety and depressed mood: Secondary | ICD-10-CM

## 2022-06-22 DIAGNOSIS — E782 Mixed hyperlipidemia: Secondary | ICD-10-CM | POA: Diagnosis not present

## 2022-06-22 DIAGNOSIS — K219 Gastro-esophageal reflux disease without esophagitis: Secondary | ICD-10-CM | POA: Diagnosis not present

## 2022-06-22 DIAGNOSIS — I10 Essential (primary) hypertension: Secondary | ICD-10-CM | POA: Diagnosis not present

## 2022-06-22 DIAGNOSIS — E559 Vitamin D deficiency, unspecified: Secondary | ICD-10-CM | POA: Diagnosis not present

## 2022-06-22 DIAGNOSIS — M797 Fibromyalgia: Secondary | ICD-10-CM | POA: Diagnosis not present

## 2022-06-22 DIAGNOSIS — R7303 Prediabetes: Secondary | ICD-10-CM | POA: Diagnosis not present

## 2022-06-22 DIAGNOSIS — Z23 Encounter for immunization: Secondary | ICD-10-CM | POA: Diagnosis not present

## 2022-08-01 ENCOUNTER — Telehealth: Payer: Self-pay | Admitting: *Deleted

## 2022-08-01 NOTE — Telephone Encounter (Signed)
Pt left VM message with aid from [facility] interpreter [phone]. She stated that she has not received a call regarding her surgery on 10/24. I called pt back w/Eda Royal interpreter and discussed her concern. I stated to pt that her surgery has been cancelled. Per Dr. Ilda Basset note from visit on 8/9, he recommended that she have repeat MRI pelvis 6 months from last scan on 7/5. This study has been scheduled on 11/12/22 @ 3 pm @ [facility].  Pt voiced understanding and had no additional questions.

## 2022-08-03 DIAGNOSIS — M797 Fibromyalgia: Secondary | ICD-10-CM | POA: Diagnosis not present

## 2022-08-03 DIAGNOSIS — R7303 Prediabetes: Secondary | ICD-10-CM | POA: Diagnosis not present

## 2022-08-03 DIAGNOSIS — G43111 Migraine with aura, intractable, with status migrainosus: Secondary | ICD-10-CM | POA: Diagnosis not present

## 2022-08-03 DIAGNOSIS — E559 Vitamin D deficiency, unspecified: Secondary | ICD-10-CM | POA: Diagnosis not present

## 2022-08-03 DIAGNOSIS — E782 Mixed hyperlipidemia: Secondary | ICD-10-CM | POA: Diagnosis not present

## 2022-08-03 DIAGNOSIS — K219 Gastro-esophageal reflux disease without esophagitis: Secondary | ICD-10-CM | POA: Diagnosis not present

## 2022-08-03 DIAGNOSIS — I10 Essential (primary) hypertension: Secondary | ICD-10-CM | POA: Diagnosis not present

## 2022-08-07 ENCOUNTER — Ambulatory Visit: Admit: 2022-08-07 | Payer: Medicare Other | Admitting: Obstetrics and Gynecology

## 2022-08-07 SURGERY — SALPINGO-OOPHORECTOMY, BILATERAL, LAPAROSCOPIC
Anesthesia: Choice | Laterality: Bilateral

## 2022-09-18 DIAGNOSIS — E559 Vitamin D deficiency, unspecified: Secondary | ICD-10-CM | POA: Diagnosis not present

## 2022-09-18 DIAGNOSIS — R7303 Prediabetes: Secondary | ICD-10-CM | POA: Diagnosis not present

## 2022-09-18 DIAGNOSIS — G43111 Migraine with aura, intractable, with status migrainosus: Secondary | ICD-10-CM | POA: Diagnosis not present

## 2022-09-18 DIAGNOSIS — K219 Gastro-esophageal reflux disease without esophagitis: Secondary | ICD-10-CM | POA: Diagnosis not present

## 2022-09-18 DIAGNOSIS — M797 Fibromyalgia: Secondary | ICD-10-CM | POA: Diagnosis not present

## 2022-09-18 DIAGNOSIS — I10 Essential (primary) hypertension: Secondary | ICD-10-CM | POA: Diagnosis not present

## 2022-09-18 DIAGNOSIS — E782 Mixed hyperlipidemia: Secondary | ICD-10-CM | POA: Diagnosis not present

## 2022-11-01 DIAGNOSIS — H35033 Hypertensive retinopathy, bilateral: Secondary | ICD-10-CM | POA: Diagnosis not present

## 2022-11-01 DIAGNOSIS — H40013 Open angle with borderline findings, low risk, bilateral: Secondary | ICD-10-CM | POA: Diagnosis not present

## 2022-11-01 DIAGNOSIS — H2513 Age-related nuclear cataract, bilateral: Secondary | ICD-10-CM | POA: Diagnosis not present

## 2022-11-01 DIAGNOSIS — H524 Presbyopia: Secondary | ICD-10-CM | POA: Diagnosis not present

## 2022-11-01 DIAGNOSIS — H5203 Hypermetropia, bilateral: Secondary | ICD-10-CM | POA: Diagnosis not present

## 2022-11-01 DIAGNOSIS — H52223 Regular astigmatism, bilateral: Secondary | ICD-10-CM | POA: Diagnosis not present

## 2022-11-12 ENCOUNTER — Ambulatory Visit (HOSPITAL_COMMUNITY): Payer: 59 | Attending: Obstetrics and Gynecology

## 2022-11-20 DIAGNOSIS — Z1231 Encounter for screening mammogram for malignant neoplasm of breast: Secondary | ICD-10-CM | POA: Diagnosis not present

## 2022-12-13 DIAGNOSIS — Z0000 Encounter for general adult medical examination without abnormal findings: Secondary | ICD-10-CM | POA: Diagnosis not present

## 2022-12-13 DIAGNOSIS — E559 Vitamin D deficiency, unspecified: Secondary | ICD-10-CM | POA: Diagnosis not present

## 2022-12-13 DIAGNOSIS — G43111 Migraine with aura, intractable, with status migrainosus: Secondary | ICD-10-CM | POA: Diagnosis not present

## 2022-12-13 DIAGNOSIS — K219 Gastro-esophageal reflux disease without esophagitis: Secondary | ICD-10-CM | POA: Diagnosis not present

## 2022-12-13 DIAGNOSIS — E782 Mixed hyperlipidemia: Secondary | ICD-10-CM | POA: Diagnosis not present

## 2022-12-13 DIAGNOSIS — M797 Fibromyalgia: Secondary | ICD-10-CM | POA: Diagnosis not present

## 2022-12-13 DIAGNOSIS — I10 Essential (primary) hypertension: Secondary | ICD-10-CM | POA: Diagnosis not present

## 2022-12-13 DIAGNOSIS — M25551 Pain in right hip: Secondary | ICD-10-CM | POA: Diagnosis not present

## 2022-12-13 DIAGNOSIS — R7303 Prediabetes: Secondary | ICD-10-CM | POA: Diagnosis not present

## 2022-12-27 DIAGNOSIS — E559 Vitamin D deficiency, unspecified: Secondary | ICD-10-CM | POA: Diagnosis not present

## 2022-12-27 DIAGNOSIS — I10 Essential (primary) hypertension: Secondary | ICD-10-CM | POA: Diagnosis not present

## 2022-12-27 DIAGNOSIS — N3000 Acute cystitis without hematuria: Secondary | ICD-10-CM | POA: Diagnosis not present

## 2022-12-27 DIAGNOSIS — M25511 Pain in right shoulder: Secondary | ICD-10-CM | POA: Diagnosis not present

## 2022-12-27 DIAGNOSIS — G43111 Migraine with aura, intractable, with status migrainosus: Secondary | ICD-10-CM | POA: Diagnosis not present

## 2022-12-27 DIAGNOSIS — M797 Fibromyalgia: Secondary | ICD-10-CM | POA: Diagnosis not present

## 2022-12-27 DIAGNOSIS — K219 Gastro-esophageal reflux disease without esophagitis: Secondary | ICD-10-CM | POA: Diagnosis not present

## 2022-12-27 DIAGNOSIS — E782 Mixed hyperlipidemia: Secondary | ICD-10-CM | POA: Diagnosis not present

## 2022-12-27 DIAGNOSIS — R7303 Prediabetes: Secondary | ICD-10-CM | POA: Diagnosis not present

## 2023-01-01 DIAGNOSIS — M7061 Trochanteric bursitis, right hip: Secondary | ICD-10-CM | POA: Diagnosis not present

## 2023-01-01 DIAGNOSIS — M25511 Pain in right shoulder: Secondary | ICD-10-CM | POA: Diagnosis not present

## 2023-01-08 DIAGNOSIS — H2513 Age-related nuclear cataract, bilateral: Secondary | ICD-10-CM | POA: Diagnosis not present

## 2023-01-08 DIAGNOSIS — H40013 Open angle with borderline findings, low risk, bilateral: Secondary | ICD-10-CM | POA: Diagnosis not present

## 2023-03-04 DIAGNOSIS — H35033 Hypertensive retinopathy, bilateral: Secondary | ICD-10-CM | POA: Diagnosis not present

## 2023-03-04 DIAGNOSIS — H2513 Age-related nuclear cataract, bilateral: Secondary | ICD-10-CM | POA: Diagnosis not present

## 2023-03-04 DIAGNOSIS — H40013 Open angle with borderline findings, low risk, bilateral: Secondary | ICD-10-CM | POA: Diagnosis not present

## 2023-03-04 DIAGNOSIS — H53469 Homonymous bilateral field defects, unspecified side: Secondary | ICD-10-CM | POA: Diagnosis not present

## 2023-03-18 DIAGNOSIS — Z8673 Personal history of transient ischemic attack (TIA), and cerebral infarction without residual deficits: Secondary | ICD-10-CM | POA: Diagnosis not present

## 2023-03-18 DIAGNOSIS — H53469 Homonymous bilateral field defects, unspecified side: Secondary | ICD-10-CM | POA: Diagnosis not present

## 2023-03-22 DIAGNOSIS — Z0000 Encounter for general adult medical examination without abnormal findings: Secondary | ICD-10-CM | POA: Diagnosis not present

## 2023-03-22 DIAGNOSIS — R7303 Prediabetes: Secondary | ICD-10-CM | POA: Diagnosis not present

## 2023-03-22 DIAGNOSIS — I10 Essential (primary) hypertension: Secondary | ICD-10-CM | POA: Diagnosis not present

## 2023-03-22 DIAGNOSIS — K219 Gastro-esophageal reflux disease without esophagitis: Secondary | ICD-10-CM | POA: Diagnosis not present

## 2023-03-22 DIAGNOSIS — E559 Vitamin D deficiency, unspecified: Secondary | ICD-10-CM | POA: Diagnosis not present

## 2023-03-22 DIAGNOSIS — M797 Fibromyalgia: Secondary | ICD-10-CM | POA: Diagnosis not present

## 2023-03-22 DIAGNOSIS — G43111 Migraine with aura, intractable, with status migrainosus: Secondary | ICD-10-CM | POA: Diagnosis not present

## 2023-03-22 DIAGNOSIS — E782 Mixed hyperlipidemia: Secondary | ICD-10-CM | POA: Diagnosis not present

## 2023-04-12 ENCOUNTER — Other Ambulatory Visit: Payer: Self-pay | Admitting: Physician Assistant

## 2023-04-12 DIAGNOSIS — T148XXA Other injury of unspecified body region, initial encounter: Secondary | ICD-10-CM | POA: Diagnosis not present

## 2023-04-12 DIAGNOSIS — R102 Pelvic and perineal pain: Secondary | ICD-10-CM

## 2023-04-12 DIAGNOSIS — R1084 Generalized abdominal pain: Secondary | ICD-10-CM | POA: Diagnosis not present

## 2023-04-17 DIAGNOSIS — H53469 Homonymous bilateral field defects, unspecified side: Secondary | ICD-10-CM | POA: Diagnosis not present

## 2023-04-17 DIAGNOSIS — H35033 Hypertensive retinopathy, bilateral: Secondary | ICD-10-CM | POA: Diagnosis not present

## 2023-04-17 DIAGNOSIS — H40013 Open angle with borderline findings, low risk, bilateral: Secondary | ICD-10-CM | POA: Diagnosis not present

## 2023-04-17 DIAGNOSIS — H2513 Age-related nuclear cataract, bilateral: Secondary | ICD-10-CM | POA: Diagnosis not present

## 2023-04-29 ENCOUNTER — Ambulatory Visit
Admission: RE | Admit: 2023-04-29 | Discharge: 2023-04-29 | Disposition: A | Discharge status: Home / Self Care | Payer: 59 | Source: Ambulatory Visit | Attending: Physician Assistant | Admitting: Physician Assistant

## 2023-04-29 DIAGNOSIS — R102 Pelvic and perineal pain: Secondary | ICD-10-CM

## 2023-06-21 DIAGNOSIS — E782 Mixed hyperlipidemia: Secondary | ICD-10-CM | POA: Diagnosis not present

## 2023-06-21 DIAGNOSIS — I10 Essential (primary) hypertension: Secondary | ICD-10-CM | POA: Diagnosis not present

## 2023-06-21 DIAGNOSIS — M6740 Ganglion, unspecified site: Secondary | ICD-10-CM | POA: Diagnosis not present

## 2023-06-21 DIAGNOSIS — E559 Vitamin D deficiency, unspecified: Secondary | ICD-10-CM | POA: Diagnosis not present

## 2023-06-21 DIAGNOSIS — R7303 Prediabetes: Secondary | ICD-10-CM | POA: Diagnosis not present

## 2023-06-21 DIAGNOSIS — M797 Fibromyalgia: Secondary | ICD-10-CM | POA: Diagnosis not present

## 2023-06-21 DIAGNOSIS — K219 Gastro-esophageal reflux disease without esophagitis: Secondary | ICD-10-CM | POA: Diagnosis not present

## 2023-06-21 DIAGNOSIS — G43111 Migraine with aura, intractable, with status migrainosus: Secondary | ICD-10-CM | POA: Diagnosis not present

## 2023-07-05 DIAGNOSIS — G43111 Migraine with aura, intractable, with status migrainosus: Secondary | ICD-10-CM | POA: Diagnosis not present

## 2023-07-05 DIAGNOSIS — R768 Other specified abnormal immunological findings in serum: Secondary | ICD-10-CM | POA: Diagnosis not present

## 2023-07-05 DIAGNOSIS — E782 Mixed hyperlipidemia: Secondary | ICD-10-CM | POA: Diagnosis not present

## 2023-07-05 DIAGNOSIS — R7303 Prediabetes: Secondary | ICD-10-CM | POA: Diagnosis not present

## 2023-07-05 DIAGNOSIS — M797 Fibromyalgia: Secondary | ICD-10-CM | POA: Diagnosis not present

## 2023-07-05 DIAGNOSIS — E559 Vitamin D deficiency, unspecified: Secondary | ICD-10-CM | POA: Diagnosis not present

## 2023-07-05 DIAGNOSIS — I10 Essential (primary) hypertension: Secondary | ICD-10-CM | POA: Diagnosis not present

## 2023-07-05 DIAGNOSIS — K219 Gastro-esophageal reflux disease without esophagitis: Secondary | ICD-10-CM | POA: Diagnosis not present

## 2023-08-28 DIAGNOSIS — H16223 Keratoconjunctivitis sicca, not specified as Sjogren's, bilateral: Secondary | ICD-10-CM | POA: Diagnosis not present

## 2023-08-28 DIAGNOSIS — H2513 Age-related nuclear cataract, bilateral: Secondary | ICD-10-CM | POA: Diagnosis not present

## 2023-08-28 DIAGNOSIS — H53469 Homonymous bilateral field defects, unspecified side: Secondary | ICD-10-CM | POA: Diagnosis not present

## 2023-08-28 DIAGNOSIS — H40033 Anatomical narrow angle, bilateral: Secondary | ICD-10-CM | POA: Diagnosis not present

## 2023-08-28 DIAGNOSIS — H40013 Open angle with borderline findings, low risk, bilateral: Secondary | ICD-10-CM | POA: Diagnosis not present

## 2023-10-21 DIAGNOSIS — E559 Vitamin D deficiency, unspecified: Secondary | ICD-10-CM | POA: Diagnosis not present

## 2023-10-21 DIAGNOSIS — I10 Essential (primary) hypertension: Secondary | ICD-10-CM | POA: Diagnosis not present

## 2023-10-21 DIAGNOSIS — K219 Gastro-esophageal reflux disease without esophagitis: Secondary | ICD-10-CM | POA: Diagnosis not present

## 2023-10-21 DIAGNOSIS — G43111 Migraine with aura, intractable, with status migrainosus: Secondary | ICD-10-CM | POA: Diagnosis not present

## 2023-10-21 DIAGNOSIS — E782 Mixed hyperlipidemia: Secondary | ICD-10-CM | POA: Diagnosis not present

## 2023-10-21 DIAGNOSIS — R768 Other specified abnormal immunological findings in serum: Secondary | ICD-10-CM | POA: Diagnosis not present

## 2023-10-21 DIAGNOSIS — M797 Fibromyalgia: Secondary | ICD-10-CM | POA: Diagnosis not present

## 2023-10-21 DIAGNOSIS — S66912A Strain of unspecified muscle, fascia and tendon at wrist and hand level, left hand, initial encounter: Secondary | ICD-10-CM | POA: Diagnosis not present

## 2023-10-21 DIAGNOSIS — R7303 Prediabetes: Secondary | ICD-10-CM | POA: Diagnosis not present

## 2023-11-04 DIAGNOSIS — M797 Fibromyalgia: Secondary | ICD-10-CM | POA: Diagnosis not present

## 2023-11-04 DIAGNOSIS — E559 Vitamin D deficiency, unspecified: Secondary | ICD-10-CM | POA: Diagnosis not present

## 2023-11-04 DIAGNOSIS — G43111 Migraine with aura, intractable, with status migrainosus: Secondary | ICD-10-CM | POA: Diagnosis not present

## 2023-11-04 DIAGNOSIS — R768 Other specified abnormal immunological findings in serum: Secondary | ICD-10-CM | POA: Diagnosis not present

## 2023-11-04 DIAGNOSIS — E782 Mixed hyperlipidemia: Secondary | ICD-10-CM | POA: Diagnosis not present

## 2023-11-04 DIAGNOSIS — K219 Gastro-esophageal reflux disease without esophagitis: Secondary | ICD-10-CM | POA: Diagnosis not present

## 2023-11-04 DIAGNOSIS — I10 Essential (primary) hypertension: Secondary | ICD-10-CM | POA: Diagnosis not present

## 2023-11-04 DIAGNOSIS — R7303 Prediabetes: Secondary | ICD-10-CM | POA: Diagnosis not present

## 2023-11-04 DIAGNOSIS — N3000 Acute cystitis without hematuria: Secondary | ICD-10-CM | POA: Diagnosis not present

## 2023-11-26 DIAGNOSIS — Z1231 Encounter for screening mammogram for malignant neoplasm of breast: Secondary | ICD-10-CM | POA: Diagnosis not present

## 2023-12-02 NOTE — Progress Notes (Signed)
 Office Visit Note  Patient: Amy Cline             Date of Birth: [birth year]           MRN: [id number]             PCP: Norm Salt, PA Referring: Norm Salt, [place] Visit Date: 12/16/2023 Occupation: @GUAROCC @ Interpreter: Darlina Rumpf Rodriguez-Hernandez  Subjective:  Pain in multiple joints   History of Present Illness: Amy Cline is a 68 y.o. female seen for the evaluation of joint pain and positive ANA.  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  She had swelling in her left hand which lasted for a week and then resolved.  Since then she has been having pain and discomfort in her lower back.  She states the lower back pain has become severe over time.  She also have some discomfort in her neck.  Patient recalls seeing a physician after the ongoing neck and back pain.  I reviewed records and patient was evaluated by Dr. Lucia Gaskins.  She had MRI of the cervical spine which shows multilevel spondylosis and spinal stenosis.  She also had MRI of the lumbar spine which showed degenerative disc disease.  She was started on Lyrica and amitriptyline at the time.  She also had nerve conduction velocities and EMG for the right arm.  She was also evaluated by Dr. Pearletha Forge in March 2022 for lower back pain.  We also reviewed MRI of the neck and upper back.  He recommended neurosurgery evaluation and physical therapy.  She continues to have some discomfort in her hands and her feet.  She has not noticed any joint swelling.  She is left-handed.  She enjoys crochet and also walks for exercise.  She denies any history of oral ulcers, nasal ulcers, malar rash, photosensitivity, Raynaud's or lymphadenopathy.  There is no family history of autoimmune disease.  She is gravida 3, para 3.  There is no history of DVTs or preeclampsia.  She does not smoke.  She drinks alcohol only occasionally.    Activities of Daily Living:   Patient reports morning stiffness for 0 minutes.   Patient Reports nocturnal pain.  Difficulty dressing/grooming: Denies Difficulty climbing stairs: Reports Difficulty getting out of chair: Reports Difficulty using hands for taps, buttons, cutlery, and/or writing: Reports  Review of Systems  Constitutional:  Positive for fatigue.  HENT:  Negative for mouth sores and mouth dryness.   Eyes:  Negative for dryness.  Respiratory:  Negative for shortness of breath.   Cardiovascular:  Negative for chest pain and palpitations.  Gastrointestinal:  Negative for blood in stool, constipation and diarrhea.  Endocrine: Negative for increased urination.  Genitourinary:  Negative for involuntary urination.  Musculoskeletal:  Positive for joint pain and joint pain. Negative for gait problem, joint swelling, myalgias, muscle weakness, morning stiffness, muscle tenderness and myalgias.  Skin:  Negative for color change, rash, hair loss and sensitivity to sunlight.  Allergic/Immunologic: Negative for susceptible to infections.  Neurological:  Negative for dizziness and headaches.  Hematological:  Negative for swollen glands.  Psychiatric/Behavioral:  Positive for sleep disturbance. Negative for depressed mood. The patient is not nervous/anxious.     PMFS History:  Patient Active Problem List   Diagnosis Date Noted   LLQ pain 04/30/2022   Age-related osteoporosis without current pathological fracture 01/23/2021   Hyperlipidemia 01/23/2021   Impaired fasting glucose 01/23/2021  Insomnia 01/23/2021   Midline cystocele 01/23/2021   Varicose veins of bilateral lower extremities with pain 01/23/2021   Vitamin D deficiency 01/23/2021   Spinal stenosis in cervical region 01/19/2021   Language barrier 12/22/2020   Adnexal mass 12/19/2020   Lumbar radiculopathy 09/07/2020   Right arm pain 03/26/2019   Chronic migraine without aura without status migrainosus, not intractable 11/18/2018   Rectocele,  female 08/11/2018   Scalp tenderness 08/11/2018   Atypical chest pain 07/17/2016   Essential hypertension 03/29/2016   Fibromyalgia 03/29/2016   Gastroesophageal reflux disease without esophagitis 03/29/2016   Chronic tension-type headache, not intractable 08/16/2015    Past Medical History:  Diagnosis Date   Arthritis    Bil hands   Bladder prolapse, female, acquired    Breast mass    left   Chronic headaches    Coronary artery disease    Fibromyalgia    GERD (gastroesophageal reflux disease)    Glaucoma    Hyperlipemia    Hypertension    Imbalance    Insomnia    Osteoporosis    Paresthesia    Prediabetes    Vitamin D deficiency     Family History  Problem Relation Age of Onset   Hypertension Mother    Hypertension Father    Migraines Neg Hx    Past Surgical History:  Procedure Laterality Date   APPENDECTOMY     open. age 35   ARTERY BIOPSY Right 09/16/2018   Procedure: BIOPSY TEMPORAL ARTERY RIGHT;  Surgeon: Maeola Harman, MD;  Location: [facility];  Service: Vascular;  Laterality: Right;   BREAST BIOPSY     benign/left breast   COLONOSCOPY  07/2018   OVARIAN CYST SURGERY Right    in her 30s. cyst removal. ovary left   TOTAL VAGINAL HYSTERECTOMY  2008   for prolapse. in [place]   VAGINAL PROLAPSE REPAIR N/A 12/22/2019   Procedure: RECTOCELE REPAIR WITH GRAFT AND VAGINAL VAULT SUSPENSION/CYSTOSCOPY;  Surgeon: Alfredo Martinez, MD;  Location: [facility] ORS;  Service: Urology;  Laterality: N/A;   Social History   Social History Narrative   Lives at home alone   Caffeine use: 1 cup coffee in AM, sometimes 1 decaf cup in afternoon. Green tea.    Ambidextrous   Immunization History  Administered Date(s) Administered   PPD Test 05/26/2016   Tdap 05/23/2016, 10/11/2017     Objective: Vital Signs: BP 134/86 (BP Location: Right Arm, Patient Position: Sitting, Cuff Size: Large)   Pulse 64   Resp 14   Ht 5' 3.5" (1.613 m)   Wt 153 lb (69.4 kg)   BMI  26.68 kg/m    Physical Exam Vitals and nursing note reviewed.  Constitutional:      Appearance: She is well-developed.  HENT:     Head: Normocephalic and atraumatic.  Eyes:     Conjunctiva/sclera: Conjunctivae normal.  Cardiovascular:     Rate and Rhythm: Normal rate and regular rhythm.     Heart sounds: Normal heart sounds.  Pulmonary:     Effort: Pulmonary effort is normal.     Breath sounds: Normal breath sounds.  Abdominal:     General: Bowel sounds are normal.     Palpations: Abdomen is soft.  Musculoskeletal:     Cervical back: Normal range of motion.  Lymphadenopathy:     Cervical: No cervical adenopathy.  Skin:    General: Skin is warm and dry.     Capillary Refill: Capillary refill takes less than 2 seconds.  Neurological:     Mental Status: She is alert and oriented to person, place, and time.  Psychiatric:        Behavior: Behavior normal.      Musculoskeletal Exam: Cervical spine was in good range of motion.  She had painful range of motion of the lumbar spine without any point tenderness.  There was no SI joint tenderness.  Shoulders, elbows, wrist joints in good range of motion.  She had bilateral PIP and DIP thickening with no synovitis.  Hip joints were in good range of motion.  Knee joints with good range of motion without any warmth swelling or effusion.  There was no tenderness over ankles or MTPs.  CDAI Exam: CDAI Score: -- Patient Global: --; Provider Global: -- Swollen: --; Tender: -- Joint Exam 12/16/2023   No joint exam has been documented for this visit   There is currently no information documented on the homunculus. Go to the Rheumatology activity and complete the homunculus joint exam.  Investigation: No additional findings.  Imaging: No results found.  Recent Labs: Lab Results  Component Value Date   WBC 6.8 09/07/2020   HGB 13.4 09/07/2020   PLT 299 09/07/2020   NA 139 04/11/2022   K 3.9 04/11/2022   CL 102 04/11/2022   CO2 22  04/11/2022   GLUCOSE 94 04/11/2022   BUN 17 04/11/2022   CREATININE 0.84 04/11/2022   BILITOT 0.2 04/11/2022   ALKPHOS 90 04/11/2022   AST 28 04/11/2022   ALT 25 04/11/2022   PROT 7.1 04/11/2022   ALBUMIN 4.0 04/11/2022   CALCIUM 9.1 04/11/2022   GFRAA 86 09/07/2020   July 06, 2023 double-stranded DNA 1, ANA 1: 40 NS    IMPRESSION: Abnormal MRI scan cervical spine without contrast showing prominent spondylitic changes at C6-7 resulting in severe canal and bilateral left greater than right foraminal narrowing as well as moderate canal and bilateral foraminal narrowing at C5-6 as well. INTERPRETING PHYSICIAN:  PRAMOD SETHI, MD  IMPRESSION: Inferiorly migrated left L1-2 paracentral and L2-3 central extrusions. Mild L1-3 spinal canal and bilateral L2-3 neural foraminal narrowing.  Mild spinal canal and neural foraminal narrowing at the L4-5 level.  Mild right L5-S1 neural foraminal narrowing.  2.8 cm right hemipelvic cystic lesion is partially imaged and may involve the right adnexa. Consider dedicated pelvic ultrasound for further evaluation.  Electronically Signed   By: Stana Bunting M.D.   On: 08/13/2020 14:50  FINDINGS: There is no evidence of hip fracture or dislocation. There is no evidence of arthropathy or other focal bone abnormality.  IMPRESSION: Negative.  Electronically Signed   By: Lupita Raider M.D.   On: 06/22/2020 09:56  Speciality Comments: No specialty comments available.  Procedures:  No procedures performed Allergies: Patient has no known allergies.   Assessment / Plan:     Visit Diagnoses: Polyarthralgia-patient complains of pain and discomfort in multiple joints since her fall in 2021.  She states the pain is mostly in her neck and lower back.  She states the lower back pain is getting severe.  She tried physical therapy which did not help.  ANA positive-she was found to have low titer positive ANA.  The dsDNA was negative.  She has  no clinical features of lupus or related disorders.  She denies any history of oral ulcers, nasal ulcers, malar rash, photosensitivity, Raynaud's, lymphadenopathy or inflammatory arthritis.  Primary osteoarthritis of both hands-she has some stiffness in her hands.  Bilateral PIP and DIP thickening  with no synovitis was noted.  Detail counseled regarding osteoarthritis was provided.  Joint protection muscle strengthening was discussed.  A handout on hand exercises was given.  Spinal stenosis in cervical region-I reviewed neurology notes and also the MRI of her cervical spine which showed multilevel spondylosis and severe canal stenosis.  Previous neurology evaluation report and MRI of the cervical spine report were reviewed with the patient.  Patient may benefit from neurosurgery evaluation.  She had good range of motion of the cervical spine today.  Lumbar radiculopathy-she complains of severe lower back pain.  She had MRI of the lumbar spine.  The MRI report was reviewed with the patient.  She is on gabapentin and amitriptyline which was recommended by neurologist.  She states despite taking these medications and going to physical therapy she has had no relief in her lower back pain.  I will refer her to pain management.  Age-related osteoporosis without current pathological fracture-she is under care of her PCP.  Vitamin D deficiency  Fibromyalgia-he continues to have generalized pain and discomfort and hyperalgesia.  Primary insomnia-sleep hygiene was discussed.  Varicose veins of bilateral lower extremities with pain  Essential hypertension-blood pressure was normal today.  Mixed hyperlipidemia  Gastroesophageal reflux disease without esophagitis  Chronic migraine without aura without status migrainosus, not intractable  Language barrier-an interpreter was available throughout the visit.  Orders: No orders of the defined types were placed in this encounter.  No orders of the  defined types were placed in this encounter.   Face-to-face time spent with patient was 45 minutes. Greater than 50% of time was spent in counseling and coordination of care.  Follow-Up Instructions: Return if symptoms worsen or fail to improve, for Pain in multiple joints, +ANA.   Pollyann Savoy, MD  Note - This record has been created using Animal nutritionist.  Chart creation errors have been sought, but may not always  have been located. Such creation errors do not reflect on  the standard of medical care.

## 2023-12-10 DIAGNOSIS — G43111 Migraine with aura, intractable, with status migrainosus: Secondary | ICD-10-CM | POA: Diagnosis not present

## 2023-12-10 DIAGNOSIS — M797 Fibromyalgia: Secondary | ICD-10-CM | POA: Diagnosis not present

## 2023-12-10 DIAGNOSIS — R7303 Prediabetes: Secondary | ICD-10-CM | POA: Diagnosis not present

## 2023-12-10 DIAGNOSIS — R768 Other specified abnormal immunological findings in serum: Secondary | ICD-10-CM | POA: Diagnosis not present

## 2023-12-10 DIAGNOSIS — E559 Vitamin D deficiency, unspecified: Secondary | ICD-10-CM | POA: Diagnosis not present

## 2023-12-10 DIAGNOSIS — E782 Mixed hyperlipidemia: Secondary | ICD-10-CM | POA: Diagnosis not present

## 2023-12-10 DIAGNOSIS — I10 Essential (primary) hypertension: Secondary | ICD-10-CM | POA: Diagnosis not present

## 2023-12-10 DIAGNOSIS — K219 Gastro-esophageal reflux disease without esophagitis: Secondary | ICD-10-CM | POA: Diagnosis not present

## 2023-12-16 ENCOUNTER — Encounter: Payer: Self-pay | Admitting: Rheumatology

## 2023-12-16 ENCOUNTER — Ambulatory Visit: Payer: 59 | Attending: Rheumatology | Admitting: Rheumatology

## 2023-12-16 VITALS — BP 134/86 | HR 64 | Resp 14 | Ht 63.5 in | Wt 153.0 lb

## 2023-12-16 DIAGNOSIS — M19041 Primary osteoarthritis, right hand: Secondary | ICD-10-CM | POA: Diagnosis not present

## 2023-12-16 DIAGNOSIS — M810 Age-related osteoporosis without current pathological fracture: Secondary | ICD-10-CM

## 2023-12-16 DIAGNOSIS — M4802 Spinal stenosis, cervical region: Secondary | ICD-10-CM | POA: Diagnosis not present

## 2023-12-16 DIAGNOSIS — I10 Essential (primary) hypertension: Secondary | ICD-10-CM

## 2023-12-16 DIAGNOSIS — R768 Other specified abnormal immunological findings in serum: Secondary | ICD-10-CM

## 2023-12-16 DIAGNOSIS — M797 Fibromyalgia: Secondary | ICD-10-CM | POA: Diagnosis not present

## 2023-12-16 DIAGNOSIS — F5101 Primary insomnia: Secondary | ICD-10-CM

## 2023-12-16 DIAGNOSIS — R7689 Other specified abnormal immunological findings in serum: Secondary | ICD-10-CM

## 2023-12-16 DIAGNOSIS — E559 Vitamin D deficiency, unspecified: Secondary | ICD-10-CM

## 2023-12-16 DIAGNOSIS — E782 Mixed hyperlipidemia: Secondary | ICD-10-CM | POA: Diagnosis not present

## 2023-12-16 DIAGNOSIS — M5416 Radiculopathy, lumbar region: Secondary | ICD-10-CM | POA: Diagnosis not present

## 2023-12-16 DIAGNOSIS — Z603 Acculturation difficulty: Secondary | ICD-10-CM

## 2023-12-16 DIAGNOSIS — G43709 Chronic migraine without aura, not intractable, without status migrainosus: Secondary | ICD-10-CM

## 2023-12-16 DIAGNOSIS — Z758 Other problems related to medical facilities and other health care: Secondary | ICD-10-CM

## 2023-12-16 DIAGNOSIS — K219 Gastro-esophageal reflux disease without esophagitis: Secondary | ICD-10-CM

## 2023-12-16 DIAGNOSIS — I83813 Varicose veins of bilateral lower extremities with pain: Secondary | ICD-10-CM | POA: Diagnosis not present

## 2023-12-16 DIAGNOSIS — M19042 Primary osteoarthritis, left hand: Secondary | ICD-10-CM

## 2023-12-16 DIAGNOSIS — M2550 Pain in unspecified joint: Secondary | ICD-10-CM

## 2023-12-16 NOTE — Addendum Note (Signed)
 Addended by: Geroge Baseman C on: 12/16/2023 12:07 PM   Modules accepted: Orders

## 2023-12-16 NOTE — Patient Instructions (Addendum)
 Ejercicios de [employer] Los ejercicios de manos pueden ser tiles para Research officer, trade union. Pueden fortalecer las manos y mejorar el movimiento y la flexibilidad. Los ejercicios tambin pueden aumentar el flujo sanguneo a las manos. Estos resultados [employer] sea ms fcil encargarse del trabajo y las tareas diarias. Los ejercicios de manos pueden ser especialmente provechosos para la gente que tiene dolor articular provocado por la artritis o tienen dao nervioso por usar las manos continuamente. Estos ejercicios tambin pueden ayudar a las personas que se lesionan [place]. Ejercicios La mayor parte de estos ejercicios de manos son suaves ejercicios de elongacin y [place]. Generalmente es seguro hacerlos durante todo Medical laboratory scientific officer. Calentarse las manos antes del ejercicio puede ayudar a reducir la rigidez. Puede hacer esto con un masaje suave o sumergiendo las manos en agua tibia durante 10 a 15 minutos. Es normal sentir Science writer, tironeo, opresin o un leve malestar cuando comienza a Biochemist, clinical. Con el tiempo, esto mejorar. Recuerde siempre tener cuidado y detenerse de inmediato si siente un dolor repentino y muy intenso o si el dolor empeora. Usted debe mejorar y [employer]. Pregunte al mdico qu ejercicios son seguros para usted. Haga los ejercicios exactamente como se lo haya indicado el mdico y gradelos como se lo hayan indicado. No comience a hacer estos ejercicios hasta que se lo indique el mdico. Flexin de nudillos o puo "garra"  Prese o sintese con el brazo, la mano y los cinco dedos apuntando [place]. Asegrese de [employer] en posicin recta. Flexione suavemente los dedos hacia abajo, hacia la palma de la mano, hasta que las puntas de los dedos toquen la palma de la [place]. Mantenga el nudillo grande estirado y solamente flexione los nudillos pequeos en los dedos. Mantenga esta posicin durante 10 segundos. Estire  los dedos nuevamente hasta la posicin inicial. Repita este ejercicio entre 5 y 10 veces con cada mano. Puo con todo el dedo  Prese o sintese con el brazo, la mano y los cinco dedos apuntando [place]. Asegrese de [employer] en posicin recta. Flexione suavemente los dedos [employer] palma de la mano, hasta que las puntas de los dedos toquen la parte media de la palma de la [place]. Mantenga esta posicin durante 10 segundos. Extienda los dedos nuevamente hasta la posicin inicial, estirando completamente cada articulacin. Repita este ejercicio entre 5 y 10 veces con cada mano. Puo extendido  Prese o sintese con el brazo, la mano y los cinco dedos apuntando [place]. Asegrese de [employer] en posicin recta. Flexione suavemente los dedos en el nudillo grande, donde los dedos se unen a la [place], y en el [place]. Mantenga el nudillo de la punta de los dedos estirado y trate de tocarse la parte inferior de la palma de la [place]. Mantenga esta posicin durante 10 segundos. Extienda los dedos nuevamente hasta la posicin inicial, estirando completamente cada articulacin. Repita este ejercicio entre 5 y 10 veces con cada mano. La mesa  Prese o sintese con el brazo, la mano y los cinco dedos apuntando [place]. Asegrese de [employer] en posicin recta. Doble suavemente los dedos en el nudillo ms grande, donde los dedos se unen a la mano, lo ms lejos que pueda hacia abajo. Mantenga estirados los nudillos pequeos de los dedos. Piense en formar una mesa con los dedos. Mantenga esta posicin durante 10 segundos. Extienda los dedos nuevamente hasta la posicin inicial, estirando completamente  cada articulacin. Repita este ejercicio entre 5 y 10 veces con cada mano. Dispersin de dedos  Apoye la mano horizontal sobre una mesa con la palma [place]. Asegrese de que la [place] se mantenga en posicin recta. Separe todos los dedos uno de otro tanto como  pueda hasta sentir un ligero estiramiento. Mantenga esta posicin durante 10 segundos. Vuelva a juntar todos los dedos. Mantenga esta posicin durante 10 segundos. Repita este ejercicio entre 5 y 10 veces con cada mano. Hacer crculos  Prese o sintese con el brazo, la mano y los cinco dedos apuntando [place]. Asegrese de [employer] en posicin recta. Haga un crculo tocando la punta del pulgar con la punta del dedo ndice. Mantenga durante 10 segundos. Luego abra la mano completamente. Repita este movimiento con Multimedia programmer y cada uno de los otros dedos. Repita este ejercicio entre 5 y 10 veces con cada mano. Movimiento del pulgar  Sintese con el antebrazo apoyado sobre una mesa y la [place]. El pulgar debe apuntar hacia arriba, hacia el techo. Mantenga los otros dedos relajados mientras mueve el pulgar. [place] el pulgar hacia arriba lo ms alto que pueda hacia el techo. Mantenga durante 10 segundos. Doble el pulgar por la palma lo ms lejos que pueda, tratando de llegar con la punta del pulgar hasta el lado del dedo pequeo (meique) de la palma de la [place]. Mantenga durante 10 segundos. Repita este ejercicio entre 5 y 10 veces con cada mano. Fortalecimiento del agarre  Mudlogger pelota para el estrs u otra pelota blanda en el medio de la [place]. Aumente lentamente la presin, apretando la pelota tanto como pueda sin Programmer, multimedia. Piense en llevar las puntas de los dedos hacia el centro de la palma. Todas las articulaciones de los dedos deben doblarse al hacer este ejercicio. Mantenga la presin durante 10 segundos y luego relaje. Repita este ejercicio entre 5 y 10 veces con cada mano. Comunquese con un mdico si: El dolor o las molestias en la mano se vuelven mucho ms intensas cuando hace un ejercicio. El dolor o las molestias en la mano no mejoran en el trmino de las 2 horas posteriores a Copy. Si tiene alguno de [employer], deje de [employer] ejercicios de inmediato. No vuelva a hacerlos a menos que el mdico lo autorice. Solicite ayuda de inmediato si: Presenta hinchazn o dolor sbito e intenso en la mano. Si esto ocurre, deje de Museum/gallery exhibitions officer ejercicios de inmediato. No vuelva a hacerlos a menos que el mdico lo autorice. Esta informacin no tiene Theme park manager el consejo del mdico. Asegrese de hacerle al mdico cualquier pregunta que tenga. Document Revised: 11/10/2022 Document Reviewed: 11/10/2022 Elsevier Patient Education  2024 [employer].

## 2023-12-26 DIAGNOSIS — M797 Fibromyalgia: Secondary | ICD-10-CM | POA: Diagnosis not present

## 2023-12-26 DIAGNOSIS — K219 Gastro-esophageal reflux disease without esophagitis: Secondary | ICD-10-CM | POA: Diagnosis not present

## 2023-12-26 DIAGNOSIS — G43111 Migraine with aura, intractable, with status migrainosus: Secondary | ICD-10-CM | POA: Diagnosis not present

## 2023-12-26 DIAGNOSIS — I10 Essential (primary) hypertension: Secondary | ICD-10-CM | POA: Diagnosis not present

## 2023-12-26 DIAGNOSIS — E559 Vitamin D deficiency, unspecified: Secondary | ICD-10-CM | POA: Diagnosis not present

## 2023-12-26 DIAGNOSIS — R7303 Prediabetes: Secondary | ICD-10-CM | POA: Diagnosis not present

## 2023-12-26 DIAGNOSIS — R768 Other specified abnormal immunological findings in serum: Secondary | ICD-10-CM | POA: Diagnosis not present

## 2023-12-26 DIAGNOSIS — E782 Mixed hyperlipidemia: Secondary | ICD-10-CM | POA: Diagnosis not present

## 2024-01-07 ENCOUNTER — Ambulatory Visit: Payer: 59 | Admitting: Rheumatology

## 2024-01-08 ENCOUNTER — Encounter: Payer: Self-pay | Admitting: Physical Medicine & Rehabilitation

## 2024-03-06 ENCOUNTER — Encounter: Admitting: Physical Medicine & Rehabilitation

## 2024-03-12 DIAGNOSIS — R7303 Prediabetes: Secondary | ICD-10-CM | POA: Diagnosis not present

## 2024-03-12 DIAGNOSIS — R768 Other specified abnormal immunological findings in serum: Secondary | ICD-10-CM | POA: Diagnosis not present

## 2024-03-12 DIAGNOSIS — Z0000 Encounter for general adult medical examination without abnormal findings: Secondary | ICD-10-CM | POA: Diagnosis not present

## 2024-03-12 DIAGNOSIS — E782 Mixed hyperlipidemia: Secondary | ICD-10-CM | POA: Diagnosis not present

## 2024-03-12 DIAGNOSIS — E559 Vitamin D deficiency, unspecified: Secondary | ICD-10-CM | POA: Diagnosis not present

## 2024-03-12 DIAGNOSIS — L2084 Intrinsic (allergic) eczema: Secondary | ICD-10-CM | POA: Diagnosis not present

## 2024-03-12 DIAGNOSIS — K219 Gastro-esophageal reflux disease without esophagitis: Secondary | ICD-10-CM | POA: Diagnosis not present

## 2024-03-12 DIAGNOSIS — I10 Essential (primary) hypertension: Secondary | ICD-10-CM | POA: Diagnosis not present

## 2024-03-12 DIAGNOSIS — M797 Fibromyalgia: Secondary | ICD-10-CM | POA: Diagnosis not present

## 2024-03-12 DIAGNOSIS — G43111 Migraine with aura, intractable, with status migrainosus: Secondary | ICD-10-CM | POA: Diagnosis not present

## 2024-03-18 ENCOUNTER — Ambulatory Visit (INDEPENDENT_AMBULATORY_CARE_PROVIDER_SITE_OTHER): Admitting: Nurse Practitioner

## 2024-03-18 ENCOUNTER — Encounter (HOSPITAL_BASED_OUTPATIENT_CLINIC_OR_DEPARTMENT_OTHER): Payer: Self-pay | Admitting: Nurse Practitioner

## 2024-03-18 VITALS — BP 118/78 | HR 78 | Ht 63.0 in | Wt 157.4 lb

## 2024-03-18 DIAGNOSIS — I10 Essential (primary) hypertension: Secondary | ICD-10-CM

## 2024-03-18 DIAGNOSIS — R921 Mammographic calcification found on diagnostic imaging of breast: Secondary | ICD-10-CM

## 2024-03-18 DIAGNOSIS — E785 Hyperlipidemia, unspecified: Secondary | ICD-10-CM

## 2024-03-18 DIAGNOSIS — R0602 Shortness of breath: Secondary | ICD-10-CM | POA: Diagnosis not present

## 2024-03-18 DIAGNOSIS — R072 Precordial pain: Secondary | ICD-10-CM | POA: Diagnosis not present

## 2024-03-18 MED ORDER — METOPROLOL TARTRATE 50 MG PO TABS: ORAL_TABLET | ORAL | 0 refills | Status: AC

## 2024-03-18 NOTE — Progress Notes (Signed)
 Cardiology Office Note:  .   Date:  03/18/2024 ID:  Amy Cline, DOB [birth year], MRN [id number] PCP: Dianah Fort, PA [facility] Providers Cardiologist:  None   Patient Profile: .      PMH Hyperlipidemia Elevated A1C (6.1 on 10/21/23) Hypertension Calcification noted on mammogram Vertigo Syncope Left internal carotid artery stenosis 40 to 59% on ultrasound 2019 Laser ablation of right great saphenous vein    Previously seen by Dr. Katheryne Pane in 2017 for non-exertional chest pain and by Dr. Ardell Beauvais in 2021 following 3 syncopal events.  She forted then on 06/23/2020 she was walking to the bathroom and just remembers waking up on the floor.  No preceding chest pain, palpitations, SOB, nausea or vomiting.  Had 2 other episodes similar over the previous year.  Extensive workup including Myoview , TTE, and cardiac monitor were reassuringly normal (had 6 episodes of SVT with longest lasting 6 beats).  Palpitations significantly improved on metoprolol .  History of Present Illness Amy Cline is a very pleasant 68 year old female who is here today for evaluation following identification of calcification on mammogram.  Spanish interpreter Fernand Howard is assisting with the visit.  Patient reports she was referred by her doctor due to chest pain when breathing.  She describes a pinching chest pain triggered by deep breathing, though it is not constant. Shortness of breath occurs when climbing stairs but not during normal walking. Occasional leg swelling is noted. She denies orthopnea or paroxysmal nocturnal dyspnea. She has a history of tachycardia and underwent catheterization in 2006, with no subsequent palpitations or abnormal heartbeats. Cath records not available for review. She denies recent presyncope or syncope. She takes atorvastatin  daily for cholesterol management but has not taken a recent prescription due to a change in appearance of the medication. She states she was told her  cholesterol levels were good.    Family history: Her family history includes Hypertension in her father and mother.   Discussed the use of AI scribe software for clinical note transcription with the patient, who gave verbal consent to proceed.   No results found for: "LIPOA"    ROS: See HPI       Studies Reviewed: Aaron Aas   EKG Interpretation Date/Time:  Wednesday March 18 2024 15:19:29 EDT Ventricular Rate:  78 PR Interval:  158 QRS Duration:  68 QT Interval:  378 QTC Calculation: 430 R Axis:   71  Text Interpretation: Normal sinus rhythm Low voltage QRS When compared with ECG of 14-Dec-2019 10:58, No significant change was found Confirmed by Slater Duncan [phone]) on 03/18/2024 3:41:25 PM      Risk Assessment/Calculations:             Physical Exam:   VS: BP 118/78   Pulse 78   Ht 5\' 3"  (1.6 m)   Wt 157 lb 6.4 oz (71.4 kg)   SpO2 99%   BMI 27.88 kg/m   Wt Readings from Last 3 Encounters:  03/18/24 157 lb 6.4 oz (71.4 kg)  12/16/23 153 lb (69.4 kg)  05/03/22 154 lb (69.9 kg)     GEN: Well nourished, well developed in no acute distress NECK: No JVD; No carotid bruits CARDIAC: RRR, no murmurs, rubs, gallops RESPIRATORY:  Clear to auscultation without rales, wheezing or rhonchi  ABDOMEN: Soft, non-tender, non-distended EXTREMITIES:  Mild bilateral LE edema; No deformity     Assessment & Plan Chest pain with breathing and shortness of breath Intermittent pinching chest pain with breathing and shortness of breath.  She has occasional LE edema but no weight gain, orthopnea, or PND.  Getting coronary CTA for evaluation of ischemia given symptoms of chest pain and shortness of breath as well as additional risk factors of history of hyperlipidemia, hypertension, and advanced age.  Will have her take Lopressor  50 mg 2 hours prior to test.  We will recheck lipids today. Focus on heart healthy mostly plant based diet avoiding saturated fat, processed foods, simple carbohydrates,  and sugar along with aiming for at least 150 minutes of moderate intensity exercise each week.   Calcification on Mammogram Arterial calcification noted on recent mammogram.  Reassurance provided. We will get coronary CTA for evaluation of coronary calcium . Continue ASCVD risk reduction.   Hypertension BP initially elevated but improved on my recheck. No change in anti-hypertensive therapy today.   Hyperlipidemia Most recent lipid panel I can find is from 06/2023 with total cholesterol 234, LDL 154, HDL 58, and triglycerides 657.  She states she was recently told that her cholesterol was well-controlled.  She reports compliance with atorvastatin , but states pills look different in her recent Rx. It is unclear if the dose was increased.  She is currently fasting, so we will get lipid panel, CMET, and LP(a).  Continue atorvastatin .  Anticipate LDL goal will be 70 or lower if coronary calcium  is identified.   I spent 40  minutes today with this patient including discussion with patient and interpretor about cardiac concerns, in review of records and recent labs, consideration of future appointments and testing, and documentation of my findings in the note.      Dispo: 3-4 months with me  Signed, Slater Duncan, NP-C

## 2024-03-18 NOTE — Patient Instructions (Addendum)
 Instrucciones para la medicacin:  Su mdico le recomienda que contine con sus medicamentos actuales segn las indicaciones. Consulte la lista de medicamentos actuales que le entregaron hoy.  *Si necesita resurtir sus medicamentos para el corazn antes de su prxima cita, llame a su farmacia*  Lab Work:  [employer]!!! El paciente lo hizo mientras le daba instrucciones.  Si hoy le hacen anlisis de sangre y sus resultados son completamente normales, recibir los [place] solo por:  Mensaje de MyChart (si tiene MyChart) Deanna Expose  Una copia impresa por correo. Si algn anlisis de laboratorio es anormal o necesitamos cambiar su tratamiento, le llamaremos para revisar los [place].  Testing/Procedures:    Su tomografa computarizada cardaca se programar en una de las siguientes ubicaciones:  [facility] [address] [place], [place] [address] Inauguracin: 28 de abril de 2025  Si su cita es en la [facility] de [employer], ingrese al estacionamiento por la entrada de [address] y [place] el servicio de Scientist, water quality parking gratuito en la zona de descenso de [place]. Ingrese al edificio y regstrese en la planta baja.  Siga estas instrucciones cuidadosamente (a menos que se le indique lo contrario):  Se requerir una va intravenosa para esta prueba y se administrar nitroglicerina.  La noche anterior a la prueba:  Asegrese de beber abundante agua.  No consuma bebidas con cafena/descafeinadas ni chocolate 12 horas antes de la prueba.  No tome antihistamnicos 12 horas antes de la prueba.  El da de la prueba:  Beba abundante agua hasta 1 hora antes de la prueba.  No consuma alimentos 1 hora antes de la prueba.  Puede tomar sus medicamentos habituales antes de la prueba.  Tome una (1) tableta de metoprolol  (50 mg) (Lopressor ) por va oral dos horas antes de la prueba.  MUJERES: use sostn sin aros si tiene, evite vestidos y ropa  ajustada.      Despus de la prueba:  Regina Capes agua.  Despus de recibir [employer], puede experimentar una leve sensacin de enrojecimiento. Esto es normal.  En ocasiones, puede experimentar una erupcin cutnea leve hasta 24 horas despus de la prueba. Esto no es peligroso. Si esto ocurre, puede tomar Benadryl  25 mg, Zyrtec, Claritin o Allegra y aumentar la ingesta de lquidos. (Los pacientes que toman Tikosyn deben Automotive engineer Benadryl  y pueden tomar Zyrtec, Claritin o Allegra).  Si experimenta dificultad para respirar, esto puede ser grave. Si es grave, llame al 911 INMEDIATAMENTE. Si es leve, llame a nuestro consultorio.  Le llamaremos para programar su prueba con 2 a 4 semanas de anticipacin, teniendo en cuenta que algunas compaas de seguros requieren una autorizacin previa al servicio.  Para obtener ms informacin y preguntas frecuentes, visite nuestro sitio web: http://kemp.com/  Si tiene alguna pregunta o inquietud, comunquese con la enfermera coordinadora de imgenes cardacas: Enfermeras Coordinadoras de Imgenes Cardacas Llamada directa a la oficina: [phone]  Para programar su cita, incluyendo cancelaciones y reprogramaciones, llame a [place] al [phone].  Seguimiento: En [employer], usted y sus necesidades de salud son Bonnell Butcher prioridad. Como parte de nuestra misin continua de brindarle una atencin cardaca excepcional, nuestros profesionales forman parte de un mismo equipo. Este equipo incluye a su cardilogo de cabecera (mdico) y profesionales de la salud de prctica avanzada (APP) (asistentes mdicos y enfermeras practicantes), quienes trabajan en conjunto para brindarle la atencin que necesita, cuando la necesita.  Su prxima cita:  3 meses  Proveedor:  Slater Duncan, NP  Le recomendamos registrarse en el portal para pacientes "MyChart". La informacin de Materials engineer en este Resumen Posterior a la  Visita. MyChart se utiliza para conectar con pacientes para consultas virtuales (telemedicina). Los pacientes pueden ver resultados de anlisis de laboratorio, notas de encuentros, prximas citas, etc. Tambin puede enviar mensajes no urgentes a su proveedor. Para obtener ms informacin sobre las funciones de MyChart, visite [email or web address].

## 2024-03-19 LAB — COMPREHENSIVE METABOLIC PANEL WITH GFR
ALT: 19 IU/L (ref 0–32)
AST: 24 IU/L (ref 0–40)
Albumin: 4.2 g/dL (ref 3.9–4.9)
Alkaline Phosphatase: 132 IU/L — ABNORMAL HIGH (ref 44–121)
BUN/Creatinine Ratio: 21 (ref 12–28)
BUN: 19 mg/dL (ref 8–27)
Bilirubin Total: <0.2 mg/dL (ref 0.0–1.2)
CO2: 23 mmol/L (ref 20–29)
Calcium: 9.1 mg/dL (ref 8.7–10.3)
Chloride: 102 mmol/L (ref 96–106)
Creatinine, Ser: 0.89 mg/dL (ref 0.57–1.00)
Globulin, Total: 3.3 g/dL (ref 1.5–4.5)
Glucose: 90 mg/dL (ref 70–99)
Potassium: 4.5 mmol/L (ref 3.5–5.2)
Sodium: 139 mmol/L (ref 134–144)
Total Protein: 7.5 g/dL (ref 6.0–8.5)
eGFR: 70 mL/min/{1.73_m2} (ref >59)

## 2024-03-19 LAB — LIPID PANEL
Chol/HDL Ratio: 2.9 ratio (ref 0.0–4.4)
Cholesterol, Total: 171 mg/dL (ref 100–199)
HDL: 59 mg/dL (ref >39)
LDL Chol Calc (NIH): 84 mg/dL (ref 0–99)
Triglycerides: 166 mg/dL — ABNORMAL HIGH (ref 0–149)
VLDL Cholesterol Cal: 28 mg/dL (ref 5–40)

## 2024-03-19 LAB — LIPOPROTEIN A (LPA): Lipoprotein (a): 20.6 nmol/L (ref <75.0)

## 2024-03-23 ENCOUNTER — Ambulatory Visit (HOSPITAL_BASED_OUTPATIENT_CLINIC_OR_DEPARTMENT_OTHER): Payer: Self-pay | Admitting: Nurse Practitioner

## 2024-03-23 ENCOUNTER — Other Ambulatory Visit (HOSPITAL_BASED_OUTPATIENT_CLINIC_OR_DEPARTMENT_OTHER): Payer: Self-pay | Admitting: *Deleted

## 2024-03-23 DIAGNOSIS — R921 Mammographic calcification found on diagnostic imaging of breast: Secondary | ICD-10-CM

## 2024-03-23 DIAGNOSIS — E785 Hyperlipidemia, unspecified: Secondary | ICD-10-CM

## 2024-03-23 MED ORDER — ROSUVASTATIN CALCIUM 20 MG PO TABS: 20.0000 mg | ORAL_TABLET | Freq: Every day | ORAL | 3 refills | Status: AC

## 2024-04-06 ENCOUNTER — Telehealth (HOSPITAL_COMMUNITY): Payer: Self-pay | Admitting: *Deleted

## 2024-04-06 NOTE — Telephone Encounter (Signed)
 Attempted to call patient regarding upcoming cardiac CT appointment (via interpreter ID # [phone]). Left message on voicemail with name and callback number Sid Seats RN Navigator Cardiac Imaging [facility] [phone] Office

## 2024-04-06 NOTE — Telephone Encounter (Signed)
 Reaching out to patient to offer assistance regarding upcoming cardiac imaging study (via interpreter ID # [phone]); pt verbalizes understanding of appt date/time, parking situation and where to check in, pre-test NPO status and medications ordered, and verified current allergies; name and call back number provided for further questions should they arise Sid Seats RN Navigator Cardiac Imaging [facility] [phone] office [phone] cell

## 2024-04-07 ENCOUNTER — Ambulatory Visit (HOSPITAL_COMMUNITY)
Admission: RE | Admit: 2024-04-07 | Discharge: 2024-04-07 | Disposition: A | Discharge status: Home / Self Care | Source: Ambulatory Visit | Attending: Nurse Practitioner | Admitting: Nurse Practitioner

## 2024-04-07 DIAGNOSIS — I2510 Atherosclerotic heart disease of native coronary artery without angina pectoris: Secondary | ICD-10-CM | POA: Diagnosis not present

## 2024-04-07 DIAGNOSIS — E785 Hyperlipidemia, unspecified: Secondary | ICD-10-CM | POA: Insufficient documentation

## 2024-04-07 DIAGNOSIS — I10 Essential (primary) hypertension: Secondary | ICD-10-CM | POA: Diagnosis not present

## 2024-04-07 DIAGNOSIS — I700 Atherosclerosis of aorta: Secondary | ICD-10-CM

## 2024-04-07 DIAGNOSIS — R0602 Shortness of breath: Secondary | ICD-10-CM | POA: Diagnosis not present

## 2024-04-07 MED ORDER — NITROGLYCERIN 0.4 MG SL SUBL: 0.8000 mg | SUBLINGUAL_TABLET | Freq: Once | SUBLINGUAL | Status: DC

## 2024-04-07 MED ORDER — NITROGLYCERIN 0.4 MG SL SUBL
SUBLINGUAL_TABLET | SUBLINGUAL | Status: AC
  Filled 2024-04-07: qty 2

## 2024-04-07 MED ORDER — IOHEXOL 350 MG/ML SOLN
100.0000 mL | Freq: Once | INTRAVENOUS | Status: AC | PRN
  Administered 2024-04-07: 100 mL via INTRAVENOUS

## 2024-04-08 ENCOUNTER — Encounter: Attending: Physical Medicine & Rehabilitation | Admitting: Physical Medicine & Rehabilitation

## 2024-04-08 ENCOUNTER — Encounter: Payer: Self-pay | Admitting: Physical Medicine & Rehabilitation

## 2024-04-08 DIAGNOSIS — G44229 Chronic tension-type headache, not intractable: Secondary | ICD-10-CM | POA: Diagnosis not present

## 2024-04-08 DIAGNOSIS — G43709 Chronic migraine without aura, not intractable, without status migrainosus: Secondary | ICD-10-CM | POA: Diagnosis not present

## 2024-04-08 DIAGNOSIS — R29898 Other symptoms and signs involving the musculoskeletal system: Secondary | ICD-10-CM | POA: Diagnosis not present

## 2024-04-08 DIAGNOSIS — M5416 Radiculopathy, lumbar region: Secondary | ICD-10-CM | POA: Insufficient documentation

## 2024-04-08 DIAGNOSIS — R202 Paresthesia of skin: Secondary | ICD-10-CM | POA: Insufficient documentation

## 2024-04-08 DIAGNOSIS — R200 Anesthesia of skin: Secondary | ICD-10-CM | POA: Insufficient documentation

## 2024-04-08 MED ORDER — GABAPENTIN 600 MG PO TABS
600.0000 mg | ORAL_TABLET | Freq: Three times a day (TID) | ORAL | 3 refills | Status: AC
Start: 2024-04-08 — End: ?

## 2024-04-08 MED ORDER — AMITRIPTYLINE HCL 50 MG PO TABS
50.0000 mg | ORAL_TABLET | Freq: Every day | ORAL | 4 refills | Status: AC
Start: 2024-04-08 — End: ?

## 2024-04-08 NOTE — Patient Instructions (Addendum)
 ALWAYS FEEL FREE TO CALL OUR OFFICE WITH ANY PROBLEMS OR QUESTIONS [phone])  **PLEASE NOTE** ALL MEDICATION REFILL REQUESTS (INCLUDING CONTROLLED SUBSTANCES) NEED TO BE MADE AT LEAST 7 DAYS PRIOR TO REFILL BEING DUE. ANY REFILL REQUESTS INSIDE THAT TIME FRAME MAY RESULT IN DELAYS IN RECEIVING YOUR PRESCRIPTION.    YOU NEED TO INCREASE AEROBIC EXERCISE!  THAT MEANS WALKING MORE, GETTING TO THE [employer]. ANY ACTIVITY WHICH INCREASES YOUR HEART RATE.   -TARGET SHOULD BE AT LEAST 30 MINUTES PER DAY AT LEAST 3 DAYS PER WEEK  -IDEALLY YOU SHOULD BE EXERCISING 5 DAYS PER WEEK FOR 30-45 MINUTES  -WALKING, POOL ACTIVITIES, YOGA/CLASSES AT [employer], STATIONARY BIKE   MEDICATIONS: THIS WEEK: BEGIN AMITRIPTYLINE  50MG  AT BEDTIME NEXT WEDNESDAY BEGIN INCREASING GABAPENTIN  AS FOLLOWS:  FOR 3 DAYS 400MG - 400MG - 600MG              THE NEXT 3 DAYS 600-400-600MG   AFTER THAT INCREASE TO 600MG  THREE X DAILY

## 2024-04-08 NOTE — Progress Notes (Signed)
 Subjective:    Patient ID: Amy Cline, female    DOB: January 07, 1956, 68 y.o.   MRN: [id number]  HPI  This is initial evaluation for Amy Cline who is a 68 year old female with a history of migraines and ?FMS who has complained of increased generalized pain since 2021 when she fell while working in [employer] and landed on her left hand.  She is complaining of ongoing pain since then in her neck and low back.  She was seen by neurology as well as rheumatology given positive ANA testing.  MRI of the cervical spine demonstrates multiple level spondylosis with spinal stenosis.  MRI of the lumbar spine demonstrates degenerative disc disease.  She was placed on Lyrica  and amitriptyline  initially.  She had an EMG of her right upper extremity.  I am not aware what was found. she was seen by Dr. Cleatrice who performed a right L5-S1 ESI which did not provide benefit.  He recommended neurosurgical evaluation due to his severe spinal stenosis at C6-C7.  Dr. Dolphus felt the patient's arthritis was not consistent with an inflammatory process.  I reviewed her MRIs myself.  MRI of the cervical spine from 11/10/2020: C5-C6 there is a broad-based central disc protrusion causing moderate central and foraminal narrowing.  At C6-C7 there is a large disc protrusion centrally causing severe canal stenosis.  There are also some less significant disks findings superiorly.  MRI of the lumbar spine from 08/12/2020: Study demonstrates multilevel disc disease with disc extrusion at L1-L2 as well as L2-L3 as well as a L5-S1 disc bulge with shallow central protrusion, left paracentral annular fissuring, ligamentum flavum and bilateral facet hypertrophy.  Mild right neural foramina narrowing is noted.  She was seen by Dr. Colon? Who apparently recommended that she have surgery. I'm not sure what surgery was recommended.   She was told by her family doctor that she had fibromyalgia and hence she was referred her.  Apparently she was also told this when she lived in [place].   She has ongoing discomfort in her hands and feet as well as her axial spine.   Sleep is broken and may be 2-3 hours at a time before she wakes up due to pain in her shoulders and back.   For exercise she only walks around the house and really does nothing else. She went for PT on [facility] with some benefit. She does her HEP inconsistently at home, maybe once per week. She joined the [employer] and hasn't been there yet.   Amy Cline denies depression.   Currently for pain she is taking amitriptyline  25 mg at bedtime, Voltaren  gel daily, gabapentin  400 mg 3 times daily.   Pain Inventory Average Pain 10 Pain Right Now 10 My pain is constant, sharp, stabbing, tingling, aching, and Numbnesds  In the last 24 hours, has pain interfered with the following? General activity 7 Relation with others 10 Enjoyment of life 10 What TIME of day is your pain at its worst? night Sleep (in general) Poor  Pain is worse with: walking, bending, sitting, inactivity, standing, and some activites Pain improves with: medication Relief from Meds: 2  walk without assistance ability to climb steps?  yes do you drive?  yes  not employed: date last employed 06/07/2021  bladder control problems numbness tremor tingling trouble walking spasms dizziness confusion anxiety  Any changes since last visit?  no  Any changes since last visit?  no    Family History  Problem Relation Age of  Onset   Hypertension Mother    Hypertension Father    Migraines Neg Hx    Social History   Socioeconomic History   Marital status: Legally Separated    Spouse name: Debby   Number of children: 2   Years of education: 16   Highest education level: Not on file  Occupational History   Occupation: Retired  Tobacco Use   Smoking status: Former    Types: Cigarettes    Passive exposure: Past   Smokeless tobacco: Never  Vaping Use   Vaping status: Never  Used  Substance and Sexual Activity   Alcohol use: Not Currently    Comment: SOCIAL   Drug use: No   Sexual activity: Yes    Birth control/protection: Surgical  Other Topics Concern   Not on file  Social History Narrative   Lives at home alone   Caffeine use: 1 cup coffee in AM, sometimes 1 decaf cup in afternoon. Green tea.    Ambidextrous   Social Drivers of Corporate investment banker Strain: Low Risk  (03/18/2024)   Overall Financial Resource Strain (CARDIA)    Difficulty of Paying Living Expenses: Not hard at all  Food Insecurity: No Food Insecurity (03/18/2024)   Hunger Vital Sign    Worried About Running Out of Food in the Last Year: Never true    Ran Out of Food in the Last Year: Never true  Transportation Needs: No Transportation Needs (04/30/2022)   PRAPARE - Administrator, Civil Service (Medical): No    Lack of Transportation (Non-Medical): No  Physical Activity: Unknown (03/18/2024)   Exercise Vital Sign    Days of Exercise per Week: 7 days    Minutes of Exercise per Session: Not on file  Stress: Not on file  Social Connections: Socially Isolated (03/18/2024)   Social Connection and Isolation Panel    Frequency of Communication with Friends and Family: More than three times a week    Frequency of Social Gatherings with Friends and Family: Twice a week    Attends Religious Services: Never    Database administrator or Organizations: No    Attends Banker Meetings: Never    Marital Status: Separated   Past Surgical History:  Procedure Laterality Date   APPENDECTOMY     open. age 78   ARTERY BIOPSY Right 09/16/2018   Procedure: BIOPSY TEMPORAL ARTERY RIGHT;  Surgeon: Sheree Penne Bruckner, MD;  Location: [facility] OR;  Service: Vascular;  Laterality: Right;   BREAST BIOPSY     benign/left breast   COLONOSCOPY  07/2018   OVARIAN CYST SURGERY Right    in her 30s. cyst removal. ovary left   TOTAL VAGINAL HYSTERECTOMY  2008   for prolapse. in  [place]   VAGINAL PROLAPSE REPAIR N/A 12/22/2019   Procedure: RECTOCELE REPAIR WITH GRAFT AND VAGINAL VAULT SUSPENSION/CYSTOSCOPY;  Surgeon: Gaston Hamilton, MD;  Location: [facility] ORS;  Service: Urology;  Laterality: N/A;   Past Medical History:  Diagnosis Date   Arthritis    Bil hands   Bladder prolapse, female, acquired    Breast mass    left   Chronic headaches    Coronary artery disease    Fibromyalgia    GERD (gastroesophageal reflux disease)    Glaucoma    Hyperlipemia    Hypertension    Imbalance    Insomnia    Osteoporosis    Paresthesia    Prediabetes    Vitamin D  deficiency  BP 123/76 (BP Location: Left Arm, Patient Position: Sitting)   Pulse 75   Ht 5' 3 (1.6 m)   Wt 154 lb 9.6 oz (70.1 kg)   SpO2 95%   BMI 27.39 kg/m   Opioid Risk Score:   Fall Risk Score:  `1  Depression screen [facility] 2/9     04/08/2024   11:23 AM 03/18/2024    3:34 PM 04/30/2022    9:14 AM 02/23/2021   11:15 AM 12/29/2020    8:32 AM 11/30/2020    4:32 PM 07/02/2019   11:18 AM  Depression screen PHQ 2/9  Decreased Interest 0 0 3 1 1 2 1   Down, Depressed, Hopeless 0 0 1 0 0 2 1  PHQ - 2 Score 0 0 4 1 1 4 2   Altered sleeping   3 3 3 2 3   Tired, decreased energy   3 3 3 3 3   Change in appetite   3 0 2 2 2   Feeling bad or failure about yourself    0 0 0 1 1  Trouble concentrating   2 1 1  0 3  Moving slowly or fidgety/restless   0 0 0 0 1  Suicidal thoughts   0 0 0 0 0  PHQ-9 Score   15 8 10 12 15     Review of Systems  All other systems reviewed and are negative.      Objective:   Physical Exam  Gen: no distress, normal appearing HEENT: oral mucosa pink and moist, NCAT Cardio: Reg rate Chest: normal effort, normal rate of breathing Abd: soft, non-distended Ext: no edema Psych: pleasant, normal affect Skin: intact Neuro: Alert and oriented x 3. Normal insight and awareness. Intact Memory. Normal language and speech. Cranial nerve exam unremarkable. MMT: 5/5 in all 4 limbs.     Sensory loss below waist line bilaterally 1+/2 to light touch. Proprioception intact, inconsistently in UE's. DTR's 2+ in UE's and 1+ in LE's. Gait sl wide based but normal.  Musculoskeletal: Patient with somewhat of a head forward posture.  Cervical range of motion was normal in all planes although she has some discomfort with rotation to the right side.  Trapezius muscles were somewhat tender and taut.  I did not appreciate any trigger points in her neck or shoulder girdle musculature today.  Shoulder movement was normal bilaterally.  I stood her up and she has some discomfort in her low back.  Muscles were not overly tight in her lumbar spine.  She did have pain with range of motion in all planes.  She had most discomfort with lateral bending to the left and right.  Extension also seem to cause a good amount of pain.  Facet maneuvers were equivocal.  Straight leg raising was negative.  On examination of the joints I saw no obvious joint swelling or abnormalities.  She was tender to palpation throughout from the occiput to cervical paraspinals as well as bilateral trapezius areas, both shoulders and elbows, wrists, low back, greater trochanter areas, bilateral PSIS areas, both ankles.  Interestingly her knees were not overly tender today.     Assessment & Plan:  Chronic, diffuse whole-body pain c/w fibromyalgia syndrome -Poor sleep -Diffuse trigger points -History of migraine headaches -Myofascial pain Severe stenosis at C6-C7-without any obvious sign of cervical radiculopathy on exam.  She does have lower extremity sensory loss without any motor findings and without hyperreflexia Lumbar spondylosis with multiple level degenerative disc disease.  No obvious radiculopathy by imaging or  on exam. Noninflammatory polyarthritis      Plan: We discussed that she needs to increase her exercise in general.  In particular she needs to address aerobic activity.  She likes to go in the pool so this would be  a good place to start now that were in the summer months.  She also has a Research scientist (physical sciences) to [employer].  She can do simple things such as water  walking and some of the classes that they offer.  Other options would be stationary bike and simple walking around her neighborhood.  I recommended a least 30 minutes 3 days a week but ideally in relief for her pain needs, she needs to exercise 5 days a week 30 to 45 minutes in duration. She is on some medications already appropriate for treating fibromyalgia.  I will go ahead and increase her amitriptyline  to 50 mg at bedtime to help with sleep and her fibromyalgia related pain Increase gabapentin  in about a week to 600 mg 3 times daily after titration Consider muscle relaxant although I did not want to add too much today from a neuro sedating standpoint Continue Voltaren  gel to hands and affected joints Consider revisiting physical therapy I preached vigilance regarding her cervical stenosis.  Apparently neurosurgery recommended surgery and she is hesitant to pursue this.  Her exam today appears benign enough that she is okay for the time being.  However she needs to be very attuned to any changes in her motor and sensory function.  If she experiences any diminishment there, she needs to contact the neurosurgeon  I spent about an hour today with this patient reviewing records, discussing the case, performing examination and coming up with a custom treatment plan.  All questions were encouraged and answered.  I will see her back in about 2 months time.  All information was provided to the interpreter who relayed this to the patient herself.  She seemed to understand everything today.

## 2024-04-09 DIAGNOSIS — I10 Essential (primary) hypertension: Secondary | ICD-10-CM | POA: Diagnosis not present

## 2024-04-09 DIAGNOSIS — E559 Vitamin D deficiency, unspecified: Secondary | ICD-10-CM | POA: Diagnosis not present

## 2024-04-09 DIAGNOSIS — R7303 Prediabetes: Secondary | ICD-10-CM | POA: Diagnosis not present

## 2024-04-09 DIAGNOSIS — Z0001 Encounter for general adult medical examination with abnormal findings: Secondary | ICD-10-CM | POA: Diagnosis not present

## 2024-04-09 DIAGNOSIS — L2084 Intrinsic (allergic) eczema: Secondary | ICD-10-CM | POA: Diagnosis not present

## 2024-04-09 DIAGNOSIS — M797 Fibromyalgia: Secondary | ICD-10-CM | POA: Diagnosis not present

## 2024-04-09 DIAGNOSIS — R768 Other specified abnormal immunological findings in serum: Secondary | ICD-10-CM | POA: Diagnosis not present

## 2024-04-09 DIAGNOSIS — E782 Mixed hyperlipidemia: Secondary | ICD-10-CM | POA: Diagnosis not present

## 2024-04-09 DIAGNOSIS — G43111 Migraine with aura, intractable, with status migrainosus: Secondary | ICD-10-CM | POA: Diagnosis not present

## 2024-04-09 DIAGNOSIS — K219 Gastro-esophageal reflux disease without esophagitis: Secondary | ICD-10-CM | POA: Diagnosis not present

## 2024-05-21 DIAGNOSIS — G43111 Migraine with aura, intractable, with status migrainosus: Secondary | ICD-10-CM | POA: Diagnosis not present

## 2024-05-21 DIAGNOSIS — L2084 Intrinsic (allergic) eczema: Secondary | ICD-10-CM | POA: Diagnosis not present

## 2024-05-21 DIAGNOSIS — E782 Mixed hyperlipidemia: Secondary | ICD-10-CM | POA: Diagnosis not present

## 2024-05-21 DIAGNOSIS — M797 Fibromyalgia: Secondary | ICD-10-CM | POA: Diagnosis not present

## 2024-05-21 DIAGNOSIS — I10 Essential (primary) hypertension: Secondary | ICD-10-CM | POA: Diagnosis not present

## 2024-05-21 DIAGNOSIS — K219 Gastro-esophageal reflux disease without esophagitis: Secondary | ICD-10-CM | POA: Diagnosis not present

## 2024-05-21 DIAGNOSIS — R768 Other specified abnormal immunological findings in serum: Secondary | ICD-10-CM | POA: Diagnosis not present

## 2024-05-21 DIAGNOSIS — R7303 Prediabetes: Secondary | ICD-10-CM | POA: Diagnosis not present

## 2024-05-21 DIAGNOSIS — E559 Vitamin D deficiency, unspecified: Secondary | ICD-10-CM | POA: Diagnosis not present

## 2024-06-10 ENCOUNTER — Encounter: Payer: Self-pay | Admitting: Physical Medicine & Rehabilitation

## 2024-06-10 ENCOUNTER — Encounter: Attending: Physical Medicine & Rehabilitation | Admitting: Physical Medicine & Rehabilitation

## 2024-06-10 VITALS — BP 146/91 | HR 78 | Ht 63.0 in | Wt 153.4 lb

## 2024-06-10 DIAGNOSIS — M7918 Myalgia, other site: Secondary | ICD-10-CM | POA: Diagnosis not present

## 2024-06-10 DIAGNOSIS — M797 Fibromyalgia: Secondary | ICD-10-CM | POA: Insufficient documentation

## 2024-06-10 DIAGNOSIS — M5416 Radiculopathy, lumbar region: Secondary | ICD-10-CM | POA: Insufficient documentation

## 2024-06-10 MED ORDER — METHOCARBAMOL 500 MG PO TABS
500.0000 mg | ORAL_TABLET | Freq: Four times a day (QID) | ORAL | 2 refills | Status: AC | PRN
Start: 2024-06-10 — End: ?

## 2024-06-10 NOTE — Patient Instructions (Signed)
 ALWAYS FEEL FREE TO CALL OUR OFFICE WITH ANY PROBLEMS OR QUESTIONS [phone])  **PLEASE NOTE** ALL MEDICATION REFILL REQUESTS (INCLUDING CONTROLLED SUBSTANCES) NEED TO BE MADE AT LEAST 7 DAYS PRIOR TO REFILL BEING DUE. ANY REFILL REQUESTS INSIDE THAT TIME FRAME MAY RESULT IN DELAYS IN RECEIVING YOUR PRESCRIPTION.

## 2024-06-10 NOTE — Progress Notes (Signed)
 Subjective:    Patient ID: Amy Cline, female    DOB: 02-04-1956, 68 y.o.   MRN: [id number]  HPI  Amy Cline is here in follow up of her FMS and chronic pain. She has been active going to [employer], swimming, doing pilates.  Her sleep is fair. She is getting 6-7 hours per night most night. She often sleeps on her side. She feels cold sensation in her shoulders at times and tingling in her hands when she wakes up in the moring. It goes away when she wakes up and gets moving. She doesn't notice any duing the day.   At last visit we increased her elavil  and gabapentin  to 50mg  and 600mg  tid which seemed to help her generalized pain and to an extent with sleep. She feels that the pain is more severe in her low back when she lies down and is better when she moves around.   Her last MRI of the lumbar spine was from 2021 which demonstrated disc disease at multiple levels with extrusion. Surgery was recommended. She had an ES injection  right translaminar at [facility] on 12/27/20.  She may have had other injections as well. She's also had therapy as well without much benefit.   MRI from 10/21:    Narrative & Impression  CLINICAL DATA:  Low back pain.  Rule out disc herniation.   EXAM: MRI LUMBAR SPINE WITHOUT CONTRAST   TECHNIQUE: Multiplanar, multisequence MR imaging of the lumbar spine was performed. No intravenous contrast was administered.   COMPARISON:  06/22/2020 lumbar spine radiographs and prior.   FINDINGS: Segmentation:  Standard.   Alignment: Straightening of lordosis. Stepwise grade 1 retrolisthesis at the L1-2 and L2-3 levels. Minimal grade 1 L4-5 anterolisthesis.   Vertebrae: Normal bone marrow signal intensity. No focal osseous lesions.   Conus medullaris and cauda equina: Conus extends to the L2 level. Conus and cauda equina appear normal.   Disc levels: Multilevel desiccation and disc space loss most prominent at the L2-3 level.   L1-2: Disc bulge and  bilateral facet degenerative spurring. Superimposed left paracentral inferiorly migrated extrusion partially effacing the ventral CSF containing spaces. Mild spinal canal narrowing. Patent neural foramen.   L2-3: Disc bulge, ligamentum flavum thickening and bilateral facet hypertrophy. Superimposed inferiorly migrated central extrusion partially effacing the ventral CSF containing spaces. Mild spinal canal and bilateral neural foraminal narrowing.   L3-4: Mild disc bulge, prominent ligamentum flavum and bilateral facet hypertrophy. Patent spinal canal and neural foramen.   L4-5: Disc bulge, ligamentum flavum and bilateral facet hypertrophy. Mild spinal canal and bilateral neural foraminal narrowing.   L5-S1: Disc bulge with shallow central protrusion. Left paracentral annular fissuring. Ligamentum flavum and bilateral facet hypertrophy. Patent spinal canal and left neural foramen. Mild right neural foraminal narrowing.    Pain Inventory Average Pain 10 Pain Right Now 8 My pain is tingling and aching  In the last 24 hours, has pain interfered with the following? General activity 8 Relation with others 7 Enjoyment of life 10 What TIME of day is your pain at its worst? morning  and night Sleep (in general) NA  Pain is worse with: walking, sitting, and inactivity Pain improves with: heat/ice and pacing activities Relief from Meds: n/a  Family History  Problem Relation Age of Onset   Hypertension Mother    Hypertension Father    Migraines Neg Hx    Social History   Socioeconomic History   Marital status: Legally Separated    Spouse  name: Debby   Number of children: 2   Years of education: 16   Highest education level: Not on file  Occupational History   Occupation: Retired  Tobacco Use   Smoking status: Former    Types: Cigarettes    Passive exposure: Past   Smokeless tobacco: Never  Vaping Use   Vaping status: Never Used  Substance and Sexual Activity    Alcohol use: Not Currently    Comment: SOCIAL   Drug use: No   Sexual activity: Yes    Birth control/protection: Surgical  Other Topics Concern   Not on file  Social History Narrative   Lives at home alone   Caffeine use: 1 cup coffee in AM, sometimes 1 decaf cup in afternoon. Green tea.    Ambidextrous   Social Drivers of Corporate investment banker Strain: Low Risk  (03/18/2024)   Overall Financial Resource Strain (CARDIA)    Difficulty of Paying Living Expenses: Not hard at all  Food Insecurity: No Food Insecurity (03/18/2024)   Hunger Vital Sign    Worried About Running Out of Food in the Last Year: Never true    Ran Out of Food in the Last Year: Never true  Transportation Needs: No Transportation Needs (04/30/2022)   PRAPARE - Administrator, Civil Service (Medical): No    Lack of Transportation (Non-Medical): No  Physical Activity: Unknown (03/18/2024)   Exercise Vital Sign    Days of Exercise per Week: 7 days    Minutes of Exercise per Session: Not on file  Stress: Not on file  Social Connections: Socially Isolated (03/18/2024)   Social Connection and Isolation Panel    Frequency of Communication with Friends and Family: More than three times a week    Frequency of Social Gatherings with Friends and Family: Twice a week    Attends Religious Services: Never    Database administrator or Organizations: No    Attends Banker Meetings: Never    Marital Status: Separated   Past Surgical History:  Procedure Laterality Date   APPENDECTOMY     open. age 38   ARTERY BIOPSY Right 09/16/2018   Procedure: BIOPSY TEMPORAL ARTERY RIGHT;  Surgeon: Sheree Penne Bruckner, MD;  Location: [facility] OR;  Service: Vascular;  Laterality: Right;   BREAST BIOPSY     benign/left breast   COLONOSCOPY  07/2018   OVARIAN CYST SURGERY Right    in her 30s. cyst removal. ovary left   TOTAL VAGINAL HYSTERECTOMY  2008   for prolapse. in [place]   VAGINAL PROLAPSE REPAIR N/A  12/22/2019   Procedure: RECTOCELE REPAIR WITH GRAFT AND VAGINAL VAULT SUSPENSION/CYSTOSCOPY;  Surgeon: Gaston Hamilton, MD;  Location: [facility] ORS;  Service: Urology;  Laterality: N/A;   Past Surgical History:  Procedure Laterality Date   APPENDECTOMY     open. age 43   ARTERY BIOPSY Right 09/16/2018   Procedure: BIOPSY TEMPORAL ARTERY RIGHT;  Surgeon: Sheree Penne Bruckner, MD;  Location: [facility] OR;  Service: Vascular;  Laterality: Right;   BREAST BIOPSY     benign/left breast   COLONOSCOPY  07/2018   OVARIAN CYST SURGERY Right    in her 30s. cyst removal. ovary left   TOTAL VAGINAL HYSTERECTOMY  2008   for prolapse. in [place]   VAGINAL PROLAPSE REPAIR N/A 12/22/2019   Procedure: RECTOCELE REPAIR WITH GRAFT AND VAGINAL VAULT SUSPENSION/CYSTOSCOPY;  Surgeon: Gaston Hamilton, MD;  Location: [facility] ORS;  Service: Urology;  Laterality: N/A;  Past Medical History:  Diagnosis Date   Arthritis    Bil hands   Bladder prolapse, female, acquired    Breast mass    left   Chronic headaches    Coronary artery disease    Fibromyalgia    GERD (gastroesophageal reflux disease)    Glaucoma    Hyperlipemia    Hypertension    Imbalance    Insomnia    Osteoporosis    Paresthesia    Prediabetes    Vitamin D  deficiency    BP (!) 146/91   Pulse 78   Ht 5' 3 (1.6 m)   Wt 153 lb 6.4 oz (69.6 kg)   SpO2 98%   BMI 27.17 kg/m   Opioid Risk Score:   Fall Risk Score:  `1  Depression screen [facility] 2/9     06/10/2024   11:29 AM 04/08/2024   11:23 AM 03/18/2024    3:34 PM 04/30/2022    9:14 AM 02/23/2021   11:15 AM 12/29/2020    8:32 AM 11/30/2020    4:32 PM  Depression screen PHQ 2/9  Decreased Interest 0 0 0 3 1 1 2   Down, Depressed, Hopeless 0 0 0 1 0 0 2  PHQ - 2 Score 0 0 0 4 1 1 4   Altered sleeping    3 3 3 2   Tired, decreased energy    3 3 3 3   Change in appetite    3 0 2 2  Feeling bad or failure about yourself     0 0 0 1  Trouble concentrating    2 1 1  0  Moving slowly or  fidgety/restless    0 0 0 0  Suicidal thoughts    0 0 0 0  PHQ-9 Score    15 8 10 12      Review of Systems  Musculoskeletal:        Multiple joint pain  All other systems reviewed and are negative.      Objective:   Physical Exam  Gen: no distress, normal appearing HEENT: oral mucosa pink and moist, NCAT Cardio: Reg rate Chest: normal effort, normal rate of breathing Abd: soft, non-distended Ext: no edema Psych: pleasant, normal affect Skin: intact Neuro: Alert and oriented x 3. Normal insight and awareness. Intact Memory. Normal language and speech. Cranial nerve exam unremarkable. MMT: 5/5 in all 4 limbs.    Sensory loss below waist line bilaterally 1+/2 to light touch right side more than left.. Proprioception intact, inconsistently in UE's. DTR's 2+ in UE's and 1+ in LE's. Musculoskeletal: GENERAL tightness and tender points. Is moving her upper half better. Low back is VERY tender to palpation.  Straight leg raising and seated slump test are positive right more than left.  She is limited with lumbar flexion more than extension although both cause pain.  She also had pain with rotation to either side.        ASSESSMENT AND PLAN:  Chronic, diffuse whole-body pain c/w fibromyalgia syndrome -Poor sleep--improved -Diffuse trigger points -History of migraine headaches -Myofascial pain Severe stenosis at C6-C7-without any obvious sign of cervical radiculopathy on exam.  She does have lower extremity sensory loss without any motor findings and without hyperreflexia Lumbar spondylosis with multiple level degenerative disc disease.  Has had progressive leg symptom, right greater than left consistent with radiculopathy. Her main complaint is back pain at this point.  Noninflammatory polyarthritis            Plan: Continue with  general exercise that she's doing, pool, pilates, [employer], etc. reassured her that tingling in her hands is likely related to her positions at night when  she sleeps.  She will continue to monitor for now. Same elavil  50mg  qhs I  gabapentin    600 mg 3 times daily  Add muscle relaxant for back pain, robaxin  500mg  q6 prn  Continue Voltaren  gel to hands and affected joints Repeat MRI lumbar spine as her symptoms are progressing Consider injection or surgical consult based on MRI results  Twenty minutes of face to face patient care time were spent during this visit. All questions were encouraged and answered.  Follow up with me in 6 .

## 2024-07-09 ENCOUNTER — Ambulatory Visit (HOSPITAL_BASED_OUTPATIENT_CLINIC_OR_DEPARTMENT_OTHER): Admitting: Nurse Practitioner

## 2024-08-12 ENCOUNTER — Encounter: Attending: Physical Medicine & Rehabilitation | Admitting: Physical Medicine & Rehabilitation

## 2024-08-12 DIAGNOSIS — M797 Fibromyalgia: Secondary | ICD-10-CM | POA: Insufficient documentation

## 2024-08-12 DIAGNOSIS — M7918 Myalgia, other site: Secondary | ICD-10-CM | POA: Insufficient documentation

## 2024-08-12 DIAGNOSIS — M5416 Radiculopathy, lumbar region: Secondary | ICD-10-CM | POA: Insufficient documentation
